# Patient Record
Sex: Male | Born: 1935 | Race: White | Hispanic: No | Marital: Married | Smoking: Former smoker
Health system: Southern US, Community
[De-identification: ages and names within clinical notes are randomized; demographics above are authoritative.]

## PROBLEM LIST (undated history)

## (undated) DIAGNOSIS — C8590 Non-Hodgkin lymphoma, unspecified, unspecified site: Secondary | ICD-10-CM

## (undated) DIAGNOSIS — M1990 Unspecified osteoarthritis, unspecified site: Secondary | ICD-10-CM

## (undated) DIAGNOSIS — I10 Essential (primary) hypertension: Secondary | ICD-10-CM

## (undated) DIAGNOSIS — Z85118 Personal history of other malignant neoplasm of bronchus and lung: Secondary | ICD-10-CM

## (undated) DIAGNOSIS — G459 Transient cerebral ischemic attack, unspecified: Secondary | ICD-10-CM

## (undated) DIAGNOSIS — I4891 Unspecified atrial fibrillation: Secondary | ICD-10-CM

## (undated) HISTORY — PX: HERNIA REPAIR: SHX51

## (undated) HISTORY — PX: BACK SURGERY: SHX140

## (undated) HISTORY — PX: REPLACEMENT TOTAL KNEE BILATERAL: SUR1225

## (undated) HISTORY — PX: CHOLECYSTECTOMY: SHX55

## (undated) HISTORY — PX: ROTATOR CUFF REPAIR: SHX139

## (undated) HISTORY — PX: PNEUMONECTOMY: SHX168

---

## 1998-12-01 ENCOUNTER — Other Ambulatory Visit: Admission: RE | Admit: 1998-12-01 | Discharge: 1998-12-01 | Payer: Self-pay | Admitting: Orthopedic Surgery

## 2001-09-05 ENCOUNTER — Encounter: Admission: RE | Admit: 2001-09-05 | Discharge: 2001-09-05 | Payer: Self-pay | Admitting: Gastroenterology

## 2001-09-05 ENCOUNTER — Encounter: Payer: Self-pay | Admitting: Gastroenterology

## 2003-07-08 ENCOUNTER — Encounter: Payer: Self-pay | Admitting: Gastroenterology

## 2003-07-08 ENCOUNTER — Inpatient Hospital Stay (HOSPITAL_COMMUNITY): Admission: AD | Admit: 2003-07-08 | Discharge: 2003-07-10 | Payer: Self-pay | Admitting: Gastroenterology

## 2004-06-25 ENCOUNTER — Encounter: Admission: RE | Admit: 2004-06-25 | Discharge: 2004-06-25 | Payer: Self-pay | Admitting: Gastroenterology

## 2005-03-08 ENCOUNTER — Encounter: Admission: RE | Admit: 2005-03-08 | Discharge: 2005-03-08 | Payer: Self-pay | Admitting: Internal Medicine

## 2005-04-26 ENCOUNTER — Encounter: Admission: RE | Admit: 2005-04-26 | Discharge: 2005-04-26 | Payer: Self-pay | Admitting: Internal Medicine

## 2005-05-05 ENCOUNTER — Ambulatory Visit (HOSPITAL_COMMUNITY): Admission: RE | Admit: 2005-05-05 | Discharge: 2005-05-05 | Payer: Self-pay | Admitting: General Surgery

## 2005-05-05 ENCOUNTER — Encounter (INDEPENDENT_AMBULATORY_CARE_PROVIDER_SITE_OTHER): Payer: Self-pay | Admitting: General Surgery

## 2005-05-05 ENCOUNTER — Encounter (INDEPENDENT_AMBULATORY_CARE_PROVIDER_SITE_OTHER): Payer: Self-pay | Admitting: *Deleted

## 2005-05-23 ENCOUNTER — Encounter (INDEPENDENT_AMBULATORY_CARE_PROVIDER_SITE_OTHER): Payer: Self-pay | Admitting: General Surgery

## 2005-05-24 ENCOUNTER — Inpatient Hospital Stay (HOSPITAL_COMMUNITY): Admission: RE | Admit: 2005-05-24 | Discharge: 2005-05-31 | Payer: Self-pay | Admitting: General Surgery

## 2005-05-24 ENCOUNTER — Encounter (INDEPENDENT_AMBULATORY_CARE_PROVIDER_SITE_OTHER): Payer: Self-pay | Admitting: *Deleted

## 2005-05-25 ENCOUNTER — Encounter (INDEPENDENT_AMBULATORY_CARE_PROVIDER_SITE_OTHER): Payer: Self-pay | Admitting: Cardiology

## 2005-05-29 ENCOUNTER — Ambulatory Visit: Payer: Self-pay | Admitting: Internal Medicine

## 2005-06-03 ENCOUNTER — Ambulatory Visit: Payer: Self-pay | Admitting: Hematology & Oncology

## 2005-07-26 ENCOUNTER — Ambulatory Visit: Payer: Self-pay | Admitting: Hematology & Oncology

## 2005-07-28 ENCOUNTER — Encounter: Admission: RE | Admit: 2005-07-28 | Discharge: 2005-07-28 | Payer: Self-pay | Admitting: Gastroenterology

## 2005-08-12 ENCOUNTER — Ambulatory Visit (HOSPITAL_COMMUNITY): Admission: RE | Admit: 2005-08-12 | Discharge: 2005-08-12 | Payer: Self-pay | Admitting: Hematology & Oncology

## 2005-08-18 ENCOUNTER — Other Ambulatory Visit: Admission: RE | Admit: 2005-08-18 | Discharge: 2005-08-18 | Payer: Self-pay | Admitting: Hematology & Oncology

## 2005-08-18 ENCOUNTER — Encounter: Payer: Self-pay | Admitting: Hematology & Oncology

## 2005-09-09 ENCOUNTER — Ambulatory Visit (HOSPITAL_COMMUNITY): Admission: RE | Admit: 2005-09-09 | Discharge: 2005-09-09 | Payer: Self-pay | Admitting: Hematology & Oncology

## 2005-09-10 ENCOUNTER — Ambulatory Visit: Payer: Self-pay | Admitting: Hematology & Oncology

## 2005-11-04 ENCOUNTER — Encounter (INDEPENDENT_AMBULATORY_CARE_PROVIDER_SITE_OTHER): Payer: Self-pay | Admitting: *Deleted

## 2005-11-04 ENCOUNTER — Ambulatory Visit (HOSPITAL_COMMUNITY): Admission: RE | Admit: 2005-11-04 | Discharge: 2005-11-04 | Payer: Self-pay | Admitting: Hematology & Oncology

## 2005-11-05 ENCOUNTER — Ambulatory Visit (HOSPITAL_COMMUNITY): Admission: RE | Admit: 2005-11-05 | Discharge: 2005-11-05 | Payer: Self-pay | Admitting: Hematology & Oncology

## 2005-11-11 ENCOUNTER — Ambulatory Visit: Payer: Self-pay | Admitting: Hematology & Oncology

## 2005-12-21 ENCOUNTER — Emergency Department (HOSPITAL_COMMUNITY): Admission: EM | Admit: 2005-12-21 | Discharge: 2005-12-22 | Payer: Self-pay | Admitting: Emergency Medicine

## 2005-12-30 ENCOUNTER — Ambulatory Visit: Payer: Self-pay | Admitting: Hematology & Oncology

## 2006-01-05 ENCOUNTER — Ambulatory Visit (HOSPITAL_COMMUNITY): Admission: RE | Admit: 2006-01-05 | Discharge: 2006-01-05 | Payer: Self-pay | Admitting: Hematology & Oncology

## 2006-01-11 ENCOUNTER — Encounter (INDEPENDENT_AMBULATORY_CARE_PROVIDER_SITE_OTHER): Payer: Self-pay | Admitting: *Deleted

## 2006-01-11 ENCOUNTER — Ambulatory Visit (HOSPITAL_COMMUNITY): Admission: RE | Admit: 2006-01-11 | Discharge: 2006-01-11 | Payer: Self-pay | Admitting: Hematology & Oncology

## 2006-01-11 ENCOUNTER — Ambulatory Visit: Payer: Self-pay | Admitting: Hematology & Oncology

## 2006-02-10 LAB — COMPREHENSIVE METABOLIC PANEL
ALT: 10 U/L (ref 0–40)
BUN: 8 mg/dL (ref 6–23)
CO2: 30 mEq/L (ref 19–32)
Calcium: 9.3 mg/dL (ref 8.4–10.5)
Chloride: 104 mEq/L (ref 96–112)
Creatinine, Ser: 0.6 mg/dL (ref 0.4–1.5)
Glucose, Bld: 99 mg/dL (ref 70–99)
Total Bilirubin: 0.7 mg/dL (ref 0.3–1.2)

## 2006-02-10 LAB — CBC WITH DIFFERENTIAL/PLATELET
Eosinophils Absolute: 0.2 10*3/uL (ref 0.0–0.5)
MONO#: 0.5 10*3/uL (ref 0.1–0.9)
MONO%: 12.3 % (ref 0.0–13.0)
NEUT#: 2.3 10*3/uL (ref 1.5–6.5)
RBC: 4.01 10*6/uL — ABNORMAL LOW (ref 4.20–5.71)
RDW: 17 % — ABNORMAL HIGH (ref 11.2–14.6)
WBC: 3.9 10*3/uL — ABNORMAL LOW (ref 4.0–10.0)
lymph#: 0.9 10*3/uL (ref 0.9–3.3)

## 2006-02-10 LAB — ERYTHROCYTE SEDIMENTATION RATE: Sed Rate: 36 mm/hr — ABNORMAL HIGH (ref 0–20)

## 2006-02-10 LAB — CHCC SMEAR

## 2006-02-10 LAB — LACTATE DEHYDROGENASE: LDH: 147 U/L (ref 94–250)

## 2006-03-08 ENCOUNTER — Ambulatory Visit: Payer: Self-pay | Admitting: Hematology & Oncology

## 2006-03-24 LAB — COMPREHENSIVE METABOLIC PANEL
Alkaline Phosphatase: 78 U/L (ref 39–117)
CO2: 29 mEq/L (ref 19–32)
Creatinine, Ser: 0.7 mg/dL (ref 0.4–1.5)
Glucose, Bld: 106 mg/dL — ABNORMAL HIGH (ref 70–99)
Total Bilirubin: 0.5 mg/dL (ref 0.3–1.2)

## 2006-03-24 LAB — CBC WITH DIFFERENTIAL/PLATELET
Basophils Absolute: 0 10*3/uL (ref 0.0–0.1)
EOS%: 4.6 % (ref 0.0–7.0)
HCT: 37.8 % — ABNORMAL LOW (ref 38.7–49.9)
HGB: 12.9 g/dL — ABNORMAL LOW (ref 13.0–17.1)
MCH: 31.3 pg (ref 28.0–33.4)
MONO#: 0.5 10*3/uL (ref 0.1–0.9)
NEUT#: 1.2 10*3/uL — ABNORMAL LOW (ref 1.5–6.5)
RDW: 16 % — ABNORMAL HIGH (ref 11.2–14.6)
WBC: 2.7 10*3/uL — ABNORMAL LOW (ref 4.0–10.0)
lymph#: 0.9 10*3/uL (ref 0.9–3.3)

## 2006-03-24 LAB — LACTATE DEHYDROGENASE: LDH: 153 U/L (ref 94–250)

## 2006-04-07 ENCOUNTER — Ambulatory Visit (HOSPITAL_COMMUNITY): Admission: RE | Admit: 2006-04-07 | Discharge: 2006-04-07 | Payer: Self-pay | Admitting: Hematology & Oncology

## 2006-04-22 LAB — CBC WITH DIFFERENTIAL/PLATELET
Basophils Absolute: 0 10*3/uL (ref 0.0–0.1)
Eosinophils Absolute: 0.2 10*3/uL (ref 0.0–0.5)
HCT: 42.2 % (ref 38.7–49.9)
LYMPH%: 18.1 % (ref 14.0–48.0)
MCV: 94.1 fL (ref 81.6–98.0)
MONO#: 0.5 10*3/uL (ref 0.1–0.9)
MONO%: 8.8 % (ref 0.0–13.0)
NEUT#: 3.7 10*3/uL (ref 1.5–6.5)
NEUT%: 69.1 % (ref 40.0–75.0)
Platelets: 166 10*3/uL (ref 145–400)
WBC: 5.4 10*3/uL (ref 4.0–10.0)

## 2006-04-22 LAB — COMPREHENSIVE METABOLIC PANEL
ALT: 8 U/L (ref 0–40)
BUN: 8 mg/dL (ref 6–23)
CO2: 29 mEq/L (ref 19–32)
Calcium: 8.8 mg/dL (ref 8.4–10.5)
Chloride: 104 mEq/L (ref 96–112)
Creatinine, Ser: 0.73 mg/dL (ref 0.40–1.50)
Glucose, Bld: 108 mg/dL — ABNORMAL HIGH (ref 70–99)
Total Bilirubin: 0.6 mg/dL (ref 0.3–1.2)

## 2006-04-22 LAB — LACTATE DEHYDROGENASE: LDH: 145 U/L (ref 94–250)

## 2006-05-02 ENCOUNTER — Ambulatory Visit: Payer: Self-pay | Admitting: Hematology & Oncology

## 2006-06-03 LAB — COMPREHENSIVE METABOLIC PANEL
ALT: 10 U/L (ref 0–40)
BUN: 10 mg/dL (ref 6–23)
CO2: 28 mEq/L (ref 19–32)
Calcium: 8.7 mg/dL (ref 8.4–10.5)
Chloride: 105 mEq/L (ref 96–112)
Creatinine, Ser: 0.79 mg/dL (ref 0.40–1.50)
Total Bilirubin: 0.6 mg/dL (ref 0.3–1.2)

## 2006-06-03 LAB — LACTATE DEHYDROGENASE: LDH: 218 U/L (ref 94–250)

## 2006-06-03 LAB — CBC WITH DIFFERENTIAL/PLATELET
BASO%: 0.4 % (ref 0.0–2.0)
Basophils Absolute: 0 10*3/uL (ref 0.0–0.1)
HCT: 40.4 % (ref 38.7–49.9)
HGB: 13.9 g/dL (ref 13.0–17.1)
LYMPH%: 41.2 % (ref 14.0–48.0)
MCH: 31.7 pg (ref 28.0–33.4)
MCHC: 34.3 g/dL (ref 32.0–35.9)
MONO#: 0.5 10*3/uL (ref 0.1–0.9)
NEUT%: 33.6 % — ABNORMAL LOW (ref 40.0–75.0)
Platelets: 156 10*3/uL (ref 145–400)
WBC: 2.6 10*3/uL — ABNORMAL LOW (ref 4.0–10.0)

## 2006-06-24 ENCOUNTER — Ambulatory Visit: Payer: Self-pay | Admitting: Hematology & Oncology

## 2006-07-15 ENCOUNTER — Ambulatory Visit (HOSPITAL_COMMUNITY): Admission: RE | Admit: 2006-07-15 | Discharge: 2006-07-15 | Payer: Self-pay | Admitting: Hematology & Oncology

## 2006-07-29 LAB — COMPREHENSIVE METABOLIC PANEL
ALT: 10 U/L (ref 0–40)
Albumin: 4.5 g/dL (ref 3.5–5.2)
CO2: 30 mEq/L (ref 19–32)
Calcium: 9.2 mg/dL (ref 8.4–10.5)
Chloride: 104 mEq/L (ref 96–112)
Glucose, Bld: 98 mg/dL (ref 70–99)
Potassium: 4.3 mEq/L (ref 3.5–5.3)
Sodium: 142 mEq/L (ref 135–145)
Total Bilirubin: 0.7 mg/dL (ref 0.3–1.2)
Total Protein: 7 g/dL (ref 6.0–8.3)

## 2006-07-29 LAB — CBC WITH DIFFERENTIAL/PLATELET
BASO%: 0.1 % (ref 0.0–2.0)
Eosinophils Absolute: 0.2 10*3/uL (ref 0.0–0.5)
LYMPH%: 23.1 % (ref 14.0–48.0)
MONO#: 0.6 10*3/uL (ref 0.1–0.9)
NEUT#: 3.4 10*3/uL (ref 1.5–6.5)
Platelets: 168 10*3/uL (ref 145–400)
RBC: 4.47 10*6/uL (ref 4.20–5.71)
WBC: 5.4 10*3/uL (ref 4.0–10.0)
lymph#: 1.2 10*3/uL (ref 0.9–3.3)

## 2006-08-09 ENCOUNTER — Ambulatory Visit: Payer: Self-pay | Admitting: Hematology & Oncology

## 2006-09-20 ENCOUNTER — Ambulatory Visit: Payer: Self-pay | Admitting: Hematology & Oncology

## 2006-10-27 ENCOUNTER — Ambulatory Visit (HOSPITAL_COMMUNITY): Admission: RE | Admit: 2006-10-27 | Discharge: 2006-10-27 | Payer: Self-pay | Admitting: Hematology & Oncology

## 2006-10-31 ENCOUNTER — Ambulatory Visit: Payer: Self-pay | Admitting: Hematology & Oncology

## 2006-11-03 LAB — CBC WITH DIFFERENTIAL/PLATELET
BASO%: 0.2 % (ref 0.0–2.0)
Basophils Absolute: 0 10*3/uL (ref 0.0–0.1)
HCT: 43.2 % (ref 38.7–49.9)
HGB: 14.8 g/dL (ref 13.0–17.1)
MCHC: 34.2 g/dL (ref 32.0–35.9)
MONO#: 0.5 10*3/uL (ref 0.1–0.9)
NEUT%: 62.3 % (ref 40.0–75.0)
RDW: 13.9 % (ref 11.2–14.6)
WBC: 5.4 10*3/uL (ref 4.0–10.0)
lymph#: 1.4 10*3/uL (ref 0.9–3.3)

## 2006-11-03 LAB — LACTATE DEHYDROGENASE: LDH: 173 U/L (ref 94–250)

## 2006-12-13 ENCOUNTER — Ambulatory Visit: Payer: Self-pay | Admitting: Hematology & Oncology

## 2007-01-31 ENCOUNTER — Ambulatory Visit: Payer: Self-pay | Admitting: Hematology & Oncology

## 2007-02-23 ENCOUNTER — Ambulatory Visit (HOSPITAL_COMMUNITY): Admission: RE | Admit: 2007-02-23 | Discharge: 2007-02-23 | Payer: Self-pay | Admitting: Hematology & Oncology

## 2007-03-09 LAB — CBC WITH DIFFERENTIAL/PLATELET
Basophils Absolute: 0 10*3/uL (ref 0.0–0.1)
EOS%: 2.7 % (ref 0.0–7.0)
Eosinophils Absolute: 0.2 10*3/uL (ref 0.0–0.5)
HCT: 39.7 % (ref 38.7–49.9)
HGB: 14.2 g/dL (ref 13.0–17.1)
MCH: 33.9 pg — ABNORMAL HIGH (ref 28.0–33.4)
MONO#: 0.5 10*3/uL (ref 0.1–0.9)
NEUT#: 3.6 10*3/uL (ref 1.5–6.5)
NEUT%: 63.3 % (ref 40.0–75.0)
RDW: 13 % (ref 11.2–14.6)
WBC: 5.6 10*3/uL (ref 4.0–10.0)
lymph#: 1.4 10*3/uL (ref 0.9–3.3)

## 2007-03-09 LAB — COMPREHENSIVE METABOLIC PANEL
AST: 28 U/L (ref 0–37)
Albumin: 4.4 g/dL (ref 3.5–5.2)
BUN: 10 mg/dL (ref 6–23)
CO2: 28 mEq/L (ref 19–32)
Calcium: 9.3 mg/dL (ref 8.4–10.5)
Chloride: 104 mEq/L (ref 96–112)
Creatinine, Ser: 0.76 mg/dL (ref 0.40–1.50)
Potassium: 4.2 mEq/L (ref 3.5–5.3)

## 2007-03-09 LAB — LACTATE DEHYDROGENASE: LDH: 162 U/L (ref 94–250)

## 2007-04-18 ENCOUNTER — Ambulatory Visit: Payer: Self-pay | Admitting: Hematology & Oncology

## 2007-08-01 ENCOUNTER — Ambulatory Visit: Payer: Self-pay | Admitting: Hematology & Oncology

## 2007-08-31 ENCOUNTER — Ambulatory Visit (HOSPITAL_COMMUNITY): Admission: RE | Admit: 2007-08-31 | Discharge: 2007-08-31 | Payer: Self-pay | Admitting: Hematology & Oncology

## 2007-09-07 LAB — LACTATE DEHYDROGENASE: LDH: 161 U/L (ref 94–250)

## 2007-09-07 LAB — COMPREHENSIVE METABOLIC PANEL
BUN: 16 mg/dL (ref 6–23)
CO2: 26 mEq/L (ref 19–32)
Calcium: 9.1 mg/dL (ref 8.4–10.5)
Chloride: 105 mEq/L (ref 96–112)
Creatinine, Ser: 0.79 mg/dL (ref 0.40–1.50)
Glucose, Bld: 95 mg/dL (ref 70–99)

## 2007-09-07 LAB — CBC WITH DIFFERENTIAL/PLATELET
Basophils Absolute: 0 10*3/uL (ref 0.0–0.1)
EOS%: 2 % (ref 0.0–7.0)
HCT: 39.7 % (ref 38.7–49.9)
HGB: 13.9 g/dL (ref 13.0–17.1)
MCH: 33.2 pg (ref 28.0–33.4)
MONO#: 0.4 10*3/uL (ref 0.1–0.9)
NEUT%: 55.7 % (ref 40.0–75.0)
lymph#: 1.8 10*3/uL (ref 0.9–3.3)

## 2007-09-07 LAB — TESTOSTERONE: Testosterone: 169.73 ng/dL — ABNORMAL LOW (ref 350–890)

## 2007-09-13 ENCOUNTER — Ambulatory Visit (HOSPITAL_COMMUNITY): Admission: RE | Admit: 2007-09-13 | Discharge: 2007-09-13 | Payer: Self-pay | Admitting: Hematology & Oncology

## 2007-09-25 ENCOUNTER — Encounter: Payer: Self-pay | Admitting: Urology

## 2007-09-25 ENCOUNTER — Inpatient Hospital Stay (HOSPITAL_COMMUNITY): Admission: RE | Admit: 2007-09-25 | Discharge: 2007-09-27 | Payer: Self-pay | Admitting: Urology

## 2008-03-13 ENCOUNTER — Ambulatory Visit (HOSPITAL_COMMUNITY): Admission: RE | Admit: 2008-03-13 | Discharge: 2008-03-13 | Payer: Self-pay | Admitting: Hematology & Oncology

## 2008-03-19 ENCOUNTER — Ambulatory Visit: Payer: Self-pay | Admitting: Hematology & Oncology

## 2008-04-08 ENCOUNTER — Encounter: Admission: RE | Admit: 2008-04-08 | Discharge: 2008-04-08 | Payer: Self-pay | Admitting: Gastroenterology

## 2008-09-16 ENCOUNTER — Ambulatory Visit (HOSPITAL_COMMUNITY): Admission: RE | Admit: 2008-09-16 | Discharge: 2008-09-16 | Payer: Self-pay | Admitting: Gastroenterology

## 2008-09-16 ENCOUNTER — Ambulatory Visit: Payer: Self-pay | Admitting: Vascular Surgery

## 2008-09-16 ENCOUNTER — Encounter (INDEPENDENT_AMBULATORY_CARE_PROVIDER_SITE_OTHER): Payer: Self-pay | Admitting: Gastroenterology

## 2008-09-16 ENCOUNTER — Ambulatory Visit (HOSPITAL_COMMUNITY): Admission: RE | Admit: 2008-09-16 | Discharge: 2008-09-16 | Payer: Self-pay | Admitting: Hematology & Oncology

## 2008-09-18 ENCOUNTER — Ambulatory Visit: Payer: Self-pay | Admitting: Hematology & Oncology

## 2008-09-19 LAB — COMPREHENSIVE METABOLIC PANEL
Alkaline Phosphatase: 43 U/L (ref 39–117)
BUN: 14 mg/dL (ref 6–23)
Glucose, Bld: 82 mg/dL (ref 70–99)
Total Bilirubin: 0.6 mg/dL (ref 0.3–1.2)

## 2008-09-19 LAB — CBC WITH DIFFERENTIAL (CANCER CENTER ONLY)
BASO#: 0 10*3/uL (ref 0.0–0.2)
BASO%: 0.5 % (ref 0.0–2.0)
HCT: 43.3 % (ref 38.7–49.9)
HGB: 14.8 g/dL (ref 13.0–17.1)
LYMPH#: 1.9 10*3/uL (ref 0.9–3.3)
MONO#: 0.5 10*3/uL (ref 0.1–0.9)
NEUT#: 3.5 10*3/uL (ref 1.5–6.5)
NEUT%: 57.8 % (ref 40.0–80.0)
RDW: 12.1 % (ref 10.5–14.6)
WBC: 6.1 10*3/uL (ref 4.0–10.0)

## 2008-09-19 LAB — TESTOSTERONE: Testosterone: 187.12 ng/dL — ABNORMAL LOW (ref 350–890)

## 2008-09-19 LAB — PSA: PSA: 0.5 ng/mL (ref 0.10–4.00)

## 2009-03-10 ENCOUNTER — Ambulatory Visit (HOSPITAL_COMMUNITY): Admission: RE | Admit: 2009-03-10 | Discharge: 2009-03-10 | Payer: Self-pay | Admitting: Hematology & Oncology

## 2009-03-19 ENCOUNTER — Ambulatory Visit: Payer: Self-pay | Admitting: Hematology & Oncology

## 2009-03-20 LAB — COMPREHENSIVE METABOLIC PANEL
ALT: 36 U/L (ref 0–53)
Albumin: 4.2 g/dL (ref 3.5–5.2)
CO2: 28 mEq/L (ref 19–32)
Calcium: 9.1 mg/dL (ref 8.4–10.5)
Chloride: 104 mEq/L (ref 96–112)
Creatinine, Ser: 0.87 mg/dL (ref 0.40–1.50)
Sodium: 140 mEq/L (ref 135–145)
Total Protein: 7 g/dL (ref 6.0–8.3)

## 2009-03-20 LAB — LACTATE DEHYDROGENASE: LDH: 193 U/L (ref 94–250)

## 2009-03-20 LAB — CBC WITH DIFFERENTIAL (CANCER CENTER ONLY)
BASO%: 0.4 % (ref 0.0–2.0)
EOS%: 2.2 % (ref 0.0–7.0)
LYMPH#: 1.9 10*3/uL (ref 0.9–3.3)
MONO#: 0.4 10*3/uL (ref 0.1–0.9)
Platelets: 152 10*3/uL (ref 145–400)
RDW: 11.3 % (ref 10.5–14.6)
WBC: 5.2 10*3/uL (ref 4.0–10.0)

## 2009-06-06 ENCOUNTER — Encounter: Admission: RE | Admit: 2009-06-06 | Discharge: 2009-06-06 | Payer: Self-pay | Admitting: Gastroenterology

## 2009-08-27 ENCOUNTER — Inpatient Hospital Stay (HOSPITAL_COMMUNITY): Admission: EM | Admit: 2009-08-27 | Discharge: 2009-08-28 | Payer: Self-pay | Admitting: Emergency Medicine

## 2009-08-28 ENCOUNTER — Encounter (INDEPENDENT_AMBULATORY_CARE_PROVIDER_SITE_OTHER): Payer: Self-pay | Admitting: Gastroenterology

## 2009-09-15 ENCOUNTER — Ambulatory Visit (HOSPITAL_COMMUNITY): Admission: RE | Admit: 2009-09-15 | Discharge: 2009-09-15 | Payer: Self-pay | Admitting: Hematology & Oncology

## 2009-09-19 ENCOUNTER — Ambulatory Visit: Payer: Self-pay | Admitting: Hematology & Oncology

## 2009-09-22 LAB — CBC WITH DIFFERENTIAL (CANCER CENTER ONLY)
BASO#: 0 10*3/uL (ref 0.0–0.2)
BASO%: 0.4 % (ref 0.0–2.0)
EOS%: 2.2 % (ref 0.0–7.0)
Eosinophils Absolute: 0.1 10*3/uL (ref 0.0–0.5)
HCT: 39.5 % (ref 38.7–49.9)
HGB: 13.8 g/dL (ref 13.0–17.1)
LYMPH#: 1.7 10*3/uL (ref 0.9–3.3)
LYMPH%: 32.5 % (ref 14.0–48.0)
MCH: 32.8 pg (ref 28.0–33.4)
MCHC: 35 g/dL (ref 32.0–35.9)
MCV: 94 fL (ref 82–98)
MONO#: 0.3 10*3/uL (ref 0.1–0.9)
MONO%: 6.4 % (ref 0.0–13.0)
NEUT#: 3 10*3/uL (ref 1.5–6.5)
NEUT%: 58.5 % (ref 40.0–80.0)
Platelets: 155 10*3/uL (ref 145–400)
RBC: 4.21 10*6/uL (ref 4.20–5.70)
RDW: 11.7 % (ref 10.5–14.6)
WBC: 5.1 10*3/uL (ref 4.0–10.0)

## 2009-09-23 LAB — COMPREHENSIVE METABOLIC PANEL
Alkaline Phosphatase: 42 U/L (ref 39–117)
Glucose, Bld: 130 mg/dL — ABNORMAL HIGH (ref 70–99)
Sodium: 141 mEq/L (ref 135–145)
Total Bilirubin: 0.6 mg/dL (ref 0.3–1.2)
Total Protein: 7.1 g/dL (ref 6.0–8.3)

## 2009-09-23 LAB — VITAMIN D 25 HYDROXY (VIT D DEFICIENCY, FRACTURES): Vit D, 25-Hydroxy: 24 ng/mL — ABNORMAL LOW (ref 30–89)

## 2010-04-14 ENCOUNTER — Encounter: Admission: RE | Admit: 2010-04-14 | Discharge: 2010-04-14 | Payer: Self-pay | Admitting: Gastroenterology

## 2010-06-24 ENCOUNTER — Ambulatory Visit: Payer: Self-pay | Admitting: Hematology & Oncology

## 2010-06-25 LAB — CBC WITH DIFFERENTIAL (CANCER CENTER ONLY)
Eosinophils Absolute: 0.1 10*3/uL (ref 0.0–0.5)
HCT: 43.3 % (ref 38.7–49.9)
HGB: 14.6 g/dL (ref 13.0–17.1)
LYMPH#: 2 10*3/uL (ref 0.9–3.3)
LYMPH%: 33.4 % (ref 14.0–48.0)
MCHC: 33.8 g/dL (ref 32.0–35.9)
MCV: 98 fL (ref 82–98)
MONO%: 6.2 % (ref 0.0–13.0)
Platelets: 158 10*3/uL (ref 145–400)
RDW: 11.1 % (ref 10.5–14.6)
WBC: 5.9 10*3/uL (ref 4.0–10.0)

## 2010-06-26 LAB — COMPREHENSIVE METABOLIC PANEL
ALT: 25 U/L (ref 0–53)
AST: 39 U/L — ABNORMAL HIGH (ref 0–37)
Alkaline Phosphatase: 40 U/L (ref 39–117)
Calcium: 9.5 mg/dL (ref 8.4–10.5)
Chloride: 105 mEq/L (ref 96–112)
Creatinine, Ser: 1.06 mg/dL (ref 0.40–1.50)
Glucose, Bld: 124 mg/dL — ABNORMAL HIGH (ref 70–99)
Potassium: 4.6 mEq/L (ref 3.5–5.3)
Total Protein: 7.2 g/dL (ref 6.0–8.3)

## 2010-07-02 ENCOUNTER — Ambulatory Visit (HOSPITAL_COMMUNITY): Admission: RE | Admit: 2010-07-02 | Discharge: 2010-07-02 | Payer: Self-pay | Admitting: Hematology & Oncology

## 2010-11-29 ENCOUNTER — Encounter: Payer: Self-pay | Admitting: Gastroenterology

## 2011-02-10 LAB — GLUCOSE, CAPILLARY: Glucose-Capillary: 105 mg/dL — ABNORMAL HIGH (ref 70–99)

## 2011-02-11 LAB — COMPREHENSIVE METABOLIC PANEL
ALT: 39 U/L (ref 0–53)
AST: 49 U/L — ABNORMAL HIGH (ref 0–37)
Alkaline Phosphatase: 43 U/L (ref 39–117)
CO2: 29 mEq/L (ref 19–32)
Calcium: 8.8 mg/dL (ref 8.4–10.5)
Chloride: 104 mEq/L (ref 96–112)
GFR calc Af Amer: >60 mL/min (ref >60)
GFR calc non Af Amer: >60 mL/min (ref >60)
Potassium: 4 mEq/L (ref 3.5–5.1)
Sodium: 138 mEq/L (ref 135–145)
Total Bilirubin: 1 mg/dL (ref 0.3–1.2)

## 2011-02-11 LAB — BASIC METABOLIC PANEL
Calcium: 9 mg/dL (ref 8.4–10.5)
Creatinine, Ser: 0.82 mg/dL (ref 0.4–1.5)
GFR calc Af Amer: >60 mL/min (ref >60)
GFR calc non Af Amer: >60 mL/min (ref >60)
Glucose, Bld: 103 mg/dL — ABNORMAL HIGH (ref 70–99)
Potassium: 4.3 mEq/L (ref 3.5–5.1)
Sodium: 138 mEq/L (ref 135–145)

## 2011-02-11 LAB — CBC
HCT: 38 % — ABNORMAL LOW (ref 39.0–52.0)
HCT: 38.9 % — ABNORMAL LOW (ref 39.0–52.0)
Hemoglobin: 13.3 g/dL (ref 13.0–17.0)
Hemoglobin: 13.4 g/dL (ref 13.0–17.0)
MCHC: 35.1 g/dL (ref 30.0–36.0)
MCV: 97.1 fL (ref 78.0–100.0)
Platelets: 147 10*3/uL — ABNORMAL LOW (ref 150–400)
RBC: 4.02 MIL/uL — ABNORMAL LOW (ref 4.22–5.81)
WBC: 5.3 10*3/uL (ref 4.0–10.5)
WBC: 5.5 10*3/uL (ref 4.0–10.5)

## 2011-02-11 LAB — LIPID PANEL
Cholesterol: 206 mg/dL — ABNORMAL HIGH (ref 0–200)
HDL: 37 mg/dL — ABNORMAL LOW (ref >39)
Total CHOL/HDL Ratio: 5.6 RATIO

## 2011-02-11 LAB — DIFFERENTIAL
Basophils Relative: 0 % (ref 0–1)
Eosinophils Relative: 2 % (ref 0–5)

## 2011-03-09 ENCOUNTER — Other Ambulatory Visit: Payer: Self-pay | Admitting: Gastroenterology

## 2011-03-09 DIAGNOSIS — G44009 Cluster headache syndrome, unspecified, not intractable: Secondary | ICD-10-CM

## 2011-03-09 DIAGNOSIS — C8590 Non-Hodgkin lymphoma, unspecified, unspecified site: Secondary | ICD-10-CM

## 2011-03-15 ENCOUNTER — Ambulatory Visit
Admission: RE | Admit: 2011-03-15 | Discharge: 2011-03-15 | Disposition: A | Discharge status: Home / Self Care | Payer: Medicare Other | Source: Ambulatory Visit | Attending: Gastroenterology | Admitting: Gastroenterology

## 2011-03-15 DIAGNOSIS — C8590 Non-Hodgkin lymphoma, unspecified, unspecified site: Secondary | ICD-10-CM

## 2011-03-15 DIAGNOSIS — G44009 Cluster headache syndrome, unspecified, not intractable: Secondary | ICD-10-CM

## 2011-03-15 MED ORDER — GADOBENATE DIMEGLUMINE 529 MG/ML IV SOLN
20.0000 mL | Freq: Once | INTRAVENOUS | Status: AC | PRN
  Administered 2011-03-15: 20 mL via INTRAVENOUS

## 2011-03-23 NOTE — Op Note (Signed)
NAMEDANDRE, SISLER NO.:  [id number]   MEDICAL RECORD NO.:  [id number]          PATIENT TYPE:  OIB   LOCATION:  0098                         FACILITY:  [facility]   PHYSICIAN:  Bertram Millard. Dahlstedt, M.D.DATE OF BIRTH:  [birth year]   DATE OF PROCEDURE:  09/25/2007  DATE OF DISCHARGE:                               OPERATIVE REPORT   PREOPERATIVE DIAGNOSIS:  Benign prostatic hypertrophy.   POSTOPERATIVE DIAGNOSIS:  Benign prostatic hypertrophy.   PROCEDURE:  Transurethral prostatic resection.   SURGEON:  Bertram Millard. Dahlstedt, M.D.   ANESTHESIA:  Subarachnoid block.   COMPLICATIONS:  None.   SPECIMENS:  Prostate sent to pathology.   BRIEF HISTORY:  A 77 year old male who presents for TURP for significant  BPH symptoms.  He does not like medications, and symptoms have become so  bad that he desires surgical management.  He has chosen a TURP at my  suggestion over office-based microwave.  He is aware of risks and  complications of the procedure.  He has decided to proceed.   DESCRIPTION OF PROCEDURE:  The patient was identified in the holding  area and preoperative IV antibiotics were administered.  He was given a  spinal block and then placed in the dorsal lithotomy position on the  table.  Genitalia and perineum were prepped and draped.  Urethra was  calibrated to 32 [place] with [employer] sounds.  The resectoscope was  then placed.  The ureteral orifices were identified.  His bladder was  inspected and was normal except for significant trabeculations.  The  median lobe was then taken down first, from the bladder neck to the  verumontanum, leaving the veru as a landmark.  Anterior tissue was then  resected from the 11 o'clock to the 1 o'clock position along the length  of the prostate.  The right and left lateral lobes were then resected  down to the surgical capsule.  A wide open prostatic fossa was then  established.  Small bleeders were electrocoagulated.   Apical tissue was  then trimmed posteriorly.  Again, hemostasis was achieved.  The bladder  was then irrigated and all chips removed.  They were sent for permanent  specimen.  The prostatic fossa was then again inspected.  There was  excellent hemostasis and at this point a 22 [place] three-way Foley  catheter was then placed on traction.  The patient tolerated the  procedure well.  He was then taken to the PACU in stable condition.      Bertram Millard. Dahlstedt, M.D.  Electronically Signed    SMD/MEDQ  D:  09/25/2007  T:  09/25/2007  Job:  [id number]

## 2011-03-23 NOTE — H&P (Signed)
NAMEROI, JAFARI NO.:  [id number]   MEDICAL RECORD NO.:  [id number]          PATIENT TYPE:  OIB   LOCATION:  0098                         FACILITY:  [facility]   PHYSICIAN:  Bertram Millard. Dahlstedt, M.D.DATE OF BIRTH:  [birth year]   DATE OF ADMISSION:  09/25/2007  DATE OF DISCHARGE:                              HISTORY & PHYSICAL   REASON FOR ADMISSION:  Enlarged prostate.   BRIEF HISTORY:  A 77 year old male with significant symptoms of BPH.  His BPH symptom score is well over 20.  He does not want to go with  medical therapy and recently asked my recommendations on procedures for  BPH.  As he does have significant symptomatology, I have recommended  TURP over the less invasive, office based procedures of TUNA and  microwave.  Risks and complications of the TURP have been discussed with  the patient at length.  He desires to proceed.   MEDICATIONS:  He is not currently on any medications.   ALLERGIES:  He is allergic to SULFA.   PAST MEDICAL HISTORY:  1. Significant for atrial fibrillation, not current.  2. History of TIA.  3. Arthritis.  4. Lymphoma.  5. Heart murmur.   PAST SURGICAL HISTORY:  1. Cholecystectomy.  2. Lumbar disc surgery.  3. Bilateral knee arthroplasties.  4. Rotator cuff surgery.  5. Hernia repair.  6. Left pneumonectomy.   SOCIAL HISTORY:  The patient is married.  He has three grown children.  He has a remote history of cigarette use.   REVIEW OF SYSTEMS:  He denies any recent chest pain.  He had mild  depression.  He does have erectile dysfunction.  He has the previously  mentioned urinary symptoms.   FAMILY HISTORY:  Significant for cancer in siblings and hypertension,  otherwise unremarkable.   PHYSICAL EXAMINATION:  GENERAL APPEARANCE:  A pleasant elderly male.  HEENT:  Normal.  NECK:  Supple.  CHEST:  Clear with decreased breath sounds on the left side.  CARDIOVASCULAR:  Regular rate and rhythm.  ABDOMEN:  Flat, soft,  nondistended, nontender.  No mass, no megaly.  Bladder not palpable.  No CVA tenderness, no flank mass.  GU:  He had an uncircumcised phallus without lesions.  Glands and meatus  normal.  Scrotal exam normal.  No inguinal hernias.  RECTAL:  Normal anal sphincter tone.  Glands 2 to 3+ symmetrical, no  nodules, nontender, no rectal mass. Seminal vesicles nonpalpable.  EXTREMITIES:  No peripheral edema.  DP and PT pulses normal both feet.   ADMISSION LABORATORY DATA:  H&H and BMET were normal.  Urinalysis clear.   Chest x-ray:  Changes regarding left pneumonectomy, no active disease.   EKG:  Normal sinus rhythm.   IMPRESSION:  Benign prostatic hypertrophy, significant symptomatology.   PLAN:  Admit for TURP.      Bertram Millard. Dahlstedt, M.D.  Electronically Signed     SMD/MEDQ  D:  09/25/2007  T:  09/25/2007  Job:  [id number]   cc:   Danise Edge, M.D.  Fax: [phone]

## 2011-03-26 NOTE — Discharge Summary (Signed)
NAME:  ZYAD, BOOMER NO.:  [id number]   MEDICAL RECORD NO.:  [id number]          PATIENT TYPE:  INP   LOCATION:  1420                         FACILITY:  [facility]   PHYSICIAN:  Bertram Millard. Dahlstedt, M.D.DATE OF BIRTH:  22-Dec-1927   DATE OF ADMISSION:  09/25/2007  DATE OF DISCHARGE:  09/27/2007                               DISCHARGE SUMMARY   PRINCIPAL PROCEDURE:  On the day of admission, TURP.   PRIMARY DIAGNOSES:  1. Benign prostatic hypertrophy, with obstruction.  2. History of lymphoma.  3. History of hypertension.  4. History of erectile dysfunction.  5. History of congestive heart failure.  6. History of transient ischemic attack.  7. History of tobacco use.   BRIEF HISTORY:  Mr. Winther is a 77 year old male admitted for a TURP.  He has a significant lower urinary tract symptoms score, and does not  want medical therapy.  We have talked several times in the office  regarding intervention, including minimally invasive thermotherapy in  the office, as well as TURP.  I recommended TURP is the best long-term  solution to his lower urinary tract symptoms.  We have discussed risks  and complications of this as well.  He desires to proceed.   PAST MEDICAL HISTORY:  1. Atrial fibrillation.  2. History of TIA.  3. Arthritis.  4. Lymphoma.  5. History of a heart murmur.  6. He has had a cholecystectomy.  7. Lumbar disc surgery.  8. Bilateral knee arthroplasties pain.  9. Rotator cuff surgery.  10.Hernia repair.  11.Left pneumonectomy.   For the remainder of the H&P, please see the dictated version in the  chart.   HOSPITAL COURSE:  Mr. Manas was admitted directly to the operating  room.  He underwent a TURP.  He actually tolerated this procedure well.  13 g of tissue was removed.  He was hooked up to CBI.  His catheter was  removed on September 26, 2007 in the morning.  He was unable to void  after this, and the catheter was replaced later that day.   After his  catheter was replaced, it was slightly bloody, and it was irrigated.  It  was left to drainage overnight.  He voided the next day after the  catheter was removed, and he was discharged at that time.  He was  discharged in improved condition.  He will be sent home on stool  softener as well as Cipro twice daily for 5 days.  Those are the only  medications he will be on.  He will follow up in approximately 2-3 weeks  with me.  He was discharged on a regular diet.      Bertram Millard. Dahlstedt, M.D.  Electronically Signed     SMD/MEDQ  D:  11/13/2007  T:  11/13/2007  Job:  [id number]   cc:   Danise Edge, M.D.  Fax: [phone]

## 2011-03-26 NOTE — Consult Note (Signed)
NAME:  Craig Chapman, Craig Chapman NO.:  [id number]   MEDICAL RECORD NO.:  [id number]          PATIENT TYPE:  INP   LOCATION:  0156                         FACILITY:  [facility]   PHYSICIAN:  Craig Chapman, M.D. DATE OF BIRTH:  1927-11-16   DATE OF CONSULTATION:  05/26/2005  DATE OF DISCHARGE:                                   CONSULTATION   REFERRING PHYSICIAN:  Sigmund I. Patsi Chapman, M.D.   DIAGNOSIS:  Follicular low-grade non-Hodgkin's lymphoma.   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  He has multiple  medical problems.  He had a left thalamic stroke back in August, 2004.  He  seemed to get better from this.  He has a history of hypertension, systolic  heart murmur, paroxysmal atrial fibrillation, chronic low back problems.  Of  note, he states a problem with bowel incontinence for the past year.  He was  having some problems with some upper abdominal/lower chest pain.  He had a  CAT scan done back in Mar 08, 2005 of the chest.  This was negative.  Of  note, he had what appeared to be a small cell lung cancer at the [facility] back  in 1980.  He underwent a left pneumectomy.  He subsequently has been  clear.  The CT scan did not show any evidence of problems.  He continued  to have problems.  He is having pain.   He underwent a CT scan of the abdomen and pelvis.  This was done on April 26, 2005.  This showed bilateral pelvic adenopathy.  He had retrocurl and  retroperitoneal adenopathy.  The largest lymph node measured 3.6 x 2.6 cm.  He had a pelvic mass that appeared to be displaced in the bladder, measuring  4 x 6.5 cm.   He needed to undergo an open laparotomy for diagnosis.  This is done by Dr.  Derrell Chapman on May 24, 2005.  Dr. Retta Chapman assisted.  The pathology report ([id number]) revealed a follicular low-grade non-Hodgkin's lymphoma.  It was CD20  positive.  For this reason, I  was asked to see him.   Unfortunately, he is having respiratory distress.  He did have a CAT scan  done today, which did not show any evidence of a pulmonary embolism.  There  is likely pneumonia in his right lung.   He has had drenching night sweats for the past couple of months.  He has  felt fatigued.  He has had no weight loss.  He has had no obvious bleeding.  He has not noted any obvious swollen lymph nodes.   PAST MEDICAL HISTORY:  1.  Left thalamic infarct.  2.  Hypertension.  3.  Hypercholesterolemia.  4.  Paroxysmal atrial fibrillation.  5.  Low back surgery.   ALLERGIES:  None.   ADMISSION MEDICATIONS:  Aspirin 81 mg p.o. daily.   SOCIAL HISTORY:  He had no history of tobacco or alcohol use.  He is  currently retired.   FAMILY HISTORY:  Remarkable  for a brother who died of bone cancer.  A  sister died of brain sister.  Another sister has breast cancer.   REVIEW OF SYSTEMS:  As stated in the history of present illness.  A 12-  system review was undertaken.  All pertinent positives and negatives are  noted previously.   PHYSICAL EXAMINATION:  VITAL SIGNS:  Temperature 97.1, pulse 108,  respiratory rate 24, blood pressure 158/72.  GENERAL:  This is a well-developed and well-nourished white gentleman in  some respiratory distress.  HEENT:  Head exam shows no ocular or oral lesions.  NECK:  He has no palpable cervical or supraclavicular lymph nodes.  LUNGS:  Decreased breath sounds in the left lung field.  The right lung  field shows some crackles.  HEART:  Tachycardic but regular.  No murmurs, rubs, or bruits.  ABDOMEN:  Soft with good bowel sounds.  There is no palpable abdominal mass.  There is no palpable hepatosplenomegaly.  He has an abdominal binder.  His  laparotomy wound is healing.  EXTREMITIES:  No clubbing, cyanosis or edema.  He has surgical scars on his  left thigh.   His laboratory studies show a sodium of 133, potassium 4, chloride 96,  bicarb 29,  BUN 7, creatinine 0.7.  CBC:  White cell count 7.6, hemoglobin  10, hematocrit 29.4, platelet count 229.  His pro time was 15.4 seconds.  His PTT is 34 seconds.  Of note, his preop hemoglobin was 13, hematocrit 38.   IMPRESSION:  Craig Chapman is a 77 year old gentleman with what appears to be  a follicular low-grade non-Hodgkin's lymphoma.  I suspect he has had this  for quite a while.  It certainly could have been the cause of his bowel  incontinence if he had lymph nodes present on the sacral plexus.  Unfortunately, right now, he is too ill from pulmonary distress to be  considered for treatment.  I think that once the time comes, we can sort of  treat him with  Rituxan with low-dose chemotherapy, maybe with fludarabine or Cytoxan and  certainly would be appropriate for him.  I spoke with he and his family.  They are all very nice.  They all have a very strong faith.  It was  certainly good to have fellowship with them.  I told Craig Chapman that I  would follow along and then jump in at the right time.       PRE/MEDQ  D:  05/26/2005  T:  05/26/2005  Job:  [id number]   cc:   Craig Chapman, M.D.  [address]  [place]  [place] [address]   Craig Chapman, M.D. [facility]   Craig Chapman, M.D.  [address]  [place]  [place] [address]  Fax: [phone]   Craig Chapman, M.D.  [address]  [place]  [place] [address]  Fax: [phone]   Craig Chapman, M.D.  [address]  [place]  [place] [address]  Fax: [phone]

## 2011-08-17 LAB — BASIC METABOLIC PANEL
CO2: 28
Chloride: 102
GFR calc Af Amer: >60
Glucose, Bld: 135 — ABNORMAL HIGH
Potassium: 4.5
Sodium: 139

## 2011-08-17 LAB — URINALYSIS, ROUTINE W REFLEX MICROSCOPIC
Bilirubin Urine: NEGATIVE
Glucose, UA: NEGATIVE
Hgb urine dipstick: NEGATIVE
Specific Gravity, Urine: 1.015
Urobilinogen, UA: 0.2
pH: 6.5

## 2011-08-17 LAB — CBC
HCT: 39.8
Hemoglobin: 14
RBC: 4.23
RDW: 13.3

## 2011-08-17 LAB — HEMOGLOBIN AND HEMATOCRIT, BLOOD: HCT: 41.9

## 2011-11-10 ENCOUNTER — Other Ambulatory Visit (HOSPITAL_BASED_OUTPATIENT_CLINIC_OR_DEPARTMENT_OTHER): Payer: Medicare Other | Admitting: Lab

## 2011-11-10 ENCOUNTER — Telehealth: Payer: Self-pay | Admitting: *Deleted

## 2011-11-10 ENCOUNTER — Other Ambulatory Visit: Payer: Self-pay | Admitting: Hematology & Oncology

## 2011-11-10 DIAGNOSIS — C8598 Non-Hodgkin lymphoma, unspecified, lymph nodes of multiple sites: Secondary | ICD-10-CM

## 2011-11-10 DIAGNOSIS — C8589 Other specified types of non-Hodgkin lymphoma, extranodal and solid organ sites: Secondary | ICD-10-CM

## 2011-11-10 LAB — CBC WITH DIFFERENTIAL (CANCER CENTER ONLY)
BASO#: 0 10*3/uL (ref 0.0–0.2)
EOS%: 1.7 % (ref 0.0–7.0)
Eosinophils Absolute: 0.1 10*3/uL (ref 0.0–0.5)
HGB: 13.3 g/dL (ref 13.0–17.1)
LYMPH#: 1.5 10*3/uL (ref 0.9–3.3)
MCHC: 33.3 g/dL (ref 32.0–35.9)
NEUT#: 3.8 10*3/uL (ref 1.5–6.5)
Platelets: 159 10*3/uL (ref 145–400)
RBC: 4.13 10*6/uL — ABNORMAL LOW (ref 4.20–5.70)

## 2011-11-10 NOTE — Telephone Encounter (Signed)
Pt called said he was having night sweats sent to RN for triage

## 2011-11-11 LAB — COMPREHENSIVE METABOLIC PANEL
AST: 22 U/L (ref 0–37)
Albumin: 4.1 g/dL (ref 3.5–5.2)
BUN: 15 mg/dL (ref 6–23)
Calcium: 9 mg/dL (ref 8.4–10.5)
Chloride: 101 mEq/L (ref 96–112)
Potassium: 4.2 mEq/L (ref 3.5–5.3)
Sodium: 138 mEq/L (ref 135–145)
Total Protein: 7.8 g/dL (ref 6.0–8.3)

## 2011-11-11 LAB — VITAMIN D 25 HYDROXY (VIT D DEFICIENCY, FRACTURES): Vit D, 25-Hydroxy: 21 ng/mL — ABNORMAL LOW (ref 30–89)

## 2011-11-13 ENCOUNTER — Other Ambulatory Visit: Payer: Self-pay | Admitting: Hematology & Oncology

## 2011-11-13 DIAGNOSIS — C8290 Follicular lymphoma, unspecified, unspecified site: Secondary | ICD-10-CM

## 2011-11-15 ENCOUNTER — Telehealth: Payer: Self-pay | Admitting: *Deleted

## 2011-11-15 NOTE — Telephone Encounter (Signed)
Pt aware of 11-24-11 PET and to be NPO 6 hours prior. Linda aware to pre cert

## 2011-11-24 ENCOUNTER — Ambulatory Visit (HOSPITAL_BASED_OUTPATIENT_CLINIC_OR_DEPARTMENT_OTHER)
Admission: RE | Admit: 2011-11-24 | Discharge: 2011-11-24 | Disposition: A | Discharge status: Home / Self Care | Payer: Medicare Other | Source: Ambulatory Visit | Attending: Hematology & Oncology | Admitting: Hematology & Oncology

## 2011-11-24 DIAGNOSIS — C8299 Follicular lymphoma, unspecified, extranodal and solid organ sites: Secondary | ICD-10-CM

## 2011-11-24 DIAGNOSIS — C8290 Follicular lymphoma, unspecified, unspecified site: Secondary | ICD-10-CM

## 2011-11-24 DIAGNOSIS — C8589 Other specified types of non-Hodgkin lymphoma, extranodal and solid organ sites: Secondary | ICD-10-CM | POA: Insufficient documentation

## 2011-11-24 MED ORDER — FLUDEOXYGLUCOSE F - 18 (FDG) INJECTION
13.7600 | Freq: Once | INTRAVENOUS | Status: AC | PRN
  Administered 2011-11-24: 13.76 via INTRAVENOUS

## 2012-10-17 ENCOUNTER — Other Ambulatory Visit: Payer: Self-pay | Admitting: Dermatology

## 2013-10-16 ENCOUNTER — Other Ambulatory Visit: Payer: Self-pay | Admitting: Dermatology

## 2014-09-23 ENCOUNTER — Other Ambulatory Visit: Payer: Self-pay | Admitting: Dermatology

## 2015-02-15 ENCOUNTER — Emergency Department (HOSPITAL_COMMUNITY): Payer: Medicare Other

## 2015-02-15 ENCOUNTER — Emergency Department (HOSPITAL_COMMUNITY)
Admission: EM | Admit: 2015-02-15 | Discharge: 2015-02-15 | Disposition: A | Discharge status: Home / Self Care | Payer: Medicare Other | Attending: Emergency Medicine | Admitting: Emergency Medicine

## 2015-02-15 ENCOUNTER — Encounter (HOSPITAL_COMMUNITY): Payer: Self-pay | Admitting: *Deleted

## 2015-02-15 DIAGNOSIS — R2689 Other abnormalities of gait and mobility: Secondary | ICD-10-CM | POA: Insufficient documentation

## 2015-02-15 DIAGNOSIS — I10 Essential (primary) hypertension: Secondary | ICD-10-CM | POA: Diagnosis not present

## 2015-02-15 DIAGNOSIS — Z87891 Personal history of nicotine dependence: Secondary | ICD-10-CM | POA: Insufficient documentation

## 2015-02-15 DIAGNOSIS — K118 Other diseases of salivary glands: Secondary | ICD-10-CM

## 2015-02-15 DIAGNOSIS — M1990 Unspecified osteoarthritis, unspecified site: Secondary | ICD-10-CM | POA: Insufficient documentation

## 2015-02-15 DIAGNOSIS — K119 Disease of salivary gland, unspecified: Secondary | ICD-10-CM

## 2015-02-15 DIAGNOSIS — Z8572 Personal history of non-Hodgkin lymphomas: Secondary | ICD-10-CM | POA: Diagnosis not present

## 2015-02-15 DIAGNOSIS — Z7982 Long term (current) use of aspirin: Secondary | ICD-10-CM | POA: Diagnosis not present

## 2015-02-15 DIAGNOSIS — Z79899 Other long term (current) drug therapy: Secondary | ICD-10-CM | POA: Insufficient documentation

## 2015-02-15 DIAGNOSIS — Z85118 Personal history of other malignant neoplasm of bronchus and lung: Secondary | ICD-10-CM | POA: Insufficient documentation

## 2015-02-15 DIAGNOSIS — Z8673 Personal history of transient ischemic attack (TIA), and cerebral infarction without residual deficits: Secondary | ICD-10-CM | POA: Diagnosis not present

## 2015-02-15 DIAGNOSIS — R269 Unspecified abnormalities of gait and mobility: Secondary | ICD-10-CM | POA: Diagnosis present

## 2015-02-15 HISTORY — DX: Unspecified osteoarthritis, unspecified site: M19.90

## 2015-02-15 HISTORY — DX: Essential (primary) hypertension: I10

## 2015-02-15 HISTORY — DX: Personal history of other malignant neoplasm of bronchus and lung: Z85.118

## 2015-02-15 HISTORY — DX: Transient cerebral ischemic attack, unspecified: G45.9

## 2015-02-15 HISTORY — DX: Non-Hodgkin lymphoma, unspecified, unspecified site: C85.90

## 2015-02-15 HISTORY — DX: Unspecified atrial fibrillation: I48.91

## 2015-02-15 LAB — RAPID URINE DRUG SCREEN, HOSP PERFORMED
AMPHETAMINES: NOT DETECTED
Barbiturates: NOT DETECTED
Benzodiazepines: NOT DETECTED
COCAINE: NOT DETECTED
OPIATES: NOT DETECTED
TETRAHYDROCANNABINOL: NOT DETECTED

## 2015-02-15 LAB — CBC
HCT: 42.1 % (ref 39.0–52.0)
Hemoglobin: 13.7 g/dL (ref 13.0–17.0)
MCH: 31.8 pg (ref 26.0–34.0)
MCHC: 32.5 g/dL (ref 30.0–36.0)
MCV: 97.7 fL (ref 78.0–100.0)
Platelets: 178 10*3/uL (ref 150–400)
RBC: 4.31 MIL/uL (ref 4.22–5.81)
RDW: 13.7 % (ref 11.5–15.5)
WBC: 5.6 10*3/uL (ref 4.0–10.5)

## 2015-02-15 LAB — URINALYSIS, ROUTINE W REFLEX MICROSCOPIC
Bilirubin Urine: NEGATIVE
Glucose, UA: NEGATIVE mg/dL
Hgb urine dipstick: NEGATIVE
Ketones, ur: NEGATIVE mg/dL
LEUKOCYTES UA: NEGATIVE
NITRITE: NEGATIVE
PH: 6.5 (ref 5.0–8.0)
Protein, ur: NEGATIVE mg/dL
SPECIFIC GRAVITY, URINE: 1.009 (ref 1.005–1.030)
UROBILINOGEN UA: 0.2 mg/dL (ref 0.0–1.0)

## 2015-02-15 LAB — I-STAT CHEM 8, ED
BUN: 14 mg/dL (ref 6–23)
Calcium, Ion: 1.19 mmol/L (ref 1.13–1.30)
Chloride: 102 mmol/L (ref 96–112)
Creatinine, Ser: 0.8 mg/dL (ref 0.50–1.35)
Glucose, Bld: 106 mg/dL — ABNORMAL HIGH (ref 70–99)
HCT: 46 % (ref 39.0–52.0)
Hemoglobin: 15.6 g/dL (ref 13.0–17.0)
POTASSIUM: 4 mmol/L (ref 3.5–5.1)
SODIUM: 141 mmol/L (ref 135–145)
TCO2: 23 mmol/L (ref 0–100)

## 2015-02-15 LAB — DIFFERENTIAL
BASOS ABS: 0 10*3/uL (ref 0.0–0.1)
Basophils Relative: 1 % (ref 0–1)
EOS ABS: 0.2 10*3/uL (ref 0.0–0.7)
EOS PCT: 3 % (ref 0–5)
Lymphocytes Relative: 37 % (ref 12–46)
Lymphs Abs: 2 10*3/uL (ref 0.7–4.0)
Monocytes Absolute: 0.5 10*3/uL (ref 0.1–1.0)
Monocytes Relative: 10 % (ref 3–12)
NEUTROS ABS: 2.8 10*3/uL (ref 1.7–7.7)
NEUTROS PCT: 51 % (ref 43–77)

## 2015-02-15 LAB — COMPREHENSIVE METABOLIC PANEL
ALBUMIN: 4 g/dL (ref 3.5–5.2)
ALK PHOS: 46 U/L (ref 39–117)
ALT: 23 U/L (ref 0–53)
ANION GAP: 9 (ref 5–15)
AST: 32 U/L (ref 0–37)
BILIRUBIN TOTAL: 0.6 mg/dL (ref 0.3–1.2)
BUN: 14 mg/dL (ref 6–23)
CO2: 26 mmol/L (ref 19–32)
Calcium: 9.1 mg/dL (ref 8.4–10.5)
Chloride: 101 mmol/L (ref 96–112)
Creatinine, Ser: 0.87 mg/dL (ref 0.50–1.35)
GFR calc Af Amer: 89 mL/min — ABNORMAL LOW (ref >90)
GFR calc non Af Amer: 77 mL/min — ABNORMAL LOW (ref >90)
Glucose, Bld: 106 mg/dL — ABNORMAL HIGH (ref 70–99)
POTASSIUM: 3.9 mmol/L (ref 3.5–5.1)
SODIUM: 136 mmol/L (ref 135–145)
Total Protein: 7.8 g/dL (ref 6.0–8.3)

## 2015-02-15 LAB — PROTIME-INR
INR: 1.18 (ref 0.00–1.49)
PROTHROMBIN TIME: 15.1 s (ref 11.6–15.2)

## 2015-02-15 LAB — I-STAT TROPONIN, ED: Troponin i, poc: 0 ng/mL (ref 0.00–0.08)

## 2015-02-15 LAB — ETHANOL: Alcohol, Ethyl (B): <5 mg/dL (ref 0–9)

## 2015-02-15 LAB — APTT: aPTT: 32 seconds (ref 24–37)

## 2015-02-15 NOTE — ED Notes (Signed)
Patient transported to MRI 

## 2015-02-15 NOTE — ED Notes (Signed)
Pt and pt's son reports pt began having difficulty with balance and walking. Did not lose strength in any extremities. Family did not note any other symptoms. Pt also had mild occipital pain last night just after episode which resolved. Episode started at 3am after waking to go to bathroom. Went back to bed after, woke up normal this am around 73am. Similar episode same symptoms 10 years ago, was Dx as TIA, called PCP this am and was told to come to ED for eval.

## 2015-02-15 NOTE — Discharge Instructions (Signed)
Your emergency department workup is negative for stroke. You need to follow-up with your primary care provider. You need to start taking aspirin 325 daily as directed. Additionally, there was an incidental finding on your MRI consistent with a parotid gland lesion. You'll need to discuss this with your primary care provider.  Transient Ischemic Attack A transient ischemic attack (TIA) is a "warning stroke" that causes stroke-like symptoms. Unlike a stroke, a TIA does not cause permanent damage to the brain. The symptoms of a TIA can happen very fast and do not last long. It is important to know the symptoms of a TIA and what to do. This can help prevent a major stroke or death. CAUSES   A TIA is caused by a temporary blockage in an artery in the brain or neck (carotid artery). The blockage does not allow the brain to get the blood supply it needs and can cause different symptoms. The blockage can be caused by either:  A blood clot.  Fatty buildup (plaque) in a neck or brain artery. RISK FACTORS  High blood pressure (hypertension).  High cholesterol.  Diabetes mellitus.  Heart disease.  The build up of plaque in the blood vessels (peripheral artery disease or atherosclerosis).  The build up of plaque in the blood vessels providing blood and oxygen to the brain (carotid artery stenosis).  An abnormal heart rhythm (atrial fibrillation).  Obesity.  Smoking.  Taking oral contraceptives (especially in combination with smoking).  Physical inactivity.  A diet high in fats, salt (sodium), and calories.  Alcohol use.  Use of illegal drugs (especially cocaine and methamphetamine).  Being male.  Being African American.  Being over the age of 31.  Family history of stroke.  Previous history of blood clots, stroke, TIA, or heart attack.  Sickle cell disease. SYMPTOMS  TIA symptoms are the same as a stroke but are temporary. These symptoms usually develop suddenly, or may be  newly present upon awakening from sleep:  Sudden weakness or numbness of the face, arm, or leg, especially on one side of the body.  Sudden trouble walking or difficulty moving arms or legs.  Sudden confusion.  Sudden personality changes.  Trouble speaking (aphasia) or understanding.  Difficulty swallowing.  Sudden trouble seeing in one or both eyes.  Double vision.  Dizziness.  Loss of balance or coordination.  Sudden severe headache with no known cause.  Trouble reading or writing.  Loss of bowel or bladder control.  Loss of consciousness. DIAGNOSIS  Your caregiver may be able to determine the presence or absence of a TIA based on your symptoms, history, and physical exam. Computed tomography (CT scan) of the brain is usually performed to help identify a TIA. Other tests may be done to diagnose a TIA. These tests may include:  Electrocardiography.  Continuous heart monitoring.  Echocardiography.  Carotid ultrasonography.  Magnetic resonance imaging (MRI).  A scan of the brain circulation.  Blood tests. PREVENTION  The risk of a TIA can be decreased by appropriately treating high blood pressure, high cholesterol, diabetes, heart disease, and obesity and by quitting smoking, limiting alcohol, and staying physically active. TREATMENT  Time is of the essence. Since the symptoms of TIA are the same as a stroke, it is important to seek treatment as soon as possible because you may need a medicine to dissolve the clot (thrombolytic) that cannot be given if too much time has passed. Treatment options vary. Treatment options may include rest, oxygen, intravenous (IV) fluids, and medicines  to thin the blood (anticoagulants). Medicines and diet may be used to address diabetes, high blood pressure, and other risk factors. Measures will be taken to prevent short-term and long-term complications, including infection from breathing foreign material into the lungs (aspiration  pneumonia), blood clots in the legs, and falls. Treatment options include procedures to either remove plaque in the carotid arteries or dilate carotid arteries that have narrowed due to plaque. Those procedures are:  Carotid endarterectomy.  Carotid angioplasty and stenting. HOME CARE INSTRUCTIONS   Take all medicines prescribed by your caregiver. Follow the directions carefully. Medicines may be used to control risk factors for a stroke. Be sure you understand all your medicine instructions.  You may be told to take aspirin or the anticoagulant warfarin. Warfarin needs to be taken exactly as instructed.  Taking too much or too little warfarin is dangerous. Too much warfarin increases the risk of bleeding. Too little warfarin continues to allow the risk for blood clots. While taking warfarin, you will need to have regular blood tests to measure your blood clotting time. A PT blood test measures how long it takes for blood to clot. Your PT is used to calculate another value called an INR. Your PT and INR help your caregiver to adjust your dose of warfarin. The dose can change for many reasons. It is critically important that you take warfarin exactly as prescribed.  Many foods, especially foods high in vitamin K can interfere with warfarin and affect the PT and INR. Foods high in vitamin K include spinach, kale, broccoli, cabbage, collard and turnip greens, brussels sprouts, peas, cauliflower, seaweed, and parsley as well as beef and pork liver, green tea, and soybean oil. You should eat a consistent amount of foods high in vitamin K. Avoid major changes in your diet, or notify your caregiver before changing your diet. Arrange a visit with a dietitian to answer your questions.  Many medicines can interfere with warfarin and affect the PT and INR. You must tell your caregiver about any and all medicines you take, this includes all vitamins and supplements. Be especially cautious with aspirin and  anti-inflammatory medicines. Do not take or discontinue any prescribed or over-the-counter medicine except on the advice of your caregiver or pharmacist.  Warfarin can have side effects, such as excessive bruising or bleeding. You will need to hold pressure over cuts for longer than usual. Your caregiver or pharmacist will discuss other potential side effects.  Avoid sports or activities that may cause injury or bleeding.  Be mindful when shaving, flossing your teeth, or handling sharp objects.  Alcohol can change the body's ability to handle warfarin. It is best to avoid alcoholic drinks or consume only very small amounts while taking warfarin. Notify your caregiver if you change your alcohol intake.  Notify your dentist or other caregivers before procedures.  Eat a diet that includes 5 or more servings of fruits and vegetables each day. This may reduce the risk of stroke. Certain diets may be prescribed to address high blood pressure, high cholesterol, diabetes, or obesity.  A low-sodium, low-saturated fat, low-trans fat, low-cholesterol diet is recommended to manage high blood pressure.  A low-saturated fat, low-trans fat, low-cholesterol, and high-fiber diet may control cholesterol levels.  A controlled-carbohydrate, controlled-sugar diet is recommended to manage diabetes.  A reduced-calorie, low-sodium, low-saturated fat, low-trans fat, low-cholesterol diet is recommended to manage obesity.  Maintain a healthy weight.  Stay physically active. It is recommended that you get at least 30 minutes of  activity on most or all days.  Do not smoke.  Limit alcohol use even if you are not taking warfarin. Moderate alcohol use is considered to be:  No more than 2 drinks each day for men.  No more than 1 drink each day for nonpregnant women.  Stop drug abuse.  Home safety. A safe home environment is important to reduce the risk of falls. Your caregiver may arrange for specialists to  evaluate your home. Having grab bars in the bedroom and bathroom is often important. Your caregiver may arrange for equipment to be used at home, such as raised toilets and a seat for the shower.  Follow all instructions for follow-up with your caregiver. This is very important. This includes any referrals and lab tests. Proper follow up can prevent a stroke or another TIA from occurring. SEEK MEDICAL CARE IF:  You have personality changes.  You have difficulty swallowing.  You are seeing double.  You have dizziness.  You have a fever.  You have skin breakdown. SEEK IMMEDIATE MEDICAL CARE IF:  Any of these symptoms may represent a serious problem that is an emergency. Do not wait to see if the symptoms will go away. Get medical help right away. Call your local emergency services (911 in [place] Do not drive yourself to the hospital.  You have sudden weakness or numbness of the face, arm, or leg, especially on one side of the body.  You have sudden trouble walking or difficulty moving arms or legs.  You have sudden confusion.  You have trouble speaking (aphasia) or understanding.  You have sudden trouble seeing in one or both eyes.  You have a loss of balance or coordination.  You have a sudden, severe headache with no known cause.  You have new chest pain or an irregular heartbeat.  You have a partial or total loss of consciousness. MAKE SURE YOU:   Understand these instructions.  Will watch your condition.  Will get help right away if you are not doing well or get worse. Document Released: 08/04/2005 Document Revised: 10/30/2013 Document Reviewed: 01/30/2014 [facility] Patient Information 2015 [place], [place]. This information is not intended to replace advice given to you by your health care provider. Make sure you discuss any questions you have with your health care provider.

## 2015-02-15 NOTE — ED Provider Notes (Signed)
CSN: [id number]     Arrival date & time 02/15/15  1351 History   First MD Initiated Contact with Patient 02/15/15 1500     Chief Complaint  Patient presents with  . Possible TIA last night      (Consider location/radiation/quality/duration/timing/severity/associated sxs/prior Treatment) HPI Comments: Patient presents to the ED with a chief complaint of difficulty balancing.  Patient states that last night he found that he was unable to walk/balance.  Patient states that he was veering off course while trying to walk.  He states that he has had symptoms similar to this in the past and was diagnosed with TIA.  He has a hx of afib and is supposed to take 325 mg of apirin, but states that he has self tapered down to 81 mg every couple of days.  He states that when he awoke this morning he was symptom free.  He denies any slurred speech, facial droop, weakness or numbness of the extremities, visual field deficits.  States that last night he had a posterior headache when the symptoms occurred.  He is currently pain and symptom free.  The history is provided by the patient. No language interpreter was used.    Past Medical History  Diagnosis Date  . TIA (transient ischemic attack)   . Hypertension   . History of lung or bronchial cancer   . Lymphoma   . Arthritis   . Atrial fibrillation    Past Surgical History  Procedure Laterality Date  . Pneumonectomy    . Cholecystectomy    . Replacement total knee bilateral    . Hernia repair    . Rotator cuff repair    . Back surgery     No family history on file. History  Substance Use Topics  . Smoking status: Former Research scientist (life sciences)  . Smokeless tobacco: Not on file  . Alcohol Use: No    Review of Systems  Constitutional: Negative for fever and chills.  Respiratory: Negative for shortness of breath.   Cardiovascular: Negative for chest pain.  Gastrointestinal: Negative for nausea, vomiting, diarrhea and constipation.  Genitourinary: Negative for  dysuria.  Neurological:       Difficulty balancing  All other systems reviewed and are negative.     Allergies  Sulfa antibiotics  Home Medications   Prior to Admission medications   Medication Sig Start Date End Date Taking? Authorizing Provider  chlorthalidone (HYGROTON) 25 MG tablet Take 25 mg by mouth daily.  02/06/15  Yes Historical Provider, MD  aspirin EC 81 MG tablet Take 162 mg by mouth daily.    Historical Provider, MD   BP 151/75 mmHg  Pulse 68  Temp(Src) 98.5 F (36.9 C)  Resp 22  SpO2 98% Physical Exam  Constitutional: He is oriented to person, place, and time. He appears well-developed and well-nourished.  HENT:  Head: Normocephalic and atraumatic.  Eyes: Conjunctivae and EOM are normal. Pupils are equal, round, and reactive to light. Right eye exhibits no discharge. Left eye exhibits no discharge. No scleral icterus.  Neck: Normal range of motion. Neck supple. No JVD present.  Cardiovascular: Normal rate, regular rhythm and normal heart sounds.  Exam reveals no gallop and no friction rub.   No murmur heard. Pulmonary/Chest: Effort normal and breath sounds normal. No respiratory distress. He has no wheezes. He has no rales. He exhibits no tenderness.  Abdominal: Soft. He exhibits no distension and no mass. There is no tenderness. There is no rebound and no guarding.  Musculoskeletal:  Normal range of motion. He exhibits no edema or tenderness.  5/5 ROM and strength of extremities  Neurological: He is alert and oriented to person, place, and time.  CN 3-12 intact, normal peripheral vision, normal finger to nose, normal heel to shin, no pronator drift, able to ambulate without difficulty, no strength or sensation deficits, speech is clear  Skin: Skin is warm and dry.  Psychiatric: He has a normal mood and affect. His behavior is normal. Judgment and thought content normal.  Nursing note and vitals reviewed.   ED Course  Procedures (including critical care  time) Results for orders placed or performed during the hospital encounter of 02/15/15  Ethanol  Result Value Ref Range   Alcohol, Ethyl (B) <5 0 - 9 mg/dL  Protime-INR  Result Value Ref Range   Prothrombin Time 15.1 11.6 - 15.2 seconds   INR 1.18 0.00 - 1.49  APTT  Result Value Ref Range   aPTT 32 24 - 37 seconds  CBC  Result Value Ref Range   WBC 5.6 4.0 - 10.5 K/uL   RBC 4.31 4.22 - 5.81 MIL/uL   Hemoglobin 13.7 13.0 - 17.0 g/dL   HCT 42.1 39.0 - 52.0 %   MCV 97.7 78.0 - 100.0 fL   MCH 31.8 26.0 - 34.0 pg   MCHC 32.5 30.0 - 36.0 g/dL   RDW 13.7 11.5 - 15.5 %   Platelets 178 150 - 400 K/uL  Differential  Result Value Ref Range   Neutrophils Relative % 51 43 - 77 %   Neutro Abs 2.8 1.7 - 7.7 K/uL   Lymphocytes Relative 37 12 - 46 %   Lymphs Abs 2.0 0.7 - 4.0 K/uL   Monocytes Relative 10 3 - 12 %   Monocytes Absolute 0.5 0.1 - 1.0 K/uL   Eosinophils Relative 3 0 - 5 %   Eosinophils Absolute 0.2 0.0 - 0.7 K/uL   Basophils Relative 1 0 - 1 %   Basophils Absolute 0.0 0.0 - 0.1 K/uL  Comprehensive metabolic panel  Result Value Ref Range   Sodium 136 135 - 145 mmol/L   Potassium 3.9 3.5 - 5.1 mmol/L   Chloride 101 96 - 112 mmol/L   CO2 26 19 - 32 mmol/L   Glucose, Bld 106 (H) 70 - 99 mg/dL   BUN 14 6 - 23 mg/dL   Creatinine, Ser 0.87 0.50 - 1.35 mg/dL   Calcium 9.1 8.4 - 10.5 mg/dL   Total Protein 7.8 6.0 - 8.3 g/dL   Albumin 4.0 3.5 - 5.2 g/dL   AST 32 0 - 37 U/L   ALT 23 0 - 53 U/L   Alkaline Phosphatase 46 39 - 117 U/L   Total Bilirubin 0.6 0.3 - 1.2 mg/dL   GFR calc non Af Amer 77 (L) >90 mL/min   GFR calc Af Amer 89 (L) >90 mL/min   Anion gap 9 5 - 15  Urine Drug Screen  Result Value Ref Range   Opiates NONE DETECTED NONE DETECTED   Cocaine NONE DETECTED NONE DETECTED   Benzodiazepines NONE DETECTED NONE DETECTED   Amphetamines NONE DETECTED NONE DETECTED   Tetrahydrocannabinol NONE DETECTED NONE DETECTED   Barbiturates NONE DETECTED NONE DETECTED   Urinalysis, Routine w reflex microscopic  Result Value Ref Range   Color, Urine YELLOW YELLOW   APPearance CLEAR CLEAR   Specific Gravity, Urine 1.009 1.005 - 1.030   pH 6.5 5.0 - 8.0   Glucose, UA NEGATIVE NEGATIVE mg/dL  Hgb urine dipstick NEGATIVE NEGATIVE   Bilirubin Urine NEGATIVE NEGATIVE   Ketones, ur NEGATIVE NEGATIVE mg/dL   Protein, ur NEGATIVE NEGATIVE mg/dL   Urobilinogen, UA 0.2 0.0 - 1.0 mg/dL   Nitrite NEGATIVE NEGATIVE   Leukocytes, UA NEGATIVE NEGATIVE  I-Stat Chem 8, ED  Result Value Ref Range   Sodium 141 135 - 145 mmol/L   Potassium 4.0 3.5 - 5.1 mmol/L   Chloride 102 96 - 112 mmol/L   BUN 14 6 - 23 mg/dL   Creatinine, Ser 0.80 0.50 - 1.35 mg/dL   Glucose, Bld 106 (H) 70 - 99 mg/dL   Calcium, Ion 1.19 1.13 - 1.30 mmol/L   TCO2 23 0 - 100 mmol/L   Hemoglobin 15.6 13.0 - 17.0 g/dL   HCT 46.0 39.0 - 52.0 %  I-Stat Troponin, ED (not at [facility])  Result Value Ref Range   Troponin i, poc 0.00 0.00 - 0.08 ng/mL   Comment 3           Mr Brain Wo Contrast  02/15/2015   CLINICAL DATA:  81 year old hypertensive male with unsteady gait, dizziness and occipital pain. History of non-Hodgkin's lymphoma diagnosed 2006, lung cancer and atrial fibrillation. Initial encounter.  EXAM: MRI HEAD WITHOUT CONTRAST  TECHNIQUE: Multiplanar, multiecho pulse sequences of the brain and surrounding structures were obtained without intravenous contrast.  COMPARISON:  03/15/2011.  FINDINGS: No acute infarct.  No intracranial hemorrhage.  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 Moderate small vessel disease type changes.  Global atrophy without hydrocephalus.  No intracranial mass lesion noted on this unenhanced exam.  Major intracranial vascular structures are patent.  Post lens replacement otherwise orbital structures unremarkable.  Cervical medullary junction, pituitary region and pineal region unremarkable.  Left parotid 9 mm nonspecific lesion. Although this may  represent a small lymph node, primary parotid lesion cannot be excluded.  Minimal paranasal sinus mucosal thickening.  IMPRESSION: No acute infarct.  No intracranial hemorrhage.  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 Moderate small vessel disease type changes.  Global atrophy without hydrocephalus.  No intracranial mass lesion noted on this unenhanced exam.  Left parotid 9 mm nonspecific lesion. Although this may represent a small lymph node, primary parotid lesion cannot be excluded.   Electronically Signed   By: Genia Del M.D.   On: 02/15/2015 17:31      EKG Interpretation None      MDM   Final diagnoses:  Balance problem  Lesion of parotid gland    Patient with hx of TIAs and afib, non-compliant with aspirin presents with episode of ataxia last last night.  Symptoms were fleeting.  Asymptomatic now.  Will check labs, EKG, and anticipate MRI.  Patient seen by and discussed with Dr. Ashok Cordia, who agrees with plan to check MRI.  Dispo per MRI.  6:18 PM Patient remains asymptomatic. Vital signs are stable. MRI is negative for acute process. Incidental finding of parotid gland lesion discussed with patient. He will follow-up with primary care provider. Patient stable and ready for discharge.  Dr. Ashok Cordia agrees with plan.    Montine Circle, PA-C 02/15/15 1819  Lajean Saver, MD 02/15/15 2056

## 2017-05-14 ENCOUNTER — Inpatient Hospital Stay (HOSPITAL_COMMUNITY): Payer: Medicare Other

## 2017-05-14 ENCOUNTER — Encounter (HOSPITAL_COMMUNITY): Payer: Self-pay | Admitting: *Deleted

## 2017-05-14 ENCOUNTER — Emergency Department (HOSPITAL_COMMUNITY): Payer: Medicare Other

## 2017-05-14 ENCOUNTER — Inpatient Hospital Stay (HOSPITAL_COMMUNITY)
Admission: EM | Admit: 2017-05-14 | Discharge: 2017-05-17 | DRG: 482 | Disposition: A | Discharge status: Skilled Nursing Facility | Payer: Medicare Other | Attending: Internal Medicine | Admitting: Internal Medicine

## 2017-05-14 DIAGNOSIS — Z882 Allergy status to sulfonamides status: Secondary | ICD-10-CM | POA: Diagnosis not present

## 2017-05-14 DIAGNOSIS — I480 Paroxysmal atrial fibrillation: Secondary | ICD-10-CM | POA: Diagnosis present

## 2017-05-14 DIAGNOSIS — S7291XA Unspecified fracture of right femur, initial encounter for closed fracture: Secondary | ICD-10-CM

## 2017-05-14 DIAGNOSIS — Z8673 Personal history of transient ischemic attack (TIA), and cerebral infarction without residual deficits: Secondary | ICD-10-CM | POA: Diagnosis not present

## 2017-05-14 DIAGNOSIS — M810 Age-related osteoporosis without current pathological fracture: Secondary | ICD-10-CM | POA: Diagnosis present

## 2017-05-14 DIAGNOSIS — S72451A Displaced supracondylar fracture without intracondylar extension of lower end of right femur, initial encounter for closed fracture: Principal | ICD-10-CM

## 2017-05-14 DIAGNOSIS — S72491A Other fracture of lower end of right femur, initial encounter for closed fracture: Secondary | ICD-10-CM

## 2017-05-14 DIAGNOSIS — W19XXXA Unspecified fall, initial encounter: Secondary | ICD-10-CM

## 2017-05-14 DIAGNOSIS — Z85118 Personal history of other malignant neoplasm of bronchus and lung: Secondary | ICD-10-CM

## 2017-05-14 DIAGNOSIS — I10 Essential (primary) hypertension: Secondary | ICD-10-CM

## 2017-05-14 DIAGNOSIS — Z96653 Presence of artificial knee joint, bilateral: Secondary | ICD-10-CM | POA: Diagnosis present

## 2017-05-14 DIAGNOSIS — W19XXXD Unspecified fall, subsequent encounter: Secondary | ICD-10-CM | POA: Diagnosis not present

## 2017-05-14 DIAGNOSIS — Y92008 Other place in unspecified non-institutional (private) residence as the place of occurrence of the external cause: Secondary | ICD-10-CM | POA: Diagnosis not present

## 2017-05-14 DIAGNOSIS — W108XXA Fall (on) (from) other stairs and steps, initial encounter: Secondary | ICD-10-CM | POA: Diagnosis present

## 2017-05-14 DIAGNOSIS — M25561 Pain in right knee: Secondary | ICD-10-CM | POA: Diagnosis present

## 2017-05-14 LAB — BASIC METABOLIC PANEL
ANION GAP: 9 (ref 5–15)
BUN: 11 mg/dL (ref 6–20)
CO2: 26 mmol/L (ref 22–32)
Calcium: 8.7 mg/dL — ABNORMAL LOW (ref 8.9–10.3)
Chloride: 103 mmol/L (ref 101–111)
Creatinine, Ser: 0.84 mg/dL (ref 0.61–1.24)
GFR calc non Af Amer: >60 mL/min (ref >60)
GLUCOSE: 139 mg/dL — AB (ref 65–99)
POTASSIUM: 3.5 mmol/L (ref 3.5–5.1)
Sodium: 138 mmol/L (ref 135–145)

## 2017-05-14 LAB — CBC WITH DIFFERENTIAL/PLATELET
BASOS ABS: 0 10*3/uL (ref 0.0–0.1)
Basophils Relative: 0 %
Eosinophils Absolute: 0 10*3/uL (ref 0.0–0.7)
Eosinophils Relative: 0 %
HEMATOCRIT: 36.8 % — AB (ref 39.0–52.0)
Hemoglobin: 12.3 g/dL — ABNORMAL LOW (ref 13.0–17.0)
Lymphocytes Relative: 13 %
Lymphs Abs: 1.2 10*3/uL (ref 0.7–4.0)
MCH: 31.8 pg (ref 26.0–34.0)
MCHC: 33.4 g/dL (ref 30.0–36.0)
MCV: 95.1 fL (ref 78.0–100.0)
MONO ABS: 0.6 10*3/uL (ref 0.1–1.0)
Monocytes Relative: 6 %
NEUTROS PCT: 81 %
Neutro Abs: 7.5 10*3/uL (ref 1.7–7.7)
Platelets: 153 10*3/uL (ref 150–400)
RBC: 3.87 MIL/uL — ABNORMAL LOW (ref 4.22–5.81)
RDW: 13.5 % (ref 11.5–15.5)
WBC: 9.3 10*3/uL (ref 4.0–10.5)

## 2017-05-14 LAB — SURGICAL PCR SCREEN
MRSA, PCR: NEGATIVE
STAPHYLOCOCCUS AUREUS: POSITIVE — AB

## 2017-05-14 LAB — PROTIME-INR
INR: 1.21
Prothrombin Time: 15.3 seconds — ABNORMAL HIGH (ref 11.4–15.2)

## 2017-05-14 MED ORDER — CHLORHEXIDINE GLUCONATE 4 % EX LIQD
60.0000 mL | Freq: Once | CUTANEOUS | Status: AC
  Administered 2017-05-15: 4 via TOPICAL
  Filled 2017-05-14: qty 60

## 2017-05-14 MED ORDER — ENOXAPARIN SODIUM 40 MG/0.4ML ~~LOC~~ SOLN: 40.0000 mg | Freq: Once | SUBCUTANEOUS | Status: DC

## 2017-05-14 MED ORDER — POVIDONE-IODINE 10 % EX SWAB: 2.0000 "application " | Freq: Once | CUTANEOUS | Status: DC

## 2017-05-14 MED ORDER — HYDROCODONE-ACETAMINOPHEN 5-325 MG PO TABS
1.0000 | ORAL_TABLET | Freq: Four times a day (QID) | ORAL | Status: DC | PRN
  Administered 2017-05-14 – 2017-05-15 (×2): 2 via ORAL
  Filled 2017-05-14 (×2): qty 2

## 2017-05-14 MED ORDER — ENOXAPARIN SODIUM 40 MG/0.4ML ~~LOC~~ SOLN
40.0000 mg | Freq: Once | SUBCUTANEOUS | Status: AC
  Administered 2017-05-14: 40 mg via SUBCUTANEOUS
  Filled 2017-05-14: qty 0.4

## 2017-05-14 MED ORDER — LIDOCAINE HCL (PF) 1 % IJ SOLN
20.0000 mL | Freq: Once | INTRAMUSCULAR | Status: AC
  Administered 2017-05-14: 20 mL via INTRADERMAL
  Filled 2017-05-14: qty 20

## 2017-05-14 MED ORDER — CEFAZOLIN SODIUM-DEXTROSE 2-4 GM/100ML-% IV SOLN
2.0000 g | INTRAVENOUS | Status: AC
  Administered 2017-05-15: 2 g via INTRAVENOUS

## 2017-05-14 MED ORDER — HYDROMORPHONE HCL 1 MG/ML IJ SOLN
1.0000 mg | Freq: Once | INTRAMUSCULAR | Status: AC
  Administered 2017-05-14: 1 mg via INTRAVENOUS
  Filled 2017-05-14: qty 1

## 2017-05-14 MED ORDER — MORPHINE SULFATE (PF) 4 MG/ML IV SOLN: 1.0000 mg | INTRAVENOUS | Status: DC | PRN

## 2017-05-14 MED ORDER — MORPHINE SULFATE (PF) 4 MG/ML IV SOLN
4.0000 mg | Freq: Once | INTRAVENOUS | Status: AC
  Administered 2017-05-14: 4 mg via INTRAVENOUS
  Filled 2017-05-14: qty 1

## 2017-05-14 NOTE — ED Triage Notes (Signed)
Pt requested to get pain meds injected into knee. Pt  Informed  That joint injection is not done and Pain meds will be placed in IV access.

## 2017-05-14 NOTE — Consult Note (Signed)
ORTHOPAEDIC CONSULTATION  REQUESTING PHYSICIAN: Cardama, Craig Chapman, *  Chief Complaint: Right knee pain  HPI: Craig Chapman is a 83 y.o. male who presents with a supracondylar right femur fracture. Patient states that he was walking down the stairs missed the last step and fell sustaining the supracondylar femur fracture. He denies any loss of consciousness. Patient reports a past medical history significant for pneumonectomy on the left with approximately 98% pulmonary function and status post internal fixation for lumbar spine fracture. Patient is status post bilateral total knee arthroplasties performed in [place].  Past Medical History:  Diagnosis Date  . Arthritis   . Atrial fibrillation ([place])   . History of lung or bronchial cancer   . Hypertension   . Lymphoma ([place])   . TIA (transient ischemic attack)    Past Surgical History:  Procedure Laterality Date  . BACK SURGERY    . CHOLECYSTECTOMY    . HERNIA REPAIR    . PNEUMONECTOMY    . REPLACEMENT TOTAL KNEE BILATERAL    . ROTATOR CUFF REPAIR     Social History   Social History  . Marital status: Married    Spouse name: N/A  . Number of children: N/A  . Years of education: N/A   Social History Main Topics  . Smoking status: Former Research scientist (life sciences)  . Smokeless tobacco: Never Used  . Alcohol use No  . Drug use: No  . Sexual activity: Not Asked   Other Topics Concern  . None   Social History Narrative  . None   History reviewed. No pertinent family history. - negative except otherwise stated in the family history section Allergies  Allergen Reactions  . Sulfa Antibiotics Hives   Prior to Admission medications   Medication Sig Start Date End Date Taking? Authorizing Provider  ibuprofen (ADVIL,MOTRIN) 200 MG tablet Take 200 mg by mouth every 6 (six) hours as needed for headache or moderate pain.   Yes [provider]   Dg Chest 1 View  Result Date: 05/14/2017 CLINICAL DATA:  Leg pain after  fall. EXAM: CHEST 1 VIEW COMPARISON:  June 06, 2009 FINDINGS: The patient is status post left pneumonectomy with complete opacification of the left hemithorax. No focal infiltrate identified. No acute interval changes. IMPRESSION: No acute abnormality. Electronically Signed   By: Dorise Bullion III M.D   On: 05/14/2017 12:34   Dg Knee Complete 4 Views Right  Result Date: 05/14/2017 CLINICAL DATA:  Pain after fall. EXAM: RIGHT KNEE - COMPLETE 4+ VIEW COMPARISON:  None. FINDINGS: The patient is status post knee replacement. The proximal fibula and tibia are intact. There is a comminuted displaced fracture of the distal femur. The fracture line extends nearly to the level of the knee replacement hardware in the distal femur. 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 IMPRESSION: Comminuted displaced fracture of the distal femur extending nearly to the level of the knee replacement hardware. Lipoma hemarthrosis. Electronically Signed   By: Dorise Bullion III M.D   On: 05/14/2017 12:31   Dg Femur, Min 2 Views Right  Result Date: 05/14/2017 CLINICAL DATA:  Pain after fall EXAM: RIGHT FEMUR 2 VIEWS COMPARISON:  None. FINDINGS: Comminuted displaced fracture of the distal femur as described on the knee x-ray. IMPRESSION: Comminuted displaced fracture of the distal femur as described on the knee x-ray. The proximal femur is intact. Electronically Signed   By: Dorise Bullion III M.D   On: 05/14/2017 12:32   -  pertinent xrays, CT, MRI studies were reviewed and independently interpreted  Positive ROS: All other systems have been reviewed and were otherwise negative with the exception of those mentioned in the HPI and as above.  Physical Exam: General: Alert, no acute distress Psychiatric: Patient is competent for consent with normal mood and affect Lymphatic: No axillary or cervical lymphadenopathy Cardiovascular: No pedal edema Respiratory: No cyanosis, no use of accessory  musculature GI: No organomegaly, abdomen is soft and non-tender  Skin: Patient's skin is intact no lacerations no bruising no abrasions. There is no ecchymosis.   Neurologic: Patient does have protective sensation bilateral lower extremities.   MUSCULOSKELETAL:  Examination patient has a good dorsalis pedis and posterior tibial pulse. Radiographs shows a displaced flexed supracondylar femur fracture on the right. The total knee arthroplasty is intact.  Assessment: Assessment: Closed right supracondylar femur fracture with total knee arthroplasty.  Plan: Plan: We'll plan for open reduction internal fixation of the right supracondylar fracture with a lateral plate. Patient has a very large femoral  and I doubt that intramedullary nailing that would fit through the femoral component would be large enough to provide stable fixation. Plan on a lateral incision with a supracondylar plate. Risks and benefits were discussed including infection neurovascular injury failure internal fixation. Patient states he understands wishes to proceed at this time.  Thank you for the consult and the opportunity to see Craig Chapman, [place] [phone] 2:33 PM

## 2017-05-14 NOTE — ED Triage Notes (Signed)
PT refuses knee immobilizer because of knee pain.

## 2017-05-14 NOTE — ED Triage Notes (Signed)
EDP in Pt room to apply  Knee immobilizer.

## 2017-05-14 NOTE — ED Provider Notes (Signed)
[facility] DEPT Provider Note   CSN: [id number] Arrival date & time: 05/14/17  1056     History   Chief Complaint Chief Complaint  Patient presents with  . Knee Injury    HPI Craig Chapman is a 83 y.o. male.  HPI  83 year old male with a history of hypertension, A. fib, arthritis status post bilateral knee replacement who presents to the ED with right knee pain following mechanical fall just prior to arrival. Patient reports that he was walking down steps in his garage and missed a step causing him to fall with his right leg underneath him. Pain is exacerbated with palpation of the right knee and movements. States that he is unable to straighten his leg. No other alleviating or aggravating factors. Patient denied any head trauma, loss of consciousness, amnesia to the event. He is denying any headache, neck pain, back pain, chest pain, abdominal pain, hip pain. Denies any other physical complaints.  Patient denies being on any anticoagulation.  Past Medical History:  Diagnosis Date  . Arthritis   . Atrial fibrillation ([place])   . History of lung or bronchial cancer   . Hypertension   . Lymphoma ([place])   . TIA (transient ischemic attack)     Patient Active Problem List   Diagnosis Date Noted  . Closed comminuted supracondylar fracture of femur, right, initial encounter ([place])   . Fall     Past Surgical History:  Procedure Laterality Date  . BACK SURGERY    . CHOLECYSTECTOMY    . HERNIA REPAIR    . PNEUMONECTOMY    . REPLACEMENT TOTAL KNEE BILATERAL    . ROTATOR CUFF REPAIR         Home Medications    Prior to Admission medications   Medication Sig Start Date End Date Taking? Authorizing Provider  ibuprofen (ADVIL,MOTRIN) 200 MG tablet Take 200 mg by mouth every 6 (six) hours as needed for headache or moderate pain.   Yes [provider]    Family History History reviewed. No pertinent family history.  Social History Social History  Substance  Use Topics  . Smoking status: Former Research scientist (life sciences)  . Smokeless tobacco: Never Used  . Alcohol use No     Allergies   Sulfa antibiotics   Review of Systems Review of Systems All other systems are reviewed and are negative for acute change except as noted in the HPI   Physical Exam Updated Vital Signs BP (!) 197/94 (BP Location: Right Arm)   Pulse 83   Temp 97.8 F (36.6 C) (Oral)   Resp 18   SpO2 100%   Physical Exam  Constitutional: He is oriented to person, place, and time. He appears well-developed and well-nourished. No distress.  HENT:  Head: Normocephalic.  Right Ear: External ear normal.  Left Ear: External ear normal.  Mouth/Throat: Oropharynx is clear and moist.  Eyes: Conjunctivae and EOM are normal. Pupils are equal, round, and reactive to light. Right eye exhibits no discharge. Left eye exhibits no discharge. No scleral icterus.  Neck: Normal range of motion. Neck supple.  Cardiovascular: Regular rhythm and normal heart sounds.  Exam reveals no gallop and no friction rub.   No murmur heard. Pulses:      Radial pulses are 2+ on the right side, and 2+ on the left side.       Dorsalis pedis pulses are 2+ on the right side, and 2+ on the left side.  Pulmonary/Chest: Effort normal and breath sounds normal.  No stridor. No respiratory distress.  Abdominal: Soft. He exhibits no distension. There is no tenderness.  Musculoskeletal:       Right knee: He exhibits decreased range of motion, swelling, effusion and bony tenderness. Tenderness found. Medial joint line and lateral joint line tenderness noted.       Right ankle: No tenderness.       Cervical back: He exhibits no bony tenderness.       Thoracic back: He exhibits no bony tenderness.       Lumbar back: He exhibits no bony tenderness.       Right upper leg: He exhibits no bony tenderness.       Right lower leg: He exhibits no bony tenderness.       Right foot: There is no bony tenderness.  Clavicle stable. Chest  stable to AP/Lat compression. Pelvis stable to Lat compression. No obvious extremity deformity. No chest or abdominal wall contusion.  Neurological: He is alert and oriented to person, place, and time. GCS eye subscore is 4. GCS verbal subscore is 5. GCS motor subscore is 6.  Moving all extremities   Skin: Skin is warm. He is not diaphoretic.     ED Treatments / Results  Labs (all labs ordered are listed, but only abnormal results are displayed) Labs Reviewed  CBC WITH DIFFERENTIAL/PLATELET - Abnormal; Notable for the following:       Result Value   RBC 3.87 (*)    Hemoglobin 12.3 (*)    HCT 36.8 (*)    All other components within normal limits  BASIC METABOLIC PANEL - Abnormal; Notable for the following:    Glucose, Bld 139 (*)    Calcium 8.7 (*)    All other components within normal limits  PROTIME-INR - Abnormal; Notable for the following:    Prothrombin Time 15.3 (*)    All other components within normal limits    EKG  EKG Interpretation None       Radiology Dg Chest 1 View  Result Date: 05/14/2017 CLINICAL DATA:  Leg pain after fall. EXAM: CHEST 1 VIEW COMPARISON:  June 06, 2009 FINDINGS: The patient is status post left pneumonectomy with complete opacification of the left hemithorax. No focal infiltrate identified. No acute interval changes. IMPRESSION: No acute abnormality. Electronically Signed   By: Dorise Bullion III M.D   On: 05/14/2017 12:34   Dg Knee Complete 4 Views Right  Result Date: 05/14/2017 CLINICAL DATA:  Pain after fall. EXAM: RIGHT KNEE - COMPLETE 4+ VIEW COMPARISON:  None. FINDINGS: The patient is status post knee replacement. The proximal fibula and tibia are intact. There is a comminuted displaced fracture of the distal femur. The fracture line extends nearly to the level of the knee replacement hardware in the distal femur. 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 IMPRESSION: Comminuted displaced fracture of the distal  femur extending nearly to the level of the knee replacement hardware. Lipoma hemarthrosis. Electronically Signed   By: Dorise Bullion III M.D   On: 05/14/2017 12:31   Dg Femur, Min 2 Views Right  Result Date: 05/14/2017 CLINICAL DATA:  Pain after fall EXAM: RIGHT FEMUR 2 VIEWS COMPARISON:  None. FINDINGS: Comminuted displaced fracture of the distal femur as described on the knee x-ray. IMPRESSION: Comminuted displaced fracture of the distal femur as described on the knee x-ray. The proximal femur is intact. Electronically Signed   By: Dorise Bullion III M.D   On: 05/14/2017 12:32  Procedures ORTHOPEDIC INJURY TREATMENT Date/Time: 05/14/2017 3:31 PM Performed by: Fatima Blank Authorized by: Fatima Blank  Consent: Verbal consent obtained. Consent given by: patient Patient understanding: patient states understanding of the procedure being performed Patient identity confirmed: verbally with patient Time out: Immediately prior to procedure a "time out" was called to verify the correct patient, procedure, equipment, support staff and site/side marked as required. Injury location: lower leg Location details: right lower leg Injury type: fracture-dislocation Pre-procedure neurovascular assessment: neurovascularly intact Anesthesia: hematoma block  Anesthesia: Local anesthesia used: yes Anesthetic total: 15 mL  Sedation: Patient sedated: no Manipulation performed: yes Skeletal traction used: yes Reduction successful: no (but improved alignment) X-ray confirmed reduction: yes Immobilization: knee immobilizer. Post-procedure neurovascular assessment: post-procedure neurovascularly intact Patient tolerance: Patient tolerated the procedure well with no immediate complications    (including critical care time)  Medications Ordered in ED Medications  morphine 4 MG/ML injection 4 mg (4 mg Intravenous Given 05/14/17 1130)  HYDROmorphone (DILAUDID) injection 1 mg (1 mg  Intravenous Given 05/14/17 1241)  lidocaine (PF) (XYLOCAINE) 1 % injection 20 mL (20 mLs Intradermal Given 05/14/17 1311)  HYDROmorphone (DILAUDID) injection 1 mg (1 mg Intravenous Given 05/14/17 1426)     Initial Impression / Assessment and Plan / ED Course  I have reviewed the triage vital signs and the nursing notes.  Pertinent labs & imaging results that were available during my care of the patient were reviewed by me and considered in my medical decision making (see chart for details).     Workup consistent with displaced distal femur fracture. Patient is neurovascularly intact distally. No other injuries noted on exam or on imaging. Patient denied any head trauma or loss of consciousness. He is not currently anticoagulated. Not complaining of any headache. Do not feel a CT head is warranted at this time.  Screening labs obtained and grossly reassuring.  Patient provided with several doses of IV pain medicine as well as a hematoma block. Fracture reduction attempted. Placed in a knee immobilizer. Pain improved after knee immobilizer application. Case discussed with orthopedic surgery, Dr. Sharol Given, who will plan for surgery in the morning. Given patient's extensive medical history, will admit the patient to medicine for management of his automatic medical needs.  Final Clinical Impressions(s) / ED Diagnoses   Final diagnoses:  Fall  Other closed fracture of distal end of right femur, initial encounter ([place])      Prateek Knipple, Grayce Sessions, MD 05/14/17 1731

## 2017-05-14 NOTE — H&P (Signed)
History and Physical    Craig Chapman:[id number] DOB: [birth year] DOA: 05/14/2017   PCP: Patient, No Pcp Per   Patient coming from/Resides with: Private residence/wife  Chief Complaint: Right leg pain after mechanical fall  HPI: Craig Chapman is a 83 y.o. male with medical history significant for paroxysmal atrial fibrillation on no medications or anticoagulation, hypertension on no medications, history of TIA, history of arthritis and prior knee surgeries. Patient was walking down the stairs at his home today when he missed the last step and fell onto his right knee. Presented to the ER with significant pain. X-ray revealed a comminuted supracondylar femur fracture on the right. Patient has been evaluated by orthopedic surgery and plans are to pursue surgical intervention on 7/8.  ED Course:  Vital Signs: BP (!) 141/79 (BP Location: Right Arm)   Pulse 79   Temp 97.8 F (36.6 C) (Oral)   Resp (!) 21   SpO2 97%  DG Knee: Comminuted displaced fracture distal femur extending nearly to the level of the knee replacement hardware DG Femur: Comminuted displaced fracture of the distal femur as described in the knee x-ray, proximal femur intact PCXR: Neg Lab data: Sodium 138, potassium 3.5, chloride 103, CO2 26, glucose 139, BUN 11, creatinine 0.4, calcium 8.7, white count 9300 with neutrophils 81%, neutrophils 7.5%, hemoglobin 12.3, platelets 153,000; PT 15.3, INR 1.2 Medications and treatments: Morphine 4 mg IV 1, Dilaudid 1 mg IV 2  Review of Systems:  In addition to the HPI above,  No Fever-chills, myalgias or other constitutional symptoms No Headache, changes with Vision or hearing, new weakness, tingling, numbness in any extremity, dizziness, dysarthria or word finding difficulty, gait disturbance or imbalance, tremors or seizure activity No problems swallowing food or Liquids, indigestion/reflux, choking or coughing while eating, abdominal pain with or after eating No Chest  pain, Cough or Shortness of Breath, palpitations, orthopnea or DOE No Abdominal pain, N/V, melena,hematochezia, dark tarry stools, constipation No dysuria, malodorous urine, hematuria or flank pain No new skin rashes, lesions, masses or bruises, No new joint pains, aches, swelling or redness No recent unintentional weight gain or loss No polyuria, polydypsia or polyphagia   Past Medical History:  Diagnosis Date  . Arthritis   . Atrial fibrillation ([place])   . History of lung or bronchial cancer   . Hypertension   . Lymphoma ([place])   . TIA (transient ischemic attack)     Past Surgical History:  Procedure Laterality Date  . BACK SURGERY    . CHOLECYSTECTOMY    . HERNIA REPAIR    . PNEUMONECTOMY    . REPLACEMENT TOTAL KNEE BILATERAL    . ROTATOR CUFF REPAIR      Social History   Social History  . Marital status: Married    Spouse name: N/A  . Number of children: N/A  . Years of education: N/A   Occupational History  . Not on file.   Social History Main Topics  . Smoking status: Former Research scientist (life sciences)  . Smokeless tobacco: Never Used  . Alcohol use No  . Drug use: No  . Sexual activity: Not on file   Other Topics Concern  . Not on file   Social History Narrative  . No narrative on file    Mobility: Utilizes a rolling walker Work history: Not obtained   Allergies  Allergen Reactions  . Sulfa Antibiotics Hives    Family history reviewed and not pertinent Current admission findings, diagnosis and planning care  Prior  to Admission medications   Medication Sig Start Date End Date Taking? Authorizing Provider  ibuprofen (ADVIL,MOTRIN) 200 MG tablet Take 200 mg by mouth every 6 (six) hours as needed for headache or moderate pain.   Yes [provider]    Physical Exam: Vitals:   05/14/17 1515 05/14/17 1530 05/14/17 1530 05/14/17 1548  BP: 130/71 125/75  (!) 141/79  Pulse: 71 73  79  Resp: 16 12  (!) 21  Temp:      TempSrc:      SpO2: 90% 90% 91% 97%       Constitutional: NAD, calm, comfortable Eyes: PERRL, lids and conjunctivae normal ENMT: Mucous membranes are moist. Posterior pharynx clear of any exudate or lesions.Normal dentition.  Neck: normal, supple, no masses, no thyromegaly Respiratory: clear to auscultation bilaterally, no wheezing, no crackles. Normal respiratory effort. No accessory muscle use.  Cardiovascular: Regular rate and rhythm, no murmurs / rubs / gallops. No extremity edema. 2+ pedal pulses. No carotid bruits.  Abdomen: no tenderness, no masses palpated. No hepatosplenomegaly. Bowel sounds positive.  Musculoskeletal: no clubbing / cyanosis. No joint deformity upper and lower extremities. Good ROM (right lower extremity not tested secondary to acute fracture), no contractures. Normal muscle tone. Knee immobilizer in place right leg Skin: no rashes, lesions, ulcers. No induration Neurologic: CN 2-12 grossly intact. Sensation intact, DTR normal. Strength 5/5 x all extremities-RLE not tested secondary to acute fracture.  Psychiatric: Normal judgment and insight. Alert and oriented x 3. Normal mood.    Labs on Admission: I have personally reviewed following labs and imaging studies  CBC:  Recent Labs Lab 05/14/17 1146  WBC 9.3  NEUTROABS 7.5  HGB 12.3*  HCT 36.8*  MCV 95.1  PLT 449   Basic Metabolic Panel:  Recent Labs Lab 05/14/17 1146  NA 138  K 3.5  CL 103  CO2 26  GLUCOSE 139*  BUN 11  CREATININE 0.84  CALCIUM 8.7*   GFR: CrCl cannot be calculated (Unknown ideal weight.). Liver Function Tests: No results for input(s): AST, ALT, ALKPHOS, BILITOT, PROT, ALBUMIN in the last 168 hours. No results for input(s): LIPASE, AMYLASE in the last 168 hours. No results for input(s): AMMONIA in the last 168 hours. Coagulation Profile:  Recent Labs Lab 05/14/17 1146  INR 1.21   Cardiac Enzymes: No results for input(s): CKTOTAL, CKMB, CKMBINDEX, TROPONINI in the last 168 hours. BNP (last 3  results) No results for input(s): PROBNP in the last 8760 hours. HbA1C: No results for input(s): HGBA1C in the last 72 hours. CBG: No results for input(s): GLUCAP in the last 168 hours. Lipid Profile: No results for input(s): CHOL, HDL, LDLCALC, TRIG, CHOLHDL, LDLDIRECT in the last 72 hours. Thyroid Function Tests: No results for input(s): TSH, T4TOTAL, FREET4, T3FREE, THYROIDAB in the last 72 hours. Anemia Panel: No results for input(s): VITAMINB12, FOLATE, FERRITIN, TIBC, IRON, RETICCTPCT in the last 72 hours. Urine analysis:    Component Value Date/Time   COLORURINE YELLOW 02/15/2015 1513   APPEARANCEUR CLEAR 02/15/2015 1513   LABSPEC 1.009 02/15/2015 1513   PHURINE 6.5 02/15/2015 1513   GLUCOSEU NEGATIVE 02/15/2015 1513   HGBUR NEGATIVE 02/15/2015 1513   BILIRUBINUR NEGATIVE 02/15/2015 1513   KETONESUR NEGATIVE 02/15/2015 1513   PROTEINUR NEGATIVE 02/15/2015 1513   UROBILINOGEN 0.2 02/15/2015 1513   NITRITE NEGATIVE 02/15/2015 1513   LEUKOCYTESUR NEGATIVE 02/15/2015 1513   Sepsis Labs: @LABRCNTIP (procalcitonin:4,lacticidven:4) )No results found for this or any previous visit (from the past 240 hour(s)).  Radiological Exams on Admission: Dg Chest 1 View  Result Date: 05/14/2017 CLINICAL DATA:  Leg pain after fall. EXAM: CHEST 1 VIEW COMPARISON:  June 06, 2009 FINDINGS: The patient is status post left pneumonectomy with complete opacification of the left hemithorax. No focal infiltrate identified. No acute interval changes. IMPRESSION: No acute abnormality. Electronically Signed   By: Dorise Bullion III M.D   On: 05/14/2017 12:34   Dg Knee Complete 4 Views Right  Result Date: 05/14/2017 CLINICAL DATA:  Pain after fall. EXAM: RIGHT KNEE - COMPLETE 4+ VIEW COMPARISON:  None. FINDINGS: The patient is status post knee replacement. The proximal fibula and tibia are intact. There is a comminuted displaced fracture of the distal femur. The fracture line extends nearly to the level  of the knee replacement hardware in the distal femur. 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 IMPRESSION: Comminuted displaced fracture of the distal femur extending nearly to the level of the knee replacement hardware. Lipoma hemarthrosis. Electronically Signed   By: Dorise Bullion III M.D   On: 05/14/2017 12:31   Dg Femur, Min 2 Views Right  Result Date: 05/14/2017 CLINICAL DATA:  Pain after fall EXAM: RIGHT FEMUR 2 VIEWS COMPARISON:  None. FINDINGS: Comminuted displaced fracture of the distal femur as described on the knee x-ray. IMPRESSION: Comminuted displaced fracture of the distal femur as described on the knee x-ray. The proximal femur is intact. Electronically Signed   By: Dorise Bullion III M.D   On: 05/14/2017 12:32    EKG: (Independently reviewed) sinus rhythm with ventricular rate 70 bpm, QTC 490 ms, short PR interval with normal R-wave regurgitation and no acute ischemic changes  Assessment/Plan Principal Problem:   Closed comminuted supracondylar fracture of right femur  -Patient presents with acute femur fracture after mechanical fall requiring surgical repair -Dr. Sharol Given plans operative intervention on 7/8 [facility] Peri Operative cardiac risk: He has a 0.46% estimated risk probability for perioperative myocardial infarction or cardiac arrest -Hip fracture orders that initiated -1 time dose Lovenox 40 mg subcutaneous tonight then further DVT prophylaxis and the discretion of orthopedic surgeon -Oral and/or IV narcotics for pain management -NPO after midnight -Postoperative PT/OT evaluation to determine discharge disposition  Active Problems:   PAF (paroxysmal atrial fibrillation)  -Currently maintaining sinus rhythm -Does not take rate control or antiarrhythmic medications -Has not ever required anticoagulation    Hypertension -Controlled off medication    History of lung or bronchial cancer -History of small cell lung cancer -Currently stable  and only requiring oxygen in setting of hypoventilation after narcotic pain medications      DVT prophylaxis: Lovenox 1 dose then at discretion of orthopedic surgeon Code Status: Full  Family Communication: Wife/family Disposition Plan: TBD after postoperative PT/OT evaluation Consults called: Orthopedic surgeon/[place]    Samella Parr ANP-BC [facility] Pager [phone]   If 7PM-7AM, please contact night-coverage [email or web address] Password [facility]  05/14/2017, 3:56 PM

## 2017-05-14 NOTE — ED Notes (Signed)
Admitting MD at bedside.

## 2017-05-14 NOTE — ED Triage Notes (Signed)
PT Reported he missed a step when walking down the steps in the garage and fell on RT knee. Pt has had a total knee replacement to same knee. Pt unable to straighten RFT knee out. Pt arrived with knee propped up on 3 pillows. Per EMS Pt received  A total of 100 mc of fentanyl fr pain  . Vitals per EMS BP-200/106 , HR- 72, 02% 97 on RA.

## 2017-05-14 NOTE — ED Triage Notes (Signed)
PT reports the knee continues  To hurt to much to place knee immobilizer .

## 2017-05-14 NOTE — ED Notes (Signed)
Patient placed on 2L O2 [place] d/t SpO2 dropping to high 80s when sleeping and following pain med administration. SpO2 increased to 97%.

## 2017-05-15 ENCOUNTER — Inpatient Hospital Stay (HOSPITAL_COMMUNITY): Payer: Medicare Other

## 2017-05-15 ENCOUNTER — Encounter (HOSPITAL_COMMUNITY)
Admission: EM | Disposition: A | Discharge status: Skilled Nursing Facility | Payer: Self-pay | Source: Home / Self Care | Attending: Internal Medicine

## 2017-05-15 ENCOUNTER — Inpatient Hospital Stay (HOSPITAL_COMMUNITY): Payer: Medicare Other | Admitting: Anesthesiology

## 2017-05-15 ENCOUNTER — Encounter (HOSPITAL_COMMUNITY): Payer: Self-pay | Admitting: Anesthesiology

## 2017-05-15 DIAGNOSIS — S72491A Other fracture of lower end of right femur, initial encounter for closed fracture: Secondary | ICD-10-CM

## 2017-05-15 DIAGNOSIS — W19XXXD Unspecified fall, subsequent encounter: Secondary | ICD-10-CM

## 2017-05-15 DIAGNOSIS — Z85118 Personal history of other malignant neoplasm of bronchus and lung: Secondary | ICD-10-CM

## 2017-05-15 HISTORY — PX: ORIF FEMUR FRACTURE: SHX2119

## 2017-05-15 LAB — BASIC METABOLIC PANEL
ANION GAP: 8 (ref 5–15)
BUN: 14 mg/dL (ref 6–20)
CALCIUM: 8.5 mg/dL — AB (ref 8.9–10.3)
CO2: 26 mmol/L (ref 22–32)
Chloride: 98 mmol/L — ABNORMAL LOW (ref 101–111)
Creatinine, Ser: 0.92 mg/dL (ref 0.61–1.24)
GFR calc Af Amer: >60 mL/min (ref >60)
GLUCOSE: 118 mg/dL — AB (ref 65–99)
Potassium: 3.5 mmol/L (ref 3.5–5.1)
Sodium: 132 mmol/L — ABNORMAL LOW (ref 135–145)

## 2017-05-15 LAB — CBC
HEMATOCRIT: 32.5 % — AB (ref 39.0–52.0)
Hemoglobin: 10.5 g/dL — ABNORMAL LOW (ref 13.0–17.0)
MCH: 31.1 pg (ref 26.0–34.0)
MCHC: 32.3 g/dL (ref 30.0–36.0)
MCV: 96.2 fL (ref 78.0–100.0)
PLATELETS: 157 10*3/uL (ref 150–400)
RBC: 3.38 MIL/uL — ABNORMAL LOW (ref 4.22–5.81)
RDW: 13.9 % (ref 11.5–15.5)
WBC: 8 10*3/uL (ref 4.0–10.5)

## 2017-05-15 SURGERY — OPEN REDUCTION INTERNAL FIXATION (ORIF) DISTAL FEMUR FRACTURE
Anesthesia: General | Site: Leg Upper | Laterality: Right

## 2017-05-15 MED ORDER — PHENYLEPHRINE HCL 10 MG/ML IJ SOLN
INTRAMUSCULAR | Status: DC | PRN
  Administered 2017-05-15: 50 ug/min via INTRAVENOUS

## 2017-05-15 MED ORDER — ASPIRIN EC 325 MG PO TBEC
325.0000 mg | DELAYED_RELEASE_TABLET | Freq: Every day | ORAL | Status: DC
  Administered 2017-05-16 – 2017-05-17 (×2): 325 mg via ORAL
  Filled 2017-05-15 (×2): qty 1

## 2017-05-15 MED ORDER — ONDANSETRON HCL 4 MG PO TABS: 4.0000 mg | ORAL_TABLET | Freq: Four times a day (QID) | ORAL | Status: DC | PRN

## 2017-05-15 MED ORDER — ENOXAPARIN SODIUM 40 MG/0.4ML ~~LOC~~ SOLN: 40.0000 mg | SUBCUTANEOUS | Status: DC

## 2017-05-15 MED ORDER — SUGAMMADEX SODIUM 200 MG/2ML IV SOLN
INTRAVENOUS | Status: DC | PRN
  Administered 2017-05-15: 195 mg via INTRAVENOUS

## 2017-05-15 MED ORDER — FENTANYL CITRATE (PF) 100 MCG/2ML IJ SOLN
INTRAMUSCULAR | Status: AC
  Filled 2017-05-15: qty 2

## 2017-05-15 MED ORDER — LACTATED RINGERS IV SOLN
INTRAVENOUS | Status: DC | PRN
  Administered 2017-05-15 (×2): via INTRAVENOUS

## 2017-05-15 MED ORDER — CHLORHEXIDINE GLUCONATE CLOTH 2 % EX PADS
6.0000 | MEDICATED_PAD | Freq: Every day | CUTANEOUS | Status: DC
  Administered 2017-05-16 – 2017-05-17 (×2): 6 via TOPICAL

## 2017-05-15 MED ORDER — CEFAZOLIN SODIUM-DEXTROSE 2-4 GM/100ML-% IV SOLN
2.0000 g | Freq: Four times a day (QID) | INTRAVENOUS | Status: AC
  Administered 2017-05-15 (×2): 2 g via INTRAVENOUS
  Filled 2017-05-15 (×2): qty 100

## 2017-05-15 MED ORDER — HYDROCODONE-ACETAMINOPHEN 5-325 MG PO TABS
1.0000 | ORAL_TABLET | Freq: Four times a day (QID) | ORAL | Status: DC | PRN
  Administered 2017-05-15: 2 via ORAL
  Administered 2017-05-15 – 2017-05-16 (×2): 1 via ORAL
  Administered 2017-05-16: 2 via ORAL
  Filled 2017-05-15 (×2): qty 2
  Filled 2017-05-15: qty 1

## 2017-05-15 MED ORDER — ONDANSETRON HCL 4 MG/2ML IJ SOLN: 4.0000 mg | Freq: Four times a day (QID) | INTRAMUSCULAR | Status: DC | PRN

## 2017-05-15 MED ORDER — EPHEDRINE SULFATE 50 MG/ML IJ SOLN
INTRAMUSCULAR | Status: DC | PRN
  Administered 2017-05-15: 10 mg via INTRAVENOUS

## 2017-05-15 MED ORDER — ROCURONIUM BROMIDE 100 MG/10ML IV SOLN
INTRAVENOUS | Status: DC | PRN
  Administered 2017-05-15: 40 mg via INTRAVENOUS
  Administered 2017-05-15: 10 mg via INTRAVENOUS

## 2017-05-15 MED ORDER — LIDOCAINE HCL (CARDIAC) 20 MG/ML IV SOLN
INTRAVENOUS | Status: DC | PRN
  Administered 2017-05-15: 30 mg via INTRAVENOUS

## 2017-05-15 MED ORDER — FENTANYL CITRATE (PF) 250 MCG/5ML IJ SOLN
INTRAMUSCULAR | Status: AC
  Filled 2017-05-15: qty 5

## 2017-05-15 MED ORDER — HYDROCODONE-ACETAMINOPHEN 5-325 MG PO TABS
ORAL_TABLET | ORAL | Status: AC
  Filled 2017-05-15: qty 1

## 2017-05-15 MED ORDER — ACETAMINOPHEN 500 MG PO TABS: 500.0000 mg | ORAL_TABLET | Freq: Four times a day (QID) | ORAL | 0 refills | Status: DC | PRN

## 2017-05-15 MED ORDER — ONDANSETRON HCL 4 MG/2ML IJ SOLN
INTRAMUSCULAR | Status: DC | PRN
  Administered 2017-05-15: 4 mg via INTRAVENOUS

## 2017-05-15 MED ORDER — PROPOFOL 10 MG/ML IV BOLUS
INTRAVENOUS | Status: AC
  Filled 2017-05-15: qty 40

## 2017-05-15 MED ORDER — METOCLOPRAMIDE HCL 5 MG/ML IJ SOLN: 5.0000 mg | Freq: Three times a day (TID) | INTRAMUSCULAR | Status: DC | PRN

## 2017-05-15 MED ORDER — 0.9 % SODIUM CHLORIDE (POUR BTL) OPTIME
TOPICAL | Status: DC | PRN
  Administered 2017-05-15: 1000 mL

## 2017-05-15 MED ORDER — FENTANYL CITRATE (PF) 100 MCG/2ML IJ SOLN
INTRAMUSCULAR | Status: DC | PRN
  Administered 2017-05-15: 50 ug via INTRAVENOUS
  Administered 2017-05-15 (×2): 100 ug via INTRAVENOUS

## 2017-05-15 MED ORDER — MENTHOL 3 MG MT LOZG: 1.0000 | LOZENGE | OROMUCOSAL | Status: DC | PRN

## 2017-05-15 MED ORDER — ACETAMINOPHEN 325 MG PO TABS
650.0000 mg | ORAL_TABLET | Freq: Four times a day (QID) | ORAL | Status: DC | PRN
  Administered 2017-05-17: 650 mg via ORAL
  Filled 2017-05-15: qty 2

## 2017-05-15 MED ORDER — FENTANYL CITRATE (PF) 100 MCG/2ML IJ SOLN
25.0000 ug | INTRAMUSCULAR | Status: DC | PRN
  Administered 2017-05-15: 25 ug via INTRAVENOUS

## 2017-05-15 MED ORDER — ASPIRIN EC 81 MG PO TBEC: 81.0000 mg | DELAYED_RELEASE_TABLET | Freq: Every day | ORAL | 1 refills | Status: DC

## 2017-05-15 MED ORDER — ACETAMINOPHEN 650 MG RE SUPP: 650.0000 mg | Freq: Four times a day (QID) | RECTAL | Status: DC | PRN

## 2017-05-15 MED ORDER — PHENYLEPHRINE HCL 10 MG/ML IJ SOLN
INTRAMUSCULAR | Status: DC | PRN
  Administered 2017-05-15: 80 ug via INTRAVENOUS

## 2017-05-15 MED ORDER — PHENOL 1.4 % MT LIQD: 1.0000 | OROMUCOSAL | Status: DC | PRN

## 2017-05-15 MED ORDER — SODIUM CHLORIDE 0.9 % IV SOLN
INTRAVENOUS | Status: DC
  Administered 2017-05-15: 11:00:00 via INTRAVENOUS

## 2017-05-15 MED ORDER — METOCLOPRAMIDE HCL 5 MG PO TABS: 5.0000 mg | ORAL_TABLET | Freq: Three times a day (TID) | ORAL | Status: DC | PRN

## 2017-05-15 MED ORDER — PROPOFOL 10 MG/ML IV BOLUS
INTRAVENOUS | Status: DC | PRN
  Administered 2017-05-15: 120 mg via INTRAVENOUS

## 2017-05-15 MED ORDER — MUPIROCIN 2 % EX OINT
1.0000 "application " | TOPICAL_OINTMENT | Freq: Two times a day (BID) | CUTANEOUS | Status: DC
  Administered 2017-05-15 – 2017-05-17 (×5): 1 via NASAL
  Filled 2017-05-15: qty 22

## 2017-05-15 MED ORDER — MORPHINE SULFATE (PF) 4 MG/ML IV SOLN
0.5000 mg | INTRAVENOUS | Status: DC | PRN
Start: 2017-05-15 — End: 2017-05-17

## 2017-05-15 SURGICAL SUPPLY — 74 items
BIT DRILL CALIBRATED 3.2MM (DRILL) IMPLANT
BIT DRILL PERCUTANEOUS 4.3 (BIT) ×1
BIT DRILL PERCUTANEOUS 4.3MM (BIT) IMPLANT
BIT DRILL Q/COUPLING 1 (BIT) ×2 IMPLANT
BLADE CLIPPER SURG (BLADE) IMPLANT
BLADE SURG 10 STRL SS (BLADE) ×3 IMPLANT
BNDG COHESIVE 6X5 TAN STRL LF (GAUZE/BANDAGES/DRESSINGS) ×4 IMPLANT
BNDG GAUZE ELAST 4 BULKY (GAUZE/BANDAGES/DRESSINGS) ×1 IMPLANT
CANISTER WOUND CARE 500ML ATS (WOUND CARE) ×2 IMPLANT
CLEANER TIP ELECTROSURG 2X2 (MISCELLANEOUS) ×1 IMPLANT
COVER MAYO STAND STRL (DRAPES) ×3 IMPLANT
COVER SURGICAL LIGHT HANDLE (MISCELLANEOUS) ×6 IMPLANT
CUFF TOURNIQUET SINGLE 34IN LL (TOURNIQUET CUFF) IMPLANT
CUFF TOURNIQUET SINGLE 44IN (TOURNIQUET CUFF) IMPLANT
DRAPE C-ARM 42X72 X-RAY (DRAPES) ×2 IMPLANT
DRAPE IMP U-DRAPE 54X76 (DRAPES) ×3 IMPLANT
DRAPE INCISE IOBAN 66X45 STRL (DRAPES) ×3 IMPLANT
DRAPE ORTHO SPLIT 77X108 STRL (DRAPES) ×3
DRAPE STERI IOBAN 125X83 (DRAPES) ×2 IMPLANT
DRAPE SURG ORHT 6 SPLT 77X108 (DRAPES) ×1 IMPLANT
DRAPE U-SHAPE 47X51 STRL (DRAPES) ×1 IMPLANT
DRESSING VERAFLO CLEANSE CC (GAUZE/BANDAGES/DRESSINGS) IMPLANT
DRILL BIT PERCUTANEOUS 4.3MM (BIT) ×3
DRILL CALIBRATED 3.2MM (DRILL) ×3
DRSG ADAPTIC 3X8 NADH LF (GAUZE/BANDAGES/DRESSINGS) ×3 IMPLANT
DRSG PAD ABDOMINAL 8X10 ST (GAUZE/BANDAGES/DRESSINGS) ×4 IMPLANT
DRSG VERAFLO CLEANSE CC (GAUZE/BANDAGES/DRESSINGS) ×3
DURAPREP 26ML APPLICATOR (WOUND CARE) ×3 IMPLANT
ELECT REM PT RETURN 9FT ADLT (ELECTROSURGICAL) ×3
ELECTRODE REM PT RTRN 9FT ADLT (ELECTROSURGICAL) ×1 IMPLANT
EVACUATOR 1/8 PVC DRAIN (DRAIN) IMPLANT
GAUZE SPONGE 4X4 12PLY STRL (GAUZE/BANDAGES/DRESSINGS) ×4 IMPLANT
GAUZE SPONGE 4X4 12PLY STRL LF (GAUZE/BANDAGES/DRESSINGS) ×2 IMPLANT
GLOVE BIOGEL PI IND STRL 7.0 (GLOVE) IMPLANT
GLOVE BIOGEL PI IND STRL 9 (GLOVE) ×1 IMPLANT
GLOVE BIOGEL PI INDICATOR 7.0 (GLOVE) ×4
GLOVE BIOGEL PI INDICATOR 9 (GLOVE) ×2
GLOVE ECLIPSE 7.0 STRL STRAW (GLOVE) ×2 IMPLANT
GLOVE SURG ORTHO 9.0 STRL STRW (GLOVE) ×3 IMPLANT
GOWN STRL REUS W/ TWL XL LVL3 (GOWN DISPOSABLE) ×3 IMPLANT
GOWN STRL REUS W/TWL XL LVL3 (GOWN DISPOSABLE) ×3
KIT BASIN OR (CUSTOM PROCEDURE TRAY) ×3 IMPLANT
KIT ROOM TURNOVER OR (KITS) ×3 IMPLANT
MANIFOLD NEPTUNE II (INSTRUMENTS) ×1 IMPLANT
NEEDLE 22X1 1/2 (OR ONLY) (NEEDLE) ×1 IMPLANT
NS IRRIG 1000ML POUR BTL (IV SOLUTION) ×3 IMPLANT
PACK ORTHO EXTREMITY (CUSTOM PROCEDURE TRAY) ×3 IMPLANT
PACK UNIVERSAL I (CUSTOM PROCEDURE TRAY) ×1 IMPLANT
PAD ARMBOARD 7.5X6 YLW CONV (MISCELLANEOUS) ×6 IMPLANT
PLATE CONDYLAR CVD 8H RT (Plate) ×2 IMPLANT
SCREW CORTEX ST 4.5X36 (Screw) ×6 IMPLANT
SCREW CORTEX ST 4.5X38 (Screw) ×2 IMPLANT
SCREW CORTEX ST 4.5X60 (Screw) ×2 IMPLANT
SCREW LOCK 5.0X80 (Screw) ×2 IMPLANT
SCREW LOCKING VA 5.0X60MM (Screw) ×2 IMPLANT
SCREW LOCKING VA 5.0X75MM (Screw) ×2 IMPLANT
SPONGE LAP 18X18 X RAY DECT (DISPOSABLE) ×3 IMPLANT
STAPLER VISISTAT 35W (STAPLE) ×2 IMPLANT
STOCKINETTE IMPERVIOUS LG (DRAPES) ×3 IMPLANT
SUCTION FRAZIER HANDLE 10FR (MISCELLANEOUS) ×2
SUCTION TUBE FRAZIER 10FR DISP (MISCELLANEOUS) ×1 IMPLANT
SUT ETHILON 2 0 PSLX (SUTURE) IMPLANT
SUT ETHILON 4 0 PS 2 18 (SUTURE) IMPLANT
SUT VIC AB 0 CTB1 27 (SUTURE) ×1 IMPLANT
SUT VIC AB 1 CTB1 27 (SUTURE) ×5 IMPLANT
SUT VIC AB 2-0 CTB1 (SUTURE) ×3 IMPLANT
SYR 20ML ECCENTRIC (SYRINGE) ×1 IMPLANT
TAPE CLOTH SURG 6X10 WHT LF (GAUZE/BANDAGES/DRESSINGS) ×2 IMPLANT
TOWEL OR 17X24 6PK STRL BLUE (TOWEL DISPOSABLE) ×1 IMPLANT
TOWEL OR 17X26 10 PK STRL BLUE (TOWEL DISPOSABLE) ×3 IMPLANT
TUBE CONNECTING 12'X1/4 (SUCTIONS) ×1
TUBE CONNECTING 12X1/4 (SUCTIONS) ×2 IMPLANT
WATER STERILE IRR 1000ML POUR (IV SOLUTION) ×6 IMPLANT
YANKAUER SUCT BULB TIP NO VENT (SUCTIONS) ×3 IMPLANT

## 2017-05-15 NOTE — Op Note (Signed)
05/14/2017 - 05/15/2017  9:27 AM  PATIENT:  Craig Chapman    PRE-OPERATIVE DIAGNOSIS:  Right distal femur fracture  POST-OPERATIVE DIAGNOSIS:  Same  PROCEDURE:  OPEN REDUCTION INTERNAL FIXATION (ORIF) DISTAL FEMUR FRACTURE C-arm fluoroscopy. Prevena wound VAC.   SURGEON:  Newt Minion, MD  PHYSICIAN ASSISTANT:None ANESTHESIA:   General  PREOPERATIVE INDICATIONS:  Craig Chapman is a  83 y.o. male with a diagnosis of Right distal femur fracture who failed conservative measures and elected for surgical management.    The risks benefits and alternatives were discussed with the patient preoperatively including but not limited to the risks of infection, bleeding, nerve injury, cardiopulmonary complications, the need for revision surgery, among others, and the patient was willing to proceed.  OPERATIVE IMPLANTS: 8 hole supracondylar femur plate synthes  OPERATIVE FINDINGS: Thin osteoporotic bone and thin tensor fascia lata  OPERATIVE PROCEDURE: Patient was brought to the operating room and underwent a general anesthetic. After adequate levels anesthesia obtained patient's placed on the [facility] fracture table the right lower extremity was placed in boot traction the fracture was reduced. The right lower extremity was then prepped using DuraPrep draped into a sterile field with the shower curtain a timeout was called. A lateral incision was made over the femur this was carried down the tensor fascia lata was incised longitudinally the vastus lateralis was elevated electrocautery was used for hemostasis for the perforating vessels. Bennett retractors were placed the fracture was reduced the 8 hole Synthes plate was aligned laterally and secured distally with a compression screw. C-arm fluoroscopy verified alignment the plate was then used for reduction of the fracture a proximal screw was placed in compression the fracture was further reduced and then a second compression screw was placed  proximally. C-arm fluoroscopy verified alignment both AP and lateral planes. 4 compression screws were placed proximally for 8 holes of compression and 4 screws placed distally with locking screws 3. C-arm floss be verified alignment. The wound was irrigated with normal saline. The tensor fascia lata was closed using #1 Vicryl. Subcutaneous is closed using 2-0 Vicryl. The skin was closed using staples. A Prevena wound VAC was applied this had a good suction fit patient was extubated taken to the PACU in stable condition.

## 2017-05-15 NOTE — Anesthesia Postprocedure Evaluation (Signed)
Anesthesia Post Note  Patient: Craig Chapman  Procedure(s) Performed: Procedure(s) (LRB): OPEN REDUCTION INTERNAL FIXATION (ORIF) DISTAL FEMUR FRACTURE (Right)     Patient location during evaluation: PACU Anesthesia Type: General Level of consciousness: awake and alert Pain management: pain level controlled Vital Signs Assessment: post-procedure vital signs reviewed and stable Respiratory status: spontaneous breathing, nonlabored ventilation, respiratory function stable and patient connected to nasal cannula oxygen Cardiovascular status: blood pressure returned to baseline and stable Postop Assessment: no signs of nausea or vomiting Anesthetic complications: no    Last Vitals:  Vitals:   05/15/17 1010 05/15/17 1025  BP: 126/77 122/67  Pulse: 74 72  Resp: (!) 22 14  Temp:  (!) 36.4 C    Last Pain:  Vitals:   05/15/17 1100  TempSrc:   PainSc: Asleep                 Effie Berkshire

## 2017-05-15 NOTE — Anesthesia Preprocedure Evaluation (Signed)
Anesthesia Evaluation    Airway Mallampati: III  TM Distance: >3 FB Neck ROM: Full    Dental  (+) Teeth Intact, Dental Advisory Given   Pulmonary former smoker,    breath sounds clear to auscultation       Cardiovascular hypertension, + dysrhythmias Atrial Fibrillation  Rhythm:Regular Rate:Normal     Neuro/Psych TIAnegative psych ROS   GI/Hepatic negative GI ROS, Neg liver ROS,   Endo/Other  negative endocrine ROS  Renal/GU negative Renal ROS  negative genitourinary   Musculoskeletal  (+) Arthritis , Osteoarthritis,    Abdominal   Peds  Hematology   Anesthesia Other Findings - h/o pneumonectomy (does not require O2 supplementation  Reproductive/Obstetrics                             Lab Results  Component Value Date   WBC 8.0 05/15/2017   HGB 10.5 (L) 05/15/2017   HCT 32.5 (L) 05/15/2017   MCV 96.2 05/15/2017   PLT 157 05/15/2017   Lab Results  Component Value Date   CREATININE 0.92 05/15/2017   BUN 14 05/15/2017   NA 132 (L) 05/15/2017   K 3.5 05/15/2017   CL 98 (L) 05/15/2017   CO2 26 05/15/2017   Lab Results  Component Value Date   INR 1.21 05/14/2017   INR 1.18 02/15/2015   INR 1.17 08/27/2009   EKG: normal sinus rhythm.  Anesthesia Physical Anesthesia Plan  ASA: III  Anesthesia Plan: General   Post-op Pain Management:    Induction: Intravenous  PONV Risk Score and Plan: 3 and Ondansetron, Dexamethasone, Propofol and Midazolam  Airway Management Planned: Oral ETT  Additional Equipment:   Intra-op Plan:   Post-operative Plan: Extubation in OR  Informed Consent: I have reviewed the patients History and Physical, chart, labs and discussed the procedure including the risks, benefits and alternatives for the proposed anesthesia with the patient or authorized representative who has indicated his/her understanding and acceptance.   Dental advisory given  Plan  Discussed with: CRNA  Anesthesia Plan Comments:         Anesthesia Quick Evaluation

## 2017-05-15 NOTE — Transfer of Care (Signed)
Immediate Anesthesia Transfer of Care Note  Patient: Craig Chapman  Procedure(s) Performed: Procedure(s): OPEN REDUCTION INTERNAL FIXATION (ORIF) DISTAL FEMUR FRACTURE (Right)  Patient Location: PACU  Anesthesia Type:General  Level of Consciousness: awake and alert   Airway & Oxygen Therapy: Patient Spontanous Breathing and Patient connected to nasal cannula oxygen  Post-op Assessment: Report given to RN and Post -op Vital signs reviewed and stable  Post vital signs: Reviewed and stable  Last Vitals:  Vitals:   05/14/17 1944 05/15/17 0300  BP: 130/63 129/68  Pulse: 66 68  Resp: 18 19  Temp: 36.4 C 37.1 C    Last Pain:  Vitals:   05/15/17 0300  TempSrc: Oral  PainSc:          Complications: No apparent anesthesia complications

## 2017-05-15 NOTE — Progress Notes (Signed)
[facility]                                                                              Patient Demographics  Craig Chapman, is a 83 y.o. male, DOB - Feb 13, 1928, UMP:[id number]  Admit date - 05/14/2017   Admitting Physician Elwin Mocha, MD  Outpatient Primary MD for the patient is Patient, No Pcp Per  Outpatient specialists:   LOS - 1  days   Medical records reviewed and are as summarized below:    Chief Complaint  Patient presents with  . Knee Injury       Brief summary   Patient is a 83 year old male with paroxysmal atrial fibrillation on no anticoagulation, hypertension, history of TIA, presented with mechanical fall. Patient was walking down the stairs when he missed the last step and fell onto his right knee. X-ray revealed a comminuted supracondylar femur fracture on the right.   Assessment & Plan    Principal Problem:   Closed comminuted supracondylar fracture of right femur ([place]) - Presented after a mechanical fall and missing a step on the stairs. X-rays revealed a comminuted supracondylar femur fracture on the right - Patient underwent open reduction and internal fixation of distal femur fracture today - Seen after surgery, doing well .- Pain control and DVT prophylaxis per surgery - Continue gentle hydration and advance diet as tolerated - PTOT evaluation from tomorrow, likely will need skilled nursing study for rehabilitation, discussed with family  Active Problems:   PAF (paroxysmal atrial fibrillation) (HCC) - Currently in sinus rhythm, not on any antiarrhythmic medications - Not on anticoagulation    Hypertension - Stable    History of lung or bronchial cancer - History of small cell lung CA, currently stable   Code Status: Full CODE STATUS DVT Prophylaxis:  Will await orthopedic recommendations regarding DVT prophylaxis Family Communication: Discussed in detail with the patient, all imaging results, lab results explained to  the patient , wife and other family members in the room  Disposition Plan: Will need skilled nursing facility  Time Spent in minutes  25 minutes  Procedures:  ORIF  Consultants:   Orthopedics  Antimicrobials:      Medications  Scheduled Meds: . [START ON 05/16/2017] aspirin EC  325 mg Oral Q breakfast  . Chlorhexidine Gluconate Cloth  6 each Topical Daily  . fentaNYL      . HYDROcodone-acetaminophen      . mupirocin ointment  1 application Nasal BID   Continuous Infusions: . sodium chloride 10 mL/hr at 05/15/17 1045  .  ceFAZolin (ANCEF) IV     PRN Meds:.acetaminophen **OR** acetaminophen, HYDROcodone-acetaminophen, menthol-cetylpyridinium **OR** phenol, metoCLOPramide **OR** metoCLOPramide (REGLAN) injection, morphine injection, ondansetron **OR** ondansetron (ZOFRAN) IV   Antibiotics   Anti-infectives    Start     Dose/Rate Route Frequency Ordered Stop   05/15/17 1400  ceFAZolin (ANCEF) IVPB 2g/100 mL premix     2 g 200 mL/hr over 30 Minutes Intravenous Every 6 hours 05/15/17 1037 05/16/17 0159   05/15/17 0600  ceFAZolin (ANCEF) IVPB 2g/100 mL premix     2 g 200 mL/hr over 30 Minutes Intravenous  On call to O.R. 05/14/17 1715 05/15/17 0800        Subjective:   Craig Chapman was seen and examined today. Patient denies dizziness, chest pain, shortness of breath, abdominal pain, N/V/D/C, new weakness, numbess, tingling. No acute events overnight.    Objective:   Vitals:   05/15/17 0955 05/15/17 1000 05/15/17 1010 05/15/17 1025  BP: (!) 146/77 (!) 146/77 126/77 122/67  Pulse: 79  74 72  Resp: (!) 21  (!) 22 14  Temp:    (!) 97.5 F (36.4 C)  TempSrc:      SpO2: 97%  94% 91%  Weight:      Height:        Intake/Output Summary (Last 24 hours) at 05/15/17 1301 Last data filed at 05/15/17 1100  Gross per 24 hour  Intake             1710 ml  Output              250 ml  Net             1460 ml     Wt Readings from Last 3 Encounters:  05/14/17 97.5 kg  (215 lb)     Exam  General: Alert and oriented x 3, NAD  Eyes: PERRLA, EOMI, Anicteric Sclera,  HEENT:  Atraumatic, normocephalic, normal oropharynx  Cardiovascular: S1 S2 auscultated, no rubs, murmurs or gallops. Regular rate and rhythm.  Respiratory: Clear to auscultation bilaterally, no wheezing, rales or rhonchi  Gastrointestinal: Soft, nontender, nondistended, + bowel sounds  Ext: no pedal edema bilaterally  Neuro: No new deficit  Musculoskeletal: No digital cyanosis, clubbing  Skin: No rashes  Psych: Normal affect and demeanor, alert and oriented x3    Data Reviewed:  I have personally reviewed following labs and imaging studies  Micro Results Recent Results (from the past 240 hour(s))  Surgical PCR screen     Status: Abnormal   Collection Time: 05/14/17  5:53 PM  Result Value Ref Range Status   MRSA, PCR NEGATIVE NEGATIVE Final   Staphylococcus aureus POSITIVE (A) NEGATIVE Final    Comment:        The Xpert SA Assay (FDA approved for NASAL specimens in patients over 64 years of age), is one component of a comprehensive surveillance program.  Test performance has been validated by [facility] for patients greater than or equal to 46 year old. It is not intended to diagnose infection nor to guide or monitor treatment.     Radiology Reports Dg Chest 1 View  Result Date: 05/14/2017 CLINICAL DATA:  Leg pain after fall. EXAM: CHEST 1 VIEW COMPARISON:  June 06, 2009 FINDINGS: The patient is status post left pneumonectomy with complete opacification of the left hemithorax. No focal infiltrate identified. No acute interval changes. IMPRESSION: No acute abnormality. Electronically Signed   By: Dorise Bullion III M.D   On: 05/14/2017 12:34   Dg Knee Complete 4 Views Right  Result Date: 05/14/2017 CLINICAL DATA:  Pain after fall. EXAM: RIGHT KNEE - COMPLETE 4+ VIEW COMPARISON:  None. FINDINGS: The patient is status post knee replacement. The proximal fibula and  tibia are intact. There is a comminuted displaced fracture of the distal femur. The fracture line extends nearly to the level of the knee replacement hardware in the distal femur. 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 IMPRESSION: Comminuted displaced fracture of the distal femur extending nearly to the level of the knee replacement hardware. Lipoma hemarthrosis. Electronically  Signed   By: Dorise Bullion III M.D   On: 05/14/2017 12:31   Dg Knee Right Port  Result Date: 05/14/2017 CLINICAL DATA:  Distal femur fracture.  Patient in leg brace. EXAM: PORTABLE RIGHT KNEE - 1-2 VIEW COMPARISON:  Knee radiograph 05/14/2017 FINDINGS: There is a comminuted displaced distal femur fracture. There is posterior displacement of the distal fracture fragments. Overlying soft tissue swelling. Patient status post knee arthroplasty. Note is made of a lipohemarthrosis. IMPRESSION: Comminuted displaced distal femur fracture. Electronically Signed   By: Lovey Newcomer M.D.   On: 05/14/2017 16:28   Dg C-arm 61-120 Min  Result Date: 05/15/2017 CLINICAL DATA:  Distal femur fracture.  ORIF. EXAM: RIGHT FEMUR 2 VIEWS; DG C-ARM 61-120 MIN COMPARISON:  05/14/2017. FINDINGS: Four intraoperative spot fluoro film show placement of a lateral cortical plate with 4 proximal interlocking screws. Distal interlocking screws not well visualized due to the existing knee replacement. IMPRESSION: Status post ORIF for comminuted distal femur fracture. No evidence for immediate hardware complications. Electronically Signed   By: Misty Stanley M.D.   On: 05/15/2017 09:32   Dg Femur, Min 2 Views Right  Result Date: 05/15/2017 CLINICAL DATA:  Distal femur fracture.  ORIF. EXAM: RIGHT FEMUR 2 VIEWS; DG C-ARM 61-120 MIN COMPARISON:  05/14/2017. FINDINGS: Four intraoperative spot fluoro film show placement of a lateral cortical plate with 4 proximal interlocking screws. Distal interlocking screws not well visualized due to the  existing knee replacement. IMPRESSION: Status post ORIF for comminuted distal femur fracture. No evidence for immediate hardware complications. Electronically Signed   By: Misty Stanley M.D.   On: 05/15/2017 09:32   Dg Femur, Min 2 Views Right  Result Date: 05/14/2017 CLINICAL DATA:  Pain after fall EXAM: RIGHT FEMUR 2 VIEWS COMPARISON:  None. FINDINGS: Comminuted displaced fracture of the distal femur as described on the knee x-ray. IMPRESSION: Comminuted displaced fracture of the distal femur as described on the knee x-ray. The proximal femur is intact. Electronically Signed   By: Dorise Bullion III M.D   On: 05/14/2017 12:32    Lab Data:  CBC:  Recent Labs Lab 05/14/17 1146 05/15/17 0406  WBC 9.3 8.0  NEUTROABS 7.5  --   HGB 12.3* 10.5*  HCT 36.8* 32.5*  MCV 95.1 96.2  PLT 153 299   Basic Metabolic Panel:  Recent Labs Lab 05/14/17 1146 05/15/17 0406  NA 138 132*  K 3.5 3.5  CL 103 98*  CO2 26 26  GLUCOSE 139* 118*  BUN 11 14  CREATININE 0.84 0.92  CALCIUM 8.7* 8.5*   GFR: Estimated Creatinine Clearance: 65.8 mL/min (by C-G formula based on SCr of 0.92 mg/dL). Liver Function Tests: No results for input(s): AST, ALT, ALKPHOS, BILITOT, PROT, ALBUMIN in the last 168 hours. No results for input(s): LIPASE, AMYLASE in the last 168 hours. No results for input(s): AMMONIA in the last 168 hours. Coagulation Profile:  Recent Labs Lab 05/14/17 1146  INR 1.21   Cardiac Enzymes: No results for input(s): CKTOTAL, CKMB, CKMBINDEX, TROPONINI in the last 168 hours. BNP (last 3 results) No results for input(s): PROBNP in the last 8760 hours. HbA1C: No results for input(s): HGBA1C in the last 72 hours. CBG: No results for input(s): GLUCAP in the last 168 hours. Lipid Profile: No results for input(s): CHOL, HDL, LDLCALC, TRIG, CHOLHDL, LDLDIRECT in the last 72 hours. Thyroid Function Tests: No results for input(s): TSH, T4TOTAL, FREET4, T3FREE, THYROIDAB in the last 72  hours. Anemia Panel: No results  for input(s): VITAMINB12, FOLATE, FERRITIN, TIBC, IRON, RETICCTPCT in the last 72 hours. Urine analysis:    Component Value Date/Time   COLORURINE YELLOW 02/15/2015 1513   APPEARANCEUR CLEAR 02/15/2015 1513   LABSPEC 1.009 02/15/2015 1513   PHURINE 6.5 02/15/2015 1513   GLUCOSEU NEGATIVE 02/15/2015 1513   HGBUR NEGATIVE 02/15/2015 1513   BILIRUBINUR NEGATIVE 02/15/2015 1513   KETONESUR NEGATIVE 02/15/2015 1513   PROTEINUR NEGATIVE 02/15/2015 1513   UROBILINOGEN 0.2 02/15/2015 1513   NITRITE NEGATIVE 02/15/2015 1513   LEUKOCYTESUR NEGATIVE 02/15/2015 1513     Ripudeep Rai M.D. [facility] 05/15/2017, 1:01 PM  Pager: [phone] Between 7am to 7pm - call Pager - [phone]  After 7pm go to [email or web address] - password [email or web address]  Call night coverage person covering after 7pm

## 2017-05-15 NOTE — Anesthesia Procedure Notes (Signed)
Procedure Name: Intubation Date/Time: 05/15/2017 7:57 AM Performed by: Eligha Bridegroom Pre-anesthesia Checklist: Patient identified, Emergency Drugs available, Suction available, Patient being monitored and Timeout performed Patient Re-evaluated:Patient Re-evaluated prior to inductionOxygen Delivery Method: Circle system utilized Preoxygenation: Pre-oxygenation with 100% oxygen Intubation Type: IV induction Ventilation: Mask ventilation without difficulty and Oral airway inserted - appropriate to patient size Grade View: Grade III Tube type: Oral Number of attempts: 2 Airway Equipment and Method: Stylet Placement Confirmation: ETT inserted through vocal cords under direct vision and breath sounds checked- equal and bilateral Secured at: 22 cm Tube secured with: Tape Dental Injury: Teeth and Oropharynx as per pre-operative assessment

## 2017-05-16 ENCOUNTER — Encounter (HOSPITAL_COMMUNITY): Payer: Self-pay | Admitting: Orthopedic Surgery

## 2017-05-16 LAB — BASIC METABOLIC PANEL
Anion gap: 5 (ref 5–15)
BUN: 11 mg/dL (ref 6–20)
CHLORIDE: 101 mmol/L (ref 101–111)
CO2: 28 mmol/L (ref 22–32)
CREATININE: 1.06 mg/dL (ref 0.61–1.24)
Calcium: 8.1 mg/dL — ABNORMAL LOW (ref 8.9–10.3)
Glucose, Bld: 136 mg/dL — ABNORMAL HIGH (ref 65–99)
Potassium: 3.7 mmol/L (ref 3.5–5.1)
SODIUM: 134 mmol/L — AB (ref 135–145)

## 2017-05-16 LAB — CBC
HCT: 28.9 % — ABNORMAL LOW (ref 39.0–52.0)
HEMOGLOBIN: 9.4 g/dL — AB (ref 13.0–17.0)
MCH: 31.8 pg (ref 26.0–34.0)
MCHC: 32.5 g/dL (ref 30.0–36.0)
MCV: 97.6 fL (ref 78.0–100.0)
PLATELETS: 138 10*3/uL — AB (ref 150–400)
RBC: 2.96 MIL/uL — AB (ref 4.22–5.81)
RDW: 14.2 % (ref 11.5–15.5)
WBC: 8.7 10*3/uL (ref 4.0–10.5)

## 2017-05-16 NOTE — Progress Notes (Signed)
Patient ID: Craig Chapman, male   DOB: 1928-03-17, 83 y.o.   MRN: [id number] Postoperative day 1 status post open reduction internal fixation right supracondylar femur fracture. The wound VAC is clean and dry patient is sitting up in a chair without complaints. Plan for nonweightbearing on the right lower extremity for 4 weeks plan for discharge to skilled nursing.

## 2017-05-16 NOTE — Progress Notes (Signed)
[facility]                                                                              Patient Demographics  Phillipe Clemon, is a 83 y.o. male, DOB - 1928-04-27, XTK:[id number]  Admit date - 05/14/2017   Admitting Physician Elwin Mocha, MD  Outpatient Primary MD for the patient is Patient, No Pcp Per  Outpatient specialists:   LOS - 2  days   Medical records reviewed and are as summarized below:    Chief Complaint  Patient presents with  . Knee Injury       Brief summary   Patient is a 83 year old male with paroxysmal atrial fibrillation on no anticoagulation, hypertension, history of TIA, presented with mechanical fall. Patient was walking down the stairs when he missed the last step and fell onto his right knee. X-ray revealed a comminuted supracondylar femur fracture on the right.   Assessment & Plan    Principal Problem:   Closed comminuted supracondylar fracture of right femur ([place]) - Presented after a mechanical fall and missing a step on the stairs. X-rays revealed a comminuted supracondylar femur fracture on the right - Patient underwent open reduction and internal fixation of distal femur fracture on 7/8, postop day #1  - Per orthopedics, nonweightbearing on the right lower extremity for 4 weeks, will need skilled nursing facility -Pain control, DVT prophylaxis per orthopedics  - H&H is stable, monitor closely  Active Problems:   PAF (paroxysmal atrial fibrillation) (HCC) - Currently in sinus rhythm, not on any antiarrhythmic medications - Not on anticoagulation    Hypertension - Stable    History of lung or bronchial cancer - History of small cell lung CA, currently stable   Code Status: Full CODE STATUS DVT Prophylaxis:  Will await orthopedic recommendations regarding DVT prophylaxis Family Communication: Discussed in detail with the patient, all imaging results, lab results explained to the patient  and wife  Disposition Plan:  Will need skilled nursing facility  Time Spent in minutes  25 minutes  Procedures:  ORIF  Consultants:   Orthopedics  Antimicrobials:      Medications  Scheduled Meds: . aspirin EC  325 mg Oral Q breakfast  . Chlorhexidine Gluconate Cloth  6 each Topical Daily  . mupirocin ointment  1 application Nasal BID   Continuous Infusions: . sodium chloride 10 mL/hr at 05/15/17 1045   PRN Meds:.acetaminophen **OR** acetaminophen, HYDROcodone-acetaminophen, menthol-cetylpyridinium **OR** phenol, metoCLOPramide **OR** metoCLOPramide (REGLAN) injection, morphine injection, ondansetron **OR** ondansetron (ZOFRAN) IV   Antibiotics   Anti-infectives    Start     Dose/Rate Route Frequency Ordered Stop   05/15/17 1400  ceFAZolin (ANCEF) IVPB 2g/100 mL premix     2 g 200 mL/hr over 30 Minutes Intravenous Every 6 hours 05/15/17 1037 05/15/17 2130   05/15/17 0600  ceFAZolin (ANCEF) IVPB 2g/100 mL premix     2 g 200 mL/hr over 30 Minutes Intravenous On call to O.R. 05/14/17 1715 05/15/17 0800        Subjective:   Tillman Sers was seen and examined today. Currently sitting up in the chair, no complaints.  Patient  denies dizziness, chest pain, shortness of breath, abdominal pain, N/V/D/C, new weakness, numbess, tingling. No acute events overnight.    Objective:   Vitals:   05/15/17 1300 05/15/17 2029 05/16/17 0033 05/16/17 0612  BP: 135/68 132/66 (!) 122/54 (!) 104/40  Pulse: 67 86 81 73  Resp: 15     Temp: 97.6 F (36.4 C) (!) 100.8 F (38.2 C) 99.7 F (37.6 C) 98.4 F (36.9 C)  TempSrc: Oral Oral Oral Oral  SpO2: 91% 99% 100% 100%  Weight:      Height:        Intake/Output Summary (Last 24 hours) at 05/16/17 1257 Last data filed at 05/16/17 1000  Gross per 24 hour  Intake          1270.67 ml  Output             1100 ml  Net           170.67 ml     Wt Readings from Last 3 Encounters:  05/14/17 97.5 kg (215 lb)     Exam Physical Exam General: Alert and  oriented x 3, NAD Eyes:  HEENT:   Cardiovascular: S1 S2 auscultated, no rubs, murmurs or gallops. Regular rate and rhythm. No pedal edema b/l Respiratory: Clear to auscultation bilaterally, no wheezing, rales or rhonchi Gastrointestinal: Soft, nontender, nondistended, + bowel sounds Ext: no pedal edema bilaterally Neuro: no new deficits Musculoskeletal: No digital cyanosis, clubbing Skin: No rashes Psych: Normal affect and demeanor, alert and oriented x3      Data Reviewed:  I have personally reviewed following labs and imaging studies  Micro Results Recent Results (from the past 240 hour(s))  Surgical PCR screen     Status: Abnormal   Collection Time: 05/14/17  5:53 PM  Result Value Ref Range Status   MRSA, PCR NEGATIVE NEGATIVE Final   Staphylococcus aureus POSITIVE (A) NEGATIVE Final    Comment:        The Xpert SA Assay (FDA approved for NASAL specimens in patients over 27 years of age), is one component of a comprehensive surveillance program.  Test performance has been validated by [facility] for patients greater than or equal to 71 year old. It is not intended to diagnose infection nor to guide or monitor treatment.     Radiology Reports Dg Chest 1 View  Result Date: 05/14/2017 CLINICAL DATA:  Leg pain after fall. EXAM: CHEST 1 VIEW COMPARISON:  June 06, 2009 FINDINGS: The patient is status post left pneumonectomy with complete opacification of the left hemithorax. No focal infiltrate identified. No acute interval changes. IMPRESSION: No acute abnormality. Electronically Signed   By: Dorise Bullion III M.D   On: 05/14/2017 12:34   Dg Knee Complete 4 Views Right  Result Date: 05/14/2017 CLINICAL DATA:  Pain after fall. EXAM: RIGHT KNEE - COMPLETE 4+ VIEW COMPARISON:  None. FINDINGS: The patient is status post knee replacement. The proximal fibula and tibia are intact. There is a comminuted displaced fracture of the distal femur. The fracture line extends nearly to  the level of the knee replacement hardware in the distal femur. 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 IMPRESSION: Comminuted displaced fracture of the distal femur extending nearly to the level of the knee replacement hardware. Lipoma hemarthrosis. Electronically Signed   By: Dorise Bullion III M.D   On: 05/14/2017 12:31   Dg Knee Right Port  Result Date: 05/14/2017 CLINICAL DATA:  Distal femur fracture.  Patient in leg  brace. EXAM: PORTABLE RIGHT KNEE - 1-2 VIEW COMPARISON:  Knee radiograph 05/14/2017 FINDINGS: There is a comminuted displaced distal femur fracture. There is posterior displacement of the distal fracture fragments. Overlying soft tissue swelling. Patient status post knee arthroplasty. Note is made of a lipohemarthrosis. IMPRESSION: Comminuted displaced distal femur fracture. Electronically Signed   By: Lovey Newcomer M.D.   On: 05/14/2017 16:28   Dg C-arm 61-120 Min  Result Date: 05/15/2017 CLINICAL DATA:  Distal femur fracture.  ORIF. EXAM: RIGHT FEMUR 2 VIEWS; DG C-ARM 61-120 MIN COMPARISON:  05/14/2017. FINDINGS: Four intraoperative spot fluoro film show placement of a lateral cortical plate with 4 proximal interlocking screws. Distal interlocking screws not well visualized due to the existing knee replacement. IMPRESSION: Status post ORIF for comminuted distal femur fracture. No evidence for immediate hardware complications. Electronically Signed   By: Misty Stanley M.D.   On: 05/15/2017 09:32   Dg Femur, Min 2 Views Right  Result Date: 05/15/2017 CLINICAL DATA:  Distal femur fracture.  ORIF. EXAM: RIGHT FEMUR 2 VIEWS; DG C-ARM 61-120 MIN COMPARISON:  05/14/2017. FINDINGS: Four intraoperative spot fluoro film show placement of a lateral cortical plate with 4 proximal interlocking screws. Distal interlocking screws not well visualized due to the existing knee replacement. IMPRESSION: Status post ORIF for comminuted distal femur fracture. No evidence for  immediate hardware complications. Electronically Signed   By: Misty Stanley M.D.   On: 05/15/2017 09:32   Dg Femur, Min 2 Views Right  Result Date: 05/14/2017 CLINICAL DATA:  Pain after fall EXAM: RIGHT FEMUR 2 VIEWS COMPARISON:  None. FINDINGS: Comminuted displaced fracture of the distal femur as described on the knee x-ray. IMPRESSION: Comminuted displaced fracture of the distal femur as described on the knee x-ray. The proximal femur is intact. Electronically Signed   By: Dorise Bullion III M.D   On: 05/14/2017 12:32    Lab Data:  CBC:  Recent Labs Lab 05/14/17 1146 05/15/17 0406 05/16/17 0635  WBC 9.3 8.0 8.7  NEUTROABS 7.5  --   --   HGB 12.3* 10.5* 9.4*  HCT 36.8* 32.5* 28.9*  MCV 95.1 96.2 97.6  PLT 153 157 812*   Basic Metabolic Panel:  Recent Labs Lab 05/14/17 1146 05/15/17 0406 05/16/17 0635  NA 138 132* 134*  K 3.5 3.5 3.7  CL 103 98* 101  CO2 26 26 28   GLUCOSE 139* 118* 136*  BUN 11 14 11   CREATININE 0.84 0.92 1.06  CALCIUM 8.7* 8.5* 8.1*   GFR: Estimated Creatinine Clearance: 57.1 mL/min (by C-G formula based on SCr of 1.06 mg/dL). Liver Function Tests: No results for input(s): AST, ALT, ALKPHOS, BILITOT, PROT, ALBUMIN in the last 168 hours. No results for input(s): LIPASE, AMYLASE in the last 168 hours. No results for input(s): AMMONIA in the last 168 hours. Coagulation Profile:  Recent Labs Lab 05/14/17 1146  INR 1.21   Cardiac Enzymes: No results for input(s): CKTOTAL, CKMB, CKMBINDEX, TROPONINI in the last 168 hours. BNP (last 3 results) No results for input(s): PROBNP in the last 8760 hours. HbA1C: No results for input(s): HGBA1C in the last 72 hours. CBG: No results for input(s): GLUCAP in the last 168 hours. Lipid Profile: No results for input(s): CHOL, HDL, LDLCALC, TRIG, CHOLHDL, LDLDIRECT in the last 72 hours. Thyroid Function Tests: No results for input(s): TSH, T4TOTAL, FREET4, T3FREE, THYROIDAB in the last 72 hours. Anemia  Panel: No results for input(s): VITAMINB12, FOLATE, FERRITIN, TIBC, IRON, RETICCTPCT in the last 72 hours. Urine  analysis:    Component Value Date/Time   COLORURINE YELLOW 02/15/2015 1513   APPEARANCEUR CLEAR 02/15/2015 1513   LABSPEC 1.009 02/15/2015 1513   PHURINE 6.5 02/15/2015 1513   GLUCOSEU NEGATIVE 02/15/2015 1513   HGBUR NEGATIVE 02/15/2015 1513   BILIRUBINUR NEGATIVE 02/15/2015 1513   KETONESUR NEGATIVE 02/15/2015 1513   PROTEINUR NEGATIVE 02/15/2015 1513   UROBILINOGEN 0.2 02/15/2015 1513   NITRITE NEGATIVE 02/15/2015 1513   LEUKOCYTESUR NEGATIVE 02/15/2015 1513     Arnetia Bronk M.D. [facility] 05/16/2017, 12:57 PM  Pager: [phone] Between 7am to 7pm - call Pager - [phone]  After 7pm go to [email or web address] - password [email or web address]  Call night coverage person covering after 7pm

## 2017-05-16 NOTE — NC FL2 (Signed)
[place] LEVEL OF CARE SCREENING TOOL     IDENTIFICATION  Patient Name: Craig Chapman Birthdate: 1928/06/20 Sex: male Admission Date (Current Location): 05/14/2017  [facility] and [place] Number:  Herbalist and Address:  [facility] [place], [place] [address] [place], [place] [address]      Provider Number: [id number]  Attending Physician Name and Address:  Mendel Corning, MD  Relative Name and Phone Number:       Current Level of Care: Hospital Recommended Level of Care: [place] Prior Approval Number:    Date Approved/Denied: 05/16/17 PASRR Number: [id number]  Discharge Plan: Home    Current Diagnoses: Patient Active Problem List   Diagnosis Date Noted  . Closed comminuted supracondylar fracture of right femur ([place]) 05/14/2017  . PAF (paroxysmal atrial fibrillation) ([place]) 05/14/2017  . Hypertension 05/14/2017  . History of lung or bronchial cancer 05/14/2017  . Closed comminuted supracondylar fracture of femur, right, initial encounter ([place])   . Fall     Orientation RESPIRATION BLADDER Height & Weight     Time, Situation, Place, Self  Normal Continent Weight: 215 lb (97.5 kg) Height:  6\' 2"  (188 cm)  BEHAVIORAL SYMPTOMS/MOOD NEUROLOGICAL BOWEL NUTRITION STATUS      Continent Diet (See DC Summary)  AMBULATORY STATUS COMMUNICATION OF NEEDS Skin   Extensive Assist Verbally Surgical wounds, Wound Vac (Right Leg Closed Incision)                       Personal Care Assistance Level of Assistance  Bathing, Feeding, Dressing Bathing Assistance: Maximum assistance Feeding assistance: Independent Dressing Assistance: Maximum assistance     Functional Limitations Info             SPECIAL CARE FACTORS FREQUENCY  PT (By licensed PT), OT (By licensed OT)     PT Frequency: 3xweek OT Frequency: 2xweek            Contractures      Additional Factors Info  Code Status, Allergies Code Status  Info: Full Code Allergies Info: SULFA ANTIBIOTICS      Isolation Precautions Info: N/A     Current Medications (05/16/2017):  This is the current hospital active medication list Current Facility-Administered Medications  Medication Dose Route Frequency Provider Last Rate Last Dose  . 0.9 %  sodium chloride infusion   Intravenous Continuous Newt Minion, MD 10 mL/hr at 05/15/17 1045    . acetaminophen (TYLENOL) tablet 650 mg  650 mg Oral Q6H PRN Newt Minion, MD       Or  . acetaminophen (TYLENOL) suppository 650 mg  650 mg Rectal Q6H PRN Newt Minion, MD      . aspirin EC tablet 325 mg  325 mg Oral Q breakfast Newt Minion, MD   325 mg at 05/16/17 0800  . Chlorhexidine Gluconate Cloth 2 % PADS 6 each  6 each Topical Daily Elwin Mocha, MD   6 each at 05/16/17 1000  . HYDROcodone-acetaminophen (NORCO/VICODIN) 5-325 MG per tablet 1-2 tablet  1-2 tablet Oral Q6H PRN Newt Minion, MD   2 tablet at 05/16/17 0514  . menthol-cetylpyridinium (CEPACOL) lozenge 3 mg  1 lozenge Oral PRN Newt Minion, MD       Or  . phenol (CHLORASEPTIC) mouth spray 1 spray  1 spray Mouth/Throat PRN Newt Minion, MD      . metoCLOPramide (REGLAN) tablet 5-10 mg  5-10  mg Oral Q8H PRN Newt Minion, MD       Or  . metoCLOPramide (REGLAN) injection 5-10 mg  5-10 mg Intravenous Q8H PRN Newt Minion, MD      . morphine 4 MG/ML injection 0.52 mg  0.52 mg Intravenous Q2H PRN Newt Minion, MD      . mupirocin ointment (BACTROBAN) 2 % 1 application  1 application Nasal BID Elwin Mocha, MD   1 application at 15/86/82 1000  . ondansetron (ZOFRAN) tablet 4 mg  4 mg Oral Q6H PRN Newt Minion, MD       Or  . ondansetron [facility]) injection 4 mg  4 mg Intravenous Q6H PRN Newt Minion, MD         Discharge Medications: Please see discharge summary for a list of discharge medications.  Relevant Imaging Results:  Relevant Lab Results:   Additional Information SS: [place], LCSW

## 2017-05-16 NOTE — Evaluation (Signed)
Occupational Therapy Evaluation Patient Details Name: Craig Chapman MRN: [id number] DOB: [birth year] Today's Date: 05/16/2017    History of Present Illness 83 y.o. male with mechanical fall after missing last step on stairs at home. Underwent ORIF for R distal femur fx on 05/15/17. PMH significant for paroxysmal atrial fibrillation on no medications or anticoagulation, hypertension on no medications, history of TIA, history of arthritis and prior knee surgeries.     Clinical Impression   PTA, pt was living with his wife and was independent. Currently, pt requires set up for ADLs in sitting, Max A +2 for LB ADLs, and Max A +2 for functional transfers. Pt with difficulty maintaining WB status during LB ADLs and transfers. Pt would benefit from further acute OT to increase safety, adherence to WB status, and independence during ADLs and functional mobility. Recommend dc SNF for further OT to increase pt safety and independence with ADLs and functional mobility.    Follow Up Recommendations  SNF;Supervision/Assistance - 24 hour    Equipment Recommendations  Other (comment) (Defer to next venue)    Recommendations for Other Services PT consult     Precautions / Restrictions Restrictions Weight Bearing Restrictions: Yes RLE Weight Bearing: Non weight bearing      Mobility Bed Mobility               General bed mobility comments: Pt in recliner upon arrival finishing breakfast and preparing for sponge bath with spouse  Transfers Overall transfer level: Needs assistance Equipment used: Rolling walker (2 wheeled) Transfers: Sit to/from Stand Sit to Stand: Max assist;+2 physical assistance         General transfer comment: Pt requiring Max A +2 for sit<>stand and additional help to manage RLE and maintain WB precautions. Pt unable to maintain WB precations at this time    Balance Overall balance assessment: Needs assistance Sitting-balance support: No upper extremity  supported;Feet supported Sitting balance-Leahy Scale: Fair Sitting balance - Comments: Able to lean down for LB bathing   Standing balance support: Bilateral upper extremity supported;During functional activity Standing balance-Leahy Scale: Zero Standing balance comment: Reliant on physical A for standing                           ADL either performed or assessed with clinical judgement   ADL Overall ADL's : Needs assistance/impaired Eating/Feeding: Set up;Sitting   Grooming: Wash/dry face;Set up;Sitting   Upper Body Bathing: Minimal assistance;With caregiver independent assisting;Sitting   Lower Body Bathing: Maximal assistance;+2 for physical assistance;Sit to/from stand Lower Body Bathing Details (indicate cue type and reason): Pt able to complete peri care and washing BLE in seated. Requires Max A +2 to stand and clean backside. Pt wife performed bathing as needed Upper Body Dressing : Set up;Sitting Upper Body Dressing Details (indicate cue type and reason): Donned new gown Lower Body Dressing: Maximal assistance;Sit to/from stand;+2 for physical assistance Lower Body Dressing Details (indicate cue type and reason): Pt requires Max A to don new socks. Pt required Min A to bring underwear over feet then Max A +2 for sit<>stand when pulling underwear over hips.       Toileting - Clothing Manipulation Details (indicate cue type and reason): Complete peri care after using urinal while seated     Functional mobility during ADLs: Maximal assistance;+2 for physical assistance;Rolling walker (Sit<>stand only; pt unable to maintian WB precautions) General ADL Comments: Pt in good spirits and willing to participate in therapy. Wife  present throughout session and very helpful. Pt requiring Max A +2 for LB ADLs and functional transfers. Pt would benefit from further OT at SNF     Vision Baseline Vision/History: Wears glasses Patient Visual Report: No change from baseline        Perception     Praxis      Pertinent Vitals/Pain Pain Assessment: 0-10 Pain Score: 5  Pain Location: R leg Pain Descriptors / Indicators: Constant;Discomfort;Grimacing;Guarding Pain Intervention(s): Monitored during session;Limited activity within patient's tolerance;Repositioned     Hand Dominance Right   Extremity/Trunk Assessment Upper Extremity Assessment Upper Extremity Assessment: Overall WFL for tasks assessed   Lower Extremity Assessment Lower Extremity Assessment: Defer to PT evaluation   Cervical / Trunk Assessment Cervical / Trunk Assessment: Normal   Communication Communication Communication: HOH (Hearing aides)   Cognition Arousal/Alertness: Awake/alert Behavior During Therapy: WFL for tasks assessed/performed Overall Cognitive Status: Within Functional Limits for tasks assessed                                     General Comments  Wife present throughout session    Exercises     Shoulder Instructions      Home Living Family/patient expects to be discharged to:: Skilled nursing facility Living Arrangements: Spouse/significant other [name]") Available Help at Discharge: Family;Available 24 hours/day (Children live on same land nearby) Type of Home: House       Home Layout: Two level;Able to live on main level with bedroom/bathroom                          Prior Functioning/Environment Level of Independence: Independent        Comments: ADls, IADLs        OT Problem List: Decreased strength;Decreased range of motion;Decreased activity tolerance;Impaired balance (sitting and/or standing);Decreased safety awareness;Decreased knowledge of use of DME or AE;Decreased knowledge of precautions;Pain      OT Treatment/Interventions: Self-care/ADL training;Therapeutic exercise;Energy conservation;DME and/or AE instruction;Therapeutic activities;Patient/family education    OT Goals(Current goals can be found in the care  plan section) Acute Rehab OT Goals Patient Stated Goal: Get better OT Goal Formulation: With patient Time For Goal Achievement: 05/30/17 Potential to Achieve Goals: Good ADL Goals Pt Will Perform Lower Body Bathing: with mod assist;sit to/from stand Pt Will Perform Lower Body Dressing: with mod assist;sit to/from stand Pt Will Transfer to Toilet: with mod assist;stand pivot transfer;bedside commode Pt Will Perform Toileting - Clothing Manipulation and hygiene: with mod assist;sit to/from stand  OT Frequency: Min 2X/week   Barriers to D/C:            Co-evaluation              AM-PAC PT "6 Clicks" Daily Activity     Outcome Measure Help from another person eating meals?: None Help from another person taking care of personal grooming?: A Little Help from another person toileting, which includes using toliet, bedpan, or urinal?: A Lot Help from another person bathing (including washing, rinsing, drying)?: A Lot Help from another person to put on and taking off regular upper body clothing?: A Little Help from another person to put on and taking off regular lower body clothing?: A Lot 6 Click Score: 16   End of Session Equipment Utilized During Treatment: Gait belt;Rolling walker Nurse Communication: Mobility status;Other (comment);Precautions;Weight bearing status (Pt unable to stand and maintain WB status)  Activity Tolerance: Patient tolerated treatment well Patient left: in chair;with call bell/phone within reach;with family/visitor present  OT Visit Diagnosis: Unsteadiness on feet (R26.81);Other abnormalities of gait and mobility (R26.89);Muscle weakness (generalized) (M62.81);History of falling (Z91.81);Pain Pain - Right/Left: Right Pain - part of body: Leg                Time: 9833-8250 OT Time Calculation (min): 33 min Charges:  OT General Charges $OT Visit: 1 Procedure OT Evaluation $OT Eval Low Complexity: 1 Procedure OT Treatments $Self Care/Home Management :  8-22 mins G-Codes:     [place], OTR/L Acute Rehab Pager: [phone] Office: [place] 05/16/2017, 9:15 AM

## 2017-05-16 NOTE — Progress Notes (Addendum)
Patient's last dose of IV Ancef infused without difficulty,   then he accidentally pulled out his IV access.  Pain level has been 0-3/10 prior to surgery and post operatively and takes Norco and/or Tylenol.  He refuses to take any IV Morphine and has requested to leave IV out.  Wife at bedside.  Talked at this time with Dr. Myna Hidalgo who states okay to leave IV out.  Order placed.

## 2017-05-16 NOTE — Evaluation (Signed)
Physical Therapy Evaluation Patient Details Name: Craig Chapman MRN: [id number] DOB: 07-22-1928 Today's Date: 05/16/2017   History of Present Illness  83 y.o. male with mechanical fall after missing last step on stairs at home. Underwent ORIF for R distal femur fx on 05/15/17. PMH significant for paroxysmal atrial fibrillation on no medications or anticoagulation, hypertension on no medications, history of TIA, history of arthritis and prior knee surgeries.   Clinical Impression  Pt pleasant with decreased mobility, gait, strength and function limited by pain who will benefit from acute therapy to maximize strength, function, activity tolerance and balance to decrease burden of care.     Follow Up Recommendations SNF;Supervision/Assistance - 24 hour    Equipment Recommendations  3in1 (PT)    Recommendations for Other Services OT consult     Precautions / Restrictions Precautions Precautions: Fall Precaution Comments: VAC, KI presnt but no orders- utilized for transfer to assist with maintaining NWB Restrictions RLE Weight Bearing: Non weight bearing      Mobility  Bed Mobility Overal bed mobility: Needs Assistance Bed Mobility: Sit to Supine       Sit to supine: Mod assist;+2 for safety/equipment   General bed mobility comments: assist to bring legs onto surface with cues for safety and guiding trunk. pt able to slide up in bed with use of rails  Transfers Overall transfer level: Needs assistance   Transfers: Sit to/from Stand;Stand Pivot Transfers Sit to Stand: Mod assist;+2 physical assistance Stand pivot transfers: Max assist;+2 physical assistance       General transfer comment: cues for hand placement, assist for anterior translation and rise from surface from recliner. Pt required his right foot on PT foot throughout to maintain NWB status. Assist and cues to move RW and scoot LLe to pivot to bed with decreased control of descent  Ambulation/Gait              General Gait Details: unable  Stairs            Wheelchair Mobility    Modified Rankin (Stroke Patients Only)       Balance Overall balance assessment: Needs assistance   Sitting balance-Leahy Scale: Fair       Standing balance-Leahy Scale: Poor                               Pertinent Vitals/Pain Pain Assessment: 0-10 Pain Score: 6  Pain Location: R leg Pain Descriptors / Indicators: Aching;Sore Pain Intervention(s): Limited activity within patient's tolerance;Repositioned    Home Living Family/patient expects to be discharged to:: Skilled nursing facility Living Arrangements: Spouse/significant other Available Help at Discharge: Family;Available 24 hours/day Type of Home: House Home Access: Stairs to enter   [employer] of Steps: 2 Home Layout: Two level;Able to live on main level with bedroom/bathroom Home Equipment: Gilford Rile - 2 wheels;Cane - single point      Prior Function Level of Independence: Independent               Hand Dominance        Extremity/Trunk Assessment   Upper Extremity Assessment Upper Extremity Assessment: Defer to OT evaluation    Lower Extremity Assessment Lower Extremity Assessment: Generalized weakness    Cervical / Trunk Assessment Cervical / Trunk Assessment: Normal  Communication   Communication: HOH  Cognition Arousal/Alertness: Awake/alert Behavior During Therapy: WFL for tasks assessed/performed Overall Cognitive Status: Impaired/Different from baseline Area of Impairment: Safety/judgement  Safety/Judgement: Decreased awareness of deficits     General Comments: pt having difficulty processing mobility without use of RLE      General Comments      Exercises     Assessment/Plan    PT Assessment Patient needs continued PT services  PT Problem List Decreased strength;Decreased mobility;Decreased safety awareness;Decreased range of  motion;Decreased activity tolerance;Decreased cognition;Decreased balance;Decreased knowledge of use of DME;Pain;Decreased knowledge of precautions       PT Treatment Interventions Gait training;Therapeutic exercise;DME instruction;Therapeutic activities;Patient/family education;Stair training;Functional mobility training    PT Goals (Current goals can be found in the Care Plan section)  Acute Rehab PT Goals Patient Stated Goal: be able to walk PT Goal Formulation: With patient/family Time For Goal Achievement: 05/30/17 Potential to Achieve Goals: Fair    Frequency Min 3X/week   Barriers to discharge Decreased caregiver support      Co-evaluation               AM-PAC PT "6 Clicks" Daily Activity  Outcome Measure Difficulty turning over in bed (including adjusting bedclothes, sheets and blankets)?: A Lot Difficulty moving from lying on back to sitting on the side of the bed? : Total Difficulty sitting down on and standing up from a chair with arms (e.g., wheelchair, bedside commode, etc,.)?: Total Help needed moving to and from a bed to chair (including a wheelchair)?: Total Help needed walking in hospital room?: Total Help needed climbing 3-5 steps with a railing? : Total 6 Click Score: 7    End of Session Equipment Utilized During Treatment: Gait belt Activity Tolerance: Patient tolerated treatment well Patient left: in bed;with call bell/phone within reach;with family/visitor present;with bed alarm set Nurse Communication: Mobility status;Precautions;Weight bearing status PT Visit Diagnosis: Other abnormalities of gait and mobility (R26.89);Unsteadiness on feet (R26.81);Muscle weakness (generalized) (M62.81);Pain Pain - Right/Left: Right Pain - part of body: Leg    Time: 0539-7673 PT Time Calculation (min) (ACUTE ONLY): 18 min   Charges:   PT Evaluation $PT Eval Moderate Complexity: 1 Procedure     PT G Codes:        Elwyn Reach,  PT [phone]   [place] Jenavieve Freda 05/16/2017, 1:12 PM

## 2017-05-16 NOTE — Clinical Social Work Note (Signed)
Clinical Social Work Assessment  Patient Details  Name: Craig Chapman MRN: [id number] Date of Birth: June 15, 1928  Date of referral:  05/16/17               Reason for consult:  Facility Placement                Permission sought to share information with:    Permission granted to share information::  Yes, Verbal Permission Granted  Name::     Craig Chapman  Agency::  SNF- [facility]  Relationship::  spouse  Contact Information:     Housing/Transportation Living arrangements for the past 2 months:  [place] of Information:  Patient, Spouse Patient Interpreter Needed:  None Criminal Activity/Legal Involvement Pertinent to Current Situation/Hospitalization:  No - Comment as needed Significant Relationships:  Spouse Lives with:  Spouse Do you feel safe going back to the place where you live?  No Need for family participation in patient care:  Yes (Comment)  Care giving concerns:  Patient resided with spouse prior to hospitalization.  Pt unsafe to return at home as his spouse cannot care for him. Agreeable to SNF at DC.  Social Worker assessment / plan:  CSW introduced self and discussed the clinical teams recommendation at DC. CSW discussed SNF options and placement. Patient and Spouse would like to go to [facility] in [facility]. CSW obtained permission to send out to other SNF's in area as well.  FL2 complete. Passr confirmed.Offers sent.  Employment status:  Retired Forensic scientist:  Medicare, Other (Comment Required) (secondary-BCBS) PT Recommendations:  [place] / Referral to community resources:  [place]  Patient/Family's Response to care:  Psychologist, prison and probation services of CSW assistance with SNF process. No issues or concerns at this time.  Patient/Family's Understanding of and Emotional Response to Diagnosis, Current Treatment, and Prognosis:  Patient/family has good understanding of  diagnosis, current treatment and prognosis. They are hopeful that short term rehab will address impairment. No issues or concerns reported at this time.  Emotional Assessment Appearance:  Appears stated age Attitude/Demeanor/Rapport:   (Cooperative) Affect (typically observed):  Accepting, Appropriate Orientation:  Oriented to Self, Oriented to Place, Oriented to  Time, Oriented to Situation Alcohol / Substance use:  Not Applicable Psych involvement (Current and /or in the community):  No (Comment)  Discharge Needs  Concerns to be addressed:  Care Coordination Readmission within the last 30 days:  No Current discharge risk:  Dependent with Mobility, Physical Impairment Barriers to Discharge:  No Barriers Identified   Normajean Baxter, LCSW 05/16/2017, 12:37 PM

## 2017-05-17 LAB — BASIC METABOLIC PANEL
Anion gap: 6 (ref 5–15)
BUN: 12 mg/dL (ref 6–20)
CALCIUM: 8.1 mg/dL — AB (ref 8.9–10.3)
CO2: 28 mmol/L (ref 22–32)
CREATININE: 0.93 mg/dL (ref 0.61–1.24)
Chloride: 102 mmol/L (ref 101–111)
GFR calc non Af Amer: >60 mL/min (ref >60)
Glucose, Bld: 119 mg/dL — ABNORMAL HIGH (ref 65–99)
Potassium: 3.6 mmol/L (ref 3.5–5.1)
Sodium: 136 mmol/L (ref 135–145)

## 2017-05-17 LAB — CBC
HEMATOCRIT: 26.2 % — AB (ref 39.0–52.0)
HEMOGLOBIN: 8.4 g/dL — AB (ref 13.0–17.0)
MCH: 30.9 pg (ref 26.0–34.0)
MCHC: 32.1 g/dL (ref 30.0–36.0)
MCV: 96.3 fL (ref 78.0–100.0)
Platelets: 128 10*3/uL — ABNORMAL LOW (ref 150–400)
RBC: 2.72 MIL/uL — ABNORMAL LOW (ref 4.22–5.81)
RDW: 14 % (ref 11.5–15.5)
WBC: 8.9 10*3/uL (ref 4.0–10.5)

## 2017-05-17 MED ORDER — DOCUSATE SODIUM 100 MG PO CAPS
100.0000 mg | ORAL_CAPSULE | Freq: Two times a day (BID) | ORAL | Status: DC
  Administered 2017-05-17: 100 mg via ORAL
  Filled 2017-05-17: qty 1

## 2017-05-17 MED ORDER — DOCUSATE SODIUM 100 MG PO CAPS: 100.0000 mg | ORAL_CAPSULE | Freq: Two times a day (BID) | ORAL | 0 refills | Status: DC

## 2017-05-17 MED ORDER — MAGNESIUM CITRATE PO SOLN
1.0000 | Freq: Once | ORAL | Status: AC
  Administered 2017-05-17: 1 via ORAL
  Filled 2017-05-17: qty 296

## 2017-05-17 MED ORDER — BISACODYL 10 MG RE SUPP: 10.0000 mg | Freq: Every day | RECTAL | Status: DC | PRN

## 2017-05-17 MED ORDER — POLYETHYLENE GLYCOL 3350 17 G PO PACK
17.0000 g | PACK | Freq: Every day | ORAL | Status: DC
  Administered 2017-05-17: 17 g via ORAL
  Filled 2017-05-17: qty 1

## 2017-05-17 NOTE — Progress Notes (Signed)
Patient ID: Craig Chapman, male   DOB: [birth year], 83 y.o.   MRN: [id number] Postoperative day 2. Wound VAC clean and dry. Patient without complaints this morning. Patient may be discharged to skilled nursing at this time I will write orders to discontinue wound VAC and start dry dressing. I will follow-up in 1 week. Nonweightbearing right lower extremity

## 2017-05-17 NOTE — Clinical Social Work Placement (Signed)
   CLINICAL SOCIAL WORK PLACEMENT  NOTE  Date:  05/17/2017  Patient Details  Name: Craig Chapman MRN: [id number] Date of Birth: 27-Jan-1928  Clinical Social Work is seeking post-discharge placement for this patient at the [place] level of care (*CSW will initial, date and re-position this form in  chart as items are completed):  Yes   Patient/family provided with [place] Work Department's list of facilities offering this level of care within the geographic area requested by the patient (or if unable, by the patient's family).  Yes   Patient/family informed of their freedom to choose among providers that offer the needed level of care, that participate in Medicare, Medicaid or managed care program needed by the patient, have an available bed and are willing to accept the patient.  Yes   Patient/family informed of [place]'s ownership interest in [facility] and [facility], as well as of the fact that they are under no obligation to receive care at these facilities.  PASRR submitted to EDS on       PASRR number received on 05/16/17     Existing PASRR number confirmed on 05/16/17     FL2 transmitted to all facilities in geographic area requested by pt/family on 05/16/17     FL2 transmitted to all facilities within larger geographic area on 05/16/17     Patient informed that his/her managed care company has contracts with or will negotiate with certain facilities, including the following:        Yes   Patient/family informed of bed offers received.  Patient chooses bed at [facility], [place]     Physician recommends and patient chooses bed at      Patient to be transferred to [facility], [place] on 05/17/17.  Patient to be transferred to facility by [facility]AR     Patient family notified on 05/17/17 of transfer.  Name of family member notified:  Ms. Shelda Jakes at bedside     PHYSICIAN Please prepare priority discharge summary,  including medications, Please prepare prescriptions, Please sign FL2     Additional Comment:    _______________________________________________ Normajean Baxter, LCSW 05/17/2017, 10:53 AM

## 2017-05-17 NOTE — Clinical Social Work Placement (Signed)
   CLINICAL SOCIAL WORK PLACEMENT  NOTE  Date:  05/17/2017  Patient Details  Name: Craig Chapman MRN: [id number] Date of Birth: 01-Apr-1928  Clinical Social Work is seeking post-discharge placement for this patient at the [place] level of care (*CSW will initial, date and re-position this form in  chart as items are completed):  Yes   Patient/family provided with [place] Work Department's list of facilities offering this level of care within the geographic area requested by the patient (or if unable, by the patient's family).  Yes   Patient/family informed of their freedom to choose among providers that offer the needed level of care, that participate in Medicare, Medicaid or managed care program needed by the patient, have an available bed and are willing to accept the patient.  Yes   Patient/family informed of [facility]'s ownership interest in [facility] and [facility], as well as of the fact that they are under no obligation to receive care at these facilities.  PASRR submitted to EDS on       PASRR number received on 05/16/17     Existing PASRR number confirmed on 05/16/17     FL2 transmitted to all facilities in geographic area requested by pt/family on 05/16/17     FL2 transmitted to all facilities within larger geographic area on 05/16/17     Patient informed that his/her managed care company has contracts with or will negotiate with certain facilities, including the following:        Yes   Patient/family informed of bed offers received.  Patient chooses bed at [facility] [place]     Physician recommends and patient chooses bed at      Patient to be transferred to [place], [place] on 05/17/17.  Patient to be transferred to facility by [facility]     Patient family notified on 05/17/17 of transfer.  Name of family member notified:  Ms. Shelda Jakes at bedside     PHYSICIAN Please prepare priority discharge summary,  including medications, Please prepare prescriptions, Please sign FL2     Additional Comment:    _______________________________________________ Normajean Baxter, LCSW 05/17/2017, 9:36 AM

## 2017-05-17 NOTE — Social Work (Signed)
Clinical Social Worker facilitated patient discharge including contacting patient family and facility to confirm patient discharge plans.  Clinical information faxed to facility and family agreeable with plan.    CSW arranged ambulance transport via [facility] to [place].    RN to call [phone] to give report prior to discharge. Pt will go to Rm [id number].  Clinical Social Worker will sign off for now as social work intervention is no longer needed. Please consult [place] again if new need arises.  Elissa Hefty, LCSW Clinical Social Worker [phone]

## 2017-05-17 NOTE — Discharge Summary (Signed)
Physician Discharge Summary   Patient ID: TAKODA JANOWIAK MRN: [id number] DOB/AGE: [birth year] 83 y.o.  Admit date: 05/14/2017 Discharge date: 05/17/2017  Primary Care Physician:  Patient, No Pcp Per  Discharge Diagnoses:    . Closed comminuted supracondylar fracture of right femur ([place]) . PAF (paroxysmal atrial fibrillation) ([place]) . Hypertension   History of small cell lung cancer   Consults: Orthopedics, Dr. Sharol Given  Recommendations for Outpatient Follow-up:  1. Please repeat CBC/BMET at next visit 2. Per orthopedics, start dry dressing, follow-up in one week with Dr. Sharol Given 3. Nonweightbearing right lower extremity for 4 weeks   DIET: Heart healthy diet    Allergies:   Allergies  Allergen Reactions  . Sulfa Antibiotics Hives     DISCHARGE MEDICATIONS: Current Discharge Medication List    START taking these medications   Details  acetaminophen (TYLENOL) 500 MG tablet Take 1 tablet (500 mg total) by mouth every 6 (six) hours as needed for mild pain. Qty: 30 tablet, Refills: 0    aspirin EC 81 MG tablet Take 1 tablet (81 mg total) by mouth daily. Qty: 30 tablet, Refills: 1    docusate sodium (COLACE) 100 MG capsule Take 1 capsule (100 mg total) by mouth 2 (two) times daily. Qty: 10 capsule, Refills: 0      CONTINUE these medications which have NOT CHANGED   Details  ibuprofen (ADVIL,MOTRIN) 200 MG tablet Take 200 mg by mouth every 6 (six) hours as needed for headache or moderate pain.         Brief H and P: For complete details please refer to admission H and P, but in brief Patient is a 83 year old male with paroxysmal atrial fibrillation on no anticoagulation, hypertension, history of TIA, presented with mechanical fall. Patient was walking down the stairs when he missed the last step and fell onto his right knee. X-ray revealed a comminuted supracondylar femur fracture on the right.  Hospital Course:    Closed comminuted supracondylar fracture of right  femur ([place]) - Presented after a mechanical fall and missing a step on the stairs. X-rays revealed a comminuted supracondylar femur fracture on the right - Patient underwent open reduction and internal fixation of distal femur fracture on 7/8, postop day #2  - Per orthopedics, nonweightbearing on the right lower extremity for 4 weeks - Start dry dressing, continue aspirin for DVT prophylaxis per orthopedics -Follow up in one week with Dr. Sharol Given      PAF (paroxysmal atrial fibrillation) (HCC) - Currently in sinus rhythm, not on any antiarrhythmic medications - Not on anticoagulation    Hypertension - Stable    History of lung or bronchial cancer - History of small cell lung CA, currently stable  Day of Discharge BP 127/60 (BP Location: Left Arm)   Pulse 72   Temp 98.3 F (36.8 C) (Oral)   Resp 20   Ht 6\' 2"  (1.88 m)   Wt 97.5 kg (215 lb)   SpO2 100%   BMI 27.60 kg/m   Physical Exam: General: Alert and awake oriented x3 not in any acute distress. HEENT: anicteric sclera, pupils reactive to light and accommodation CVS: S1-S2 clear no murmur rubs or gallops Chest: clear to auscultation bilaterally, no wheezing rales or rhonchi Abdomen: soft nontender, nondistended, normal bowel sounds Extremities: no cyanosis, clubbing or edema noted bilaterally    The results of significant diagnostics from this hospitalization (including imaging, microbiology, ancillary and laboratory) are listed below for reference.    LAB RESULTS: Basic Metabolic  Panel:  Recent Labs Lab 05/16/17 0635 05/17/17 0336  NA 134* 136  K 3.7 3.6  CL 101 102  CO2 28 28  GLUCOSE 136* 119*  BUN 11 12  CREATININE 1.06 0.93  CALCIUM 8.1* 8.1*   Liver Function Tests: No results for input(s): AST, ALT, ALKPHOS, BILITOT, PROT, ALBUMIN in the last 168 hours. No results for input(s): LIPASE, AMYLASE in the last 168 hours. No results for input(s): AMMONIA in the last 168 hours. CBC:  Recent Labs Lab  05/14/17 1146  05/16/17 0635 05/17/17 0336  WBC 9.3  < > 8.7 8.9  NEUTROABS 7.5  --   --   --   HGB 12.3*  < > 9.4* 8.4*  HCT 36.8*  < > 28.9* 26.2*  MCV 95.1  < > 97.6 96.3  PLT 153  < > 138* 128*  < > = values in this interval not displayed. Cardiac Enzymes: No results for input(s): CKTOTAL, CKMB, CKMBINDEX, TROPONINI in the last 168 hours. BNP: Invalid input(s): POCBNP CBG: No results for input(s): GLUCAP in the last 168 hours.  Significant Diagnostic Studies:  Dg Chest 1 View  Result Date: 05/14/2017 CLINICAL DATA:  Leg pain after fall. EXAM: CHEST 1 VIEW COMPARISON:  June 06, 2009 FINDINGS: The patient is status post left pneumonectomy with complete opacification of the left hemithorax. No focal infiltrate identified. No acute interval changes. IMPRESSION: No acute abnormality. Electronically Signed   By: Dorise Bullion III M.D   On: 05/14/2017 12:34   Dg Knee Complete 4 Views Right  Result Date: 05/14/2017 CLINICAL DATA:  Pain after fall. EXAM: RIGHT KNEE - COMPLETE 4+ VIEW COMPARISON:  None. FINDINGS: The patient is status post knee replacement. The proximal fibula and tibia are intact. There is a comminuted displaced fracture of the distal femur. The fracture line extends nearly to the level of the knee replacement hardware in the distal femur. 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 IMPRESSION: Comminuted displaced fracture of the distal femur extending nearly to the level of the knee replacement hardware. Lipoma hemarthrosis. Electronically Signed   By: Dorise Bullion III M.D   On: 05/14/2017 12:31   Dg Knee Right Port  Result Date: 05/14/2017 CLINICAL DATA:  Distal femur fracture.  Patient in leg brace. EXAM: PORTABLE RIGHT KNEE - 1-2 VIEW COMPARISON:  Knee radiograph 05/14/2017 FINDINGS: There is a comminuted displaced distal femur fracture. There is posterior displacement of the distal fracture fragments. Overlying soft tissue swelling. Patient  status post knee arthroplasty. Note is made of a lipohemarthrosis. IMPRESSION: Comminuted displaced distal femur fracture. Electronically Signed   By: Lovey Newcomer M.D.   On: 05/14/2017 16:28   Dg C-arm 61-120 Min  Result Date: 05/15/2017 CLINICAL DATA:  Distal femur fracture.  ORIF. EXAM: RIGHT FEMUR 2 VIEWS; DG C-ARM 61-120 MIN COMPARISON:  05/14/2017. FINDINGS: Four intraoperative spot fluoro film show placement of a lateral cortical plate with 4 proximal interlocking screws. Distal interlocking screws not well visualized due to the existing knee replacement. IMPRESSION: Status post ORIF for comminuted distal femur fracture. No evidence for immediate hardware complications. Electronically Signed   By: Misty Stanley M.D.   On: 05/15/2017 09:32   Dg Femur, Min 2 Views Right  Result Date: 05/15/2017 CLINICAL DATA:  Distal femur fracture.  ORIF. EXAM: RIGHT FEMUR 2 VIEWS; DG C-ARM 61-120 MIN COMPARISON:  05/14/2017. FINDINGS: Four intraoperative spot fluoro film show placement of a lateral cortical plate with 4 proximal interlocking screws. Distal  interlocking screws not well visualized due to the existing knee replacement. IMPRESSION: Status post ORIF for comminuted distal femur fracture. No evidence for immediate hardware complications. Electronically Signed   By: Misty Stanley M.D.   On: 05/15/2017 09:32   Dg Femur, Min 2 Views Right  Result Date: 05/14/2017 CLINICAL DATA:  Pain after fall EXAM: RIGHT FEMUR 2 VIEWS COMPARISON:  None. FINDINGS: Comminuted displaced fracture of the distal femur as described on the knee x-ray. IMPRESSION: Comminuted displaced fracture of the distal femur as described on the knee x-ray. The proximal femur is intact. Electronically Signed   By: Dorise Bullion III M.D   On: 05/14/2017 12:32    2D ECHO:   Disposition and Follow-up: Discharge Instructions    Diet - low sodium heart healthy    Complete by:  As directed    Increase activity slowly    Complete by:  As  directed    Non weight bearing    Complete by:  As directed        DISPOSITION: [place] information for follow-up providers    Newt Minion, MD Follow up in 1 week(s).   Specialty:  Orthopedic Surgery Contact information: [place] [address] [phone]            Contact information for after-discharge care    Destination    [facility] Follow up.   Specialty:  Skilled Nursing Facility Contact information: [place] [phone]                   Time spent on Discharge: 61mins   Signed:   Estill Cotta M.D. [facility] 05/17/2017, 11:36 AM Pager: [phone]

## 2017-05-25 ENCOUNTER — Ambulatory Visit (INDEPENDENT_AMBULATORY_CARE_PROVIDER_SITE_OTHER): Payer: Medicare Other | Admitting: Family

## 2017-05-25 ENCOUNTER — Encounter (INDEPENDENT_AMBULATORY_CARE_PROVIDER_SITE_OTHER): Payer: Self-pay | Admitting: Family

## 2017-05-25 ENCOUNTER — Ambulatory Visit (INDEPENDENT_AMBULATORY_CARE_PROVIDER_SITE_OTHER): Payer: Medicare Other

## 2017-05-25 VITALS — Ht 74.0 in | Wt 215.0 lb

## 2017-05-25 DIAGNOSIS — S72451D Displaced supracondylar fracture without intracondylar extension of lower end of right femur, subsequent encounter for closed fracture with routine healing: Secondary | ICD-10-CM

## 2017-05-25 NOTE — Progress Notes (Signed)
   Post-Op Visit Note   Patient: Craig Chapman           Date of Birth: 03-04-1928           MRN: [id number] Visit Date: 05/25/2017 PCP: Patient, No Pcp Per  Chief Complaint:  Chief Complaint  Patient presents with  . Left Leg - Routine Post Op    05/15/17 ORIF distal femur fracture Right 10 days post op    HPI:   The patient is an 83 year old gentleman seen 10 days status post ORIF for a comminuted supracondylar femur fracture on right. Does have right TKA as well. Has been at [facility] for rehab. Is nonweight bearing on the right.     Ortho Exam Incision is well approximated with staples, healing well. No gaping, drainage, erythema or sign of infection. Moderate swelling to knee.   Visit Diagnoses:  1. Closed comminuted supracondylar fracture of right femur with routine healing, subsequent encounter     Plan: staples harvested. Continue nonweight bearing. Will need distal femur views at follow up.  Follow-Up Instructions: Return in about 2 weeks (around 06/08/2017).   Imaging: No results found.  Orders:  Orders Placed This Encounter  Procedures  . XR FEMUR, MIN 2 VIEWS RIGHT   No orders of the defined types were placed in this encounter.    PMFS History: Patient Active Problem List   Diagnosis Date Noted  . Closed comminuted supracondylar fracture of right femur ([place]) 05/14/2017  . PAF (paroxysmal atrial fibrillation) ([place]) 05/14/2017  . Hypertension 05/14/2017  . History of lung or bronchial cancer 05/14/2017  . Closed comminuted supracondylar fracture of femur, right, initial encounter ([place])   . Fall    Past Medical History:  Diagnosis Date  . Arthritis   . Atrial fibrillation ([place])   . History of lung or bronchial cancer   . Hypertension   . Lymphoma ([place])   . TIA (transient ischemic attack)     History reviewed. No pertinent family history.  Past Surgical History:  Procedure Laterality Date  . BACK SURGERY    . CHOLECYSTECTOMY    .  HERNIA REPAIR    . ORIF FEMUR FRACTURE Right 05/15/2017   Procedure: OPEN REDUCTION INTERNAL FIXATION (ORIF) DISTAL FEMUR FRACTURE;  Surgeon: Newt Minion, MD;  Location: [facility];  Service: Orthopedics;  Laterality: Right;  . PNEUMONECTOMY    . REPLACEMENT TOTAL KNEE BILATERAL    . ROTATOR CUFF REPAIR     Social History   Occupational History  . Not on file.   Social History Main Topics  . Smoking status: Former Research scientist (life sciences)  . Smokeless tobacco: Never Used  . Alcohol use No  . Drug use: No  . Sexual activity: Not on file

## 2017-06-08 ENCOUNTER — Encounter (INDEPENDENT_AMBULATORY_CARE_PROVIDER_SITE_OTHER): Payer: Self-pay | Admitting: Orthopedic Surgery

## 2017-06-08 ENCOUNTER — Ambulatory Visit (INDEPENDENT_AMBULATORY_CARE_PROVIDER_SITE_OTHER): Payer: No Typology Code available for payment source

## 2017-06-08 ENCOUNTER — Ambulatory Visit (INDEPENDENT_AMBULATORY_CARE_PROVIDER_SITE_OTHER): Payer: Medicare Other | Admitting: Family

## 2017-06-08 VITALS — Ht 74.0 in | Wt 215.0 lb

## 2017-06-08 DIAGNOSIS — M79661 Pain in right lower leg: Secondary | ICD-10-CM | POA: Diagnosis not present

## 2017-06-08 DIAGNOSIS — S72451D Displaced supracondylar fracture without intracondylar extension of lower end of right femur, subsequent encounter for closed fracture with routine healing: Secondary | ICD-10-CM

## 2017-06-09 NOTE — Progress Notes (Signed)
   Post-Op Visit Note   Patient: MARS SCHEAFFER           Date of Birth: 28-Jan-1928           MRN: [id number] Visit Date: 06/08/2017 PCP: Patient, No Pcp Per  Chief Complaint:  Chief Complaint  Patient presents with  . Right Leg - Routine Post Op    05/15/17 ORIF distal femur fracture     HPI:   The patient is an 83 year old gentleman seen 10 days status post ORIF for a comminuted supracondylar femur fracture on right. Does have right TKA as well. Has been at [facility] for rehab. Is nonweight bearing on the right.   concerned about continued knee swelling. Pain much improved. Continues to be wheelchair for nonweight bearing.  Ortho Exam Incision is well healed. No erythema or sign of infection. Moderate swelling to knee. No e ffusion.  Visit Diagnoses:  1. Pain in right lower leg   2. Closed comminuted supracondylar fracture of right femur with routine healing, subsequent encounter     Plan: Begin touchdown weight bearing with PT.  Will need distal femur views at follow up.  Follow-Up Instructions: Return in about 3 weeks (around 06/29/2017).   Imaging: No results found.  Orders:  Orders Placed This Encounter  Procedures  . XR FEMUR, MIN 2 VIEWS RIGHT   No orders of the defined types were placed in this encounter.    PMFS History: Patient Active Problem List   Diagnosis Date Noted  . Closed comminuted supracondylar fracture of right femur ([place]) 05/14/2017  . PAF (paroxysmal atrial fibrillation) ([place]) 05/14/2017  . Hypertension 05/14/2017  . History of lung or bronchial cancer 05/14/2017  . Closed comminuted supracondylar fracture of femur, right, initial encounter ([place])   . Fall    Past Medical History:  Diagnosis Date  . Arthritis   . Atrial fibrillation ([place])   . History of lung or bronchial cancer   . Hypertension   . Lymphoma ([place])   . TIA (transient ischemic attack)     No family history on file.  Past Surgical History:  Procedure  Laterality Date  . BACK SURGERY    . CHOLECYSTECTOMY    . HERNIA REPAIR    . ORIF FEMUR FRACTURE Right 05/15/2017   Procedure: OPEN REDUCTION INTERNAL FIXATION (ORIF) DISTAL FEMUR FRACTURE;  Surgeon: Newt Minion, MD;  Location: [facility];  Service: Orthopedics;  Laterality: Right;  . PNEUMONECTOMY    . REPLACEMENT TOTAL KNEE BILATERAL    . ROTATOR CUFF REPAIR     Social History   Occupational History  . Not on file.   Social History Main Topics  . Smoking status: Former Research scientist (life sciences)  . Smokeless tobacco: Never Used  . Alcohol use No  . Drug use: No  . Sexual activity: Not on file

## 2017-06-22 ENCOUNTER — Encounter (INDEPENDENT_AMBULATORY_CARE_PROVIDER_SITE_OTHER): Payer: Self-pay | Admitting: Orthopedic Surgery

## 2017-06-22 ENCOUNTER — Ambulatory Visit (INDEPENDENT_AMBULATORY_CARE_PROVIDER_SITE_OTHER): Payer: Medicare Other | Admitting: Orthopedic Surgery

## 2017-06-22 ENCOUNTER — Ambulatory Visit (INDEPENDENT_AMBULATORY_CARE_PROVIDER_SITE_OTHER): Payer: No Typology Code available for payment source

## 2017-06-22 DIAGNOSIS — S72451D Displaced supracondylar fracture without intracondylar extension of lower end of right femur, subsequent encounter for closed fracture with routine healing: Secondary | ICD-10-CM

## 2017-06-22 DIAGNOSIS — M25551 Pain in right hip: Secondary | ICD-10-CM

## 2017-06-22 NOTE — Progress Notes (Signed)
Office Visit Note   Patient: Craig Chapman           Date of Birth: 07-10-1928           MRN: [id number] Visit Date: 06/22/2017              Requested by: No referring provider defined for this encounter. PCP: Patient, No Pcp Per  Chief Complaint  Patient presents with  . Right Leg - Routine Post Op      HPI: Patient is 5 weeks status post open reduction internal fixation for supracondylar right femur fracture with a total knee arthroplasty patient complains of increasing right hip pain. He denies any symptoms with weightbearing.  Assessment & Plan: Visit Diagnoses:  1. Closed displaced supracondylar fracture without intracondylar extension of lower end of right femur with routine healing   2. Right hip pain     Plan: We'll advance him to weightbearing as tolerated he may discharge to home once he is safe with physical therapy. His hip pain is most likely due to his altered gait.  Follow-Up Instructions: Return in about 4 weeks (around 07/20/2017).   Ortho Exam  Patient is alert, oriented, no adenopathy, well-dressed, normal affect, normal respiratory effort. Examination patient's incision is well-healed there is no swelling he lacks about 5 to full extension. He has no pain with range of motion of his knee. The knee is stable.  Imaging: Xr Femur, Min 2 Views Right  Result Date: 06/22/2017 Two-view radiographs of the right femur shows no evidence of a proximal femur fracture no femoral neck fracture no intertrochanteric fracture no pelvic fracture. The radiographs at the knee show stable alignment of the supracondylar femur fracture stable internal fixation with good callous formation at the fracture site.  No images are attached to the encounter.  Labs: Lab Results  Component Value Date   HGBA1C  08/28/2009    5.7 (NOTE) The ADA recommends the following therapeutic goal for glycemic control related to Hgb A1c measurement: Goal of therapy: <6.5 Hgb A1c  Reference:  American Diabetes Association: Clinical Practice Recommendations 2010, Diabetes Care, 2010, 33: (Suppl  1).   ESRSEDRATE 20 11/03/2006   ESRSEDRATE 24 (H) 04/22/2006   ESRSEDRATE 36 (H) 02/10/2006    Orders:  Orders Placed This Encounter  Procedures  . XR FEMUR, MIN 2 VIEWS RIGHT   No orders of the defined types were placed in this encounter.    Procedures: No procedures performed  Clinical Data: No additional findings.  ROS:  All other systems negative, except as noted in the HPI. Review of Systems  Objective: Vital Signs: There were no vitals taken for this visit.  Specialty Comments:  No specialty comments available.  PMFS History: Patient Active Problem List   Diagnosis Date Noted  . Right hip pain 06/22/2017  . Closed comminuted supracondylar fracture of right femur ([place]) 05/14/2017  . PAF (paroxysmal atrial fibrillation) ([place]) 05/14/2017  . Hypertension 05/14/2017  . History of lung or bronchial cancer 05/14/2017  . Closed comminuted supracondylar fracture of femur, right, initial encounter ([place])   . Fall    Past Medical History:  Diagnosis Date  . Arthritis   . Atrial fibrillation ([place])   . History of lung or bronchial cancer   . Hypertension   . Lymphoma ([place])   . TIA (transient ischemic attack)     No family history on file.  Past Surgical History:  Procedure Laterality Date  . BACK SURGERY    . CHOLECYSTECTOMY    .  HERNIA REPAIR    . ORIF FEMUR FRACTURE Right 05/15/2017   Procedure: OPEN REDUCTION INTERNAL FIXATION (ORIF) DISTAL FEMUR FRACTURE;  Surgeon: Newt Minion, MD;  Location: [facility];  Service: Orthopedics;  Laterality: Right;  . PNEUMONECTOMY    . REPLACEMENT TOTAL KNEE BILATERAL    . ROTATOR CUFF REPAIR     Social History   Occupational History  . Not on file.   Social History Main Topics  . Smoking status: Former Research scientist (life sciences)  . Smokeless tobacco: Never Used  . Alcohol use No  . Drug use: No  . Sexual activity: Not on file

## 2017-07-04 ENCOUNTER — Telehealth (INDEPENDENT_AMBULATORY_CARE_PROVIDER_SITE_OTHER): Payer: Self-pay | Admitting: Orthopedic Surgery

## 2017-07-04 NOTE — Telephone Encounter (Signed)
Craig Chapman (PT) with [facility] called left message needing verbal orders 3 wk 2 and twice a wk for 4 wks    Cancel H H aid   Patient refusing H H aid at this time     The number to contact Craig Chapman is [phone]

## 2017-07-04 NOTE — Telephone Encounter (Signed)
I called and spoke with Craig Chapman to give verbal ok for physical therapy.

## 2017-07-20 ENCOUNTER — Encounter (INDEPENDENT_AMBULATORY_CARE_PROVIDER_SITE_OTHER): Payer: Self-pay | Admitting: Orthopedic Surgery

## 2017-07-20 ENCOUNTER — Ambulatory Visit (INDEPENDENT_AMBULATORY_CARE_PROVIDER_SITE_OTHER): Payer: Medicare Other | Admitting: Orthopedic Surgery

## 2017-07-20 DIAGNOSIS — S72451D Displaced supracondylar fracture without intracondylar extension of lower end of right femur, subsequent encounter for closed fracture with routine healing: Secondary | ICD-10-CM

## 2017-07-20 NOTE — Progress Notes (Signed)
Office Visit Note   Patient: Craig Chapman           Date of Birth: 21-Sep-1928           MRN: [id number] Visit Date: 07/20/2017              Requested by: No referring provider defined for this encounter. PCP: Patient, No Pcp Per  Chief Complaint  Patient presents with  . Right Leg - Pain  . Right Knee - Pain      HPI: Patient is an 83 year old gentleman who is 2 months status post open reduction internal fixation right distal periprosthetic femur fracture at the knee. Patient states he has pain with getting from a sitting to a standing position has no pain with weightbearing. Patient feels like he has grinding with the patella with flexion of the knee.  Assessment & Plan: Visit Diagnoses:  1. Closed displaced supracondylar fracture without intracondylar extension of lower end of right femur with routine healing     Plan: Recommend continue short arc quad strengthening and VMO strengthening. I do not think that a knee brace would help. Repeat 2 view radiographs of the right knee at follow-up.  Follow-Up Instructions: Return in about 4 weeks (around 08/17/2017).   Ortho Exam  Patient is alert, oriented, no adenopathy, well-dressed, normal affect, normal respiratory effort. Examination patient has a patella tracks midline there is crepitation with range of motion. Patient has no redness no cellulitis no signs of infection. There is swelling but no effusion. The patella does not sublux.  Imaging: No results found. No images are attached to the encounter.  Labs: Lab Results  Component Value Date   HGBA1C  08/28/2009    5.7 (NOTE) The ADA recommends the following therapeutic goal for glycemic control related to Hgb A1c measurement: Goal of therapy: <6.5 Hgb A1c  Reference: American Diabetes Association: Clinical Practice Recommendations 2010, Diabetes Care, 2010, 33: (Suppl  1).   ESRSEDRATE 20 11/03/2006   ESRSEDRATE 24 (H) 04/22/2006   ESRSEDRATE 36 (H) 02/10/2006     Orders:  No orders of the defined types were placed in this encounter.  No orders of the defined types were placed in this encounter.    Procedures: No procedures performed  Clinical Data: No additional findings.  ROS:  All other systems negative, except as noted in the HPI. Review of Systems  Objective: Vital Signs: There were no vitals taken for this visit.  Specialty Comments:  No specialty comments available.  PMFS History: Patient Active Problem List   Diagnosis Date Noted  . Right hip pain 06/22/2017  . Closed comminuted supracondylar fracture of right femur ([place]) 05/14/2017  . PAF (paroxysmal atrial fibrillation) ([place]) 05/14/2017  . Hypertension 05/14/2017  . History of lung or bronchial cancer 05/14/2017  . Closed comminuted supracondylar fracture of femur, right, initial encounter ([place])   . Fall    Past Medical History:  Diagnosis Date  . Arthritis   . Atrial fibrillation ([place])   . History of lung or bronchial cancer   . Hypertension   . Lymphoma ([place])   . TIA (transient ischemic attack)     No family history on file.  Past Surgical History:  Procedure Laterality Date  . BACK SURGERY    . CHOLECYSTECTOMY    . HERNIA REPAIR    . ORIF FEMUR FRACTURE Right 05/15/2017   Procedure: OPEN REDUCTION INTERNAL FIXATION (ORIF) DISTAL FEMUR FRACTURE;  Surgeon: Newt Minion, MD;  Location: [facility];  Service: Orthopedics;  Laterality: Right;  . PNEUMONECTOMY    . REPLACEMENT TOTAL KNEE BILATERAL    . ROTATOR CUFF REPAIR     Social History   Occupational History  . Not on file.   Social History Main Topics  . Smoking status: Former Research scientist (life sciences)  . Smokeless tobacco: Never Used  . Alcohol use No  . Drug use: No  . Sexual activity: Not on file

## 2017-08-17 ENCOUNTER — Ambulatory Visit (INDEPENDENT_AMBULATORY_CARE_PROVIDER_SITE_OTHER): Payer: Medicare Other | Admitting: Orthopedic Surgery

## 2017-08-18 ENCOUNTER — Ambulatory Visit (INDEPENDENT_AMBULATORY_CARE_PROVIDER_SITE_OTHER): Payer: Medicare Other | Admitting: Orthopedic Surgery

## 2017-08-18 ENCOUNTER — Ambulatory Visit (INDEPENDENT_AMBULATORY_CARE_PROVIDER_SITE_OTHER): Payer: Medicare Other

## 2017-08-18 DIAGNOSIS — S72451D Displaced supracondylar fracture without intracondylar extension of lower end of right femur, subsequent encounter for closed fracture with routine healing: Secondary | ICD-10-CM

## 2017-08-18 NOTE — Progress Notes (Signed)
Office Visit Note   Patient: Craig Chapman           Date of Birth: 10-28-1928           MRN: [id number] Visit Date: 08/18/2017              Requested by: No referring provider defined for this encounter. PCP: Patient, No Pcp Per  Chief Complaint  Patient presents with  . Right Leg - Pain  . Right Knee - Pain      HPI: Patient is a 83 year old gentleman who presents 3 months status post internal fixation for displaced supracondylar femur fracture of the right. Patient states that recently he has developed crepitation in the patellofemoral joint. He has difficulty getting from a sitting to a standing position. He goes down the stairs with his back and leg first.  Assessment & Plan: Visit Diagnoses:  1. Closed comminuted supracondylar fracture of right femur with routine healing, subsequent encounter     Plan: Recommended continue his home health physical therapy. Discussed that we could set up outpatient therapy but patient states he rather do the therapy on his own at home. Discussed the mechanism of the callus formation at the fracture site causing irritation in the patella tracking.  Follow-Up Instructions: Return in about 4 weeks (around 09/15/2017).   Ortho Exam  Patient is alert, oriented, no adenopathy, well-dressed, normal affect, normal respiratory effort. Examination patient's leg is straight he has good range of motion there is no redness no cellulitis no signs of infection the incisions are well healed. On range of motion patient does have crepitation within the patella moved superiorly as he extends his knee this is directly in contact with the callus formation at the fracture site.  Imaging: Xr Knee 1-2 Views Right  Result Date: 08/18/2017 Two-view radiographs the right knee show stable alignment of both AP and lateral planes patient has laid out extra callus formation at the fracture site.  No images are attached to the encounter.  Labs: Lab Results    Component Value Date   HGBA1C  08/28/2009    5.7 (NOTE) The ADA recommends the following therapeutic goal for glycemic control related to Hgb A1c measurement: Goal of therapy: <6.5 Hgb A1c  Reference: American Diabetes Association: Clinical Practice Recommendations 2010, Diabetes Care, 2010, 33: (Suppl  1).   ESRSEDRATE 20 11/03/2006   ESRSEDRATE 24 (H) 04/22/2006   ESRSEDRATE 36 (H) 02/10/2006    Orders:  Orders Placed This Encounter  Procedures  . XR Knee 1-2 Views Right   No orders of the defined types were placed in this encounter.    Procedures: No procedures performed  Clinical Data: No additional findings.  ROS:  All other systems negative, except as noted in the HPI. Review of Systems  Objective: Vital Signs: There were no vitals taken for this visit.  Specialty Comments:  No specialty comments available.  PMFS History: Patient Active Problem List   Diagnosis Date Noted  . Right hip pain 06/22/2017  . Closed comminuted supracondylar fracture of right femur ([place]) 05/14/2017  . PAF (paroxysmal atrial fibrillation) ([place]) 05/14/2017  . Hypertension 05/14/2017  . History of lung or bronchial cancer 05/14/2017  . Closed comminuted supracondylar fracture of femur, right, initial encounter ([place])   . Fall    Past Medical History:  Diagnosis Date  . Arthritis   . Atrial fibrillation ([place])   . History of lung or bronchial cancer   . Hypertension   . Lymphoma ([place])   .  TIA (transient ischemic attack)     No family history on file.  Past Surgical History:  Procedure Laterality Date  . BACK SURGERY    . CHOLECYSTECTOMY    . HERNIA REPAIR    . ORIF FEMUR FRACTURE Right 05/15/2017   Procedure: OPEN REDUCTION INTERNAL FIXATION (ORIF) DISTAL FEMUR FRACTURE;  Surgeon: Newt Minion, MD;  Location: [facility];  Service: Orthopedics;  Laterality: Right;  . PNEUMONECTOMY    . REPLACEMENT TOTAL KNEE BILATERAL    . ROTATOR CUFF REPAIR     Social History   Occupational  History  . Not on file.   Social History Main Topics  . Smoking status: Former Research scientist (life sciences)  . Smokeless tobacco: Never Used  . Alcohol use No  . Drug use: No  . Sexual activity: Not on file

## 2017-08-26 ENCOUNTER — Telehealth (INDEPENDENT_AMBULATORY_CARE_PROVIDER_SITE_OTHER): Payer: Self-pay | Admitting: Orthopedic Surgery

## 2017-08-26 NOTE — Telephone Encounter (Signed)
Gennaro Africa (PT) with [facility] called needing orders for patient to start outpatient therapy. She advised patient want to go to [facility] (PT) in [place]. Patient want to start PT the week of Oct. 29th.  The ph# is [phone]  The fax# is [phone]  The ph# to contact Anda Kraft is [phone]

## 2017-08-26 NOTE — Telephone Encounter (Signed)
Faxed order

## 2017-08-30 NOTE — Telephone Encounter (Signed)
[facility]  Please resend Fax for referral # [phone]

## 2017-08-30 NOTE — Telephone Encounter (Signed)
Faxed to new fax number at landline fax at front office

## 2017-09-19 ENCOUNTER — Ambulatory Visit (INDEPENDENT_AMBULATORY_CARE_PROVIDER_SITE_OTHER): Payer: Medicare Other | Admitting: Orthopedic Surgery

## 2017-09-19 ENCOUNTER — Encounter (INDEPENDENT_AMBULATORY_CARE_PROVIDER_SITE_OTHER): Payer: Self-pay | Admitting: Orthopedic Surgery

## 2017-09-19 VITALS — Ht 74.0 in | Wt 215.0 lb

## 2017-09-19 DIAGNOSIS — S72451D Displaced supracondylar fracture without intracondylar extension of lower end of right femur, subsequent encounter for closed fracture with routine healing: Secondary | ICD-10-CM | POA: Diagnosis not present

## 2017-09-19 NOTE — Progress Notes (Signed)
Office Visit Note   Patient: Craig Chapman           Date of Birth: Jun 18, 1928           MRN: [id number] Visit Date: 09/19/2017              Requested by: No referring provider defined for this encounter. PCP: Craig Chapman  Chief Complaint  Patient presents with  . Right Knee - Follow-up    05/15/17 ORIF right distal femur fx      HPI: Patient is a 83 year old gentleman who presents 4 months status post open reduction internal fixation of a distal supracondylar femur fracture in the right with a total knee arthroplasty.  Patient states he is having some buttocks pain occasionally and has some thigh pain and weakness occasionally as well.  Patient states it is difficult for him to get from a sitting to a standing position.  Assessment & Plan: Visit Diagnoses:  1. Closed comminuted supracondylar fracture of right femur with routine healing, subsequent encounter     Plan: Patient was given a prescription to continue physical therapy for strengthening.  I feel the crepitation in the patellofemoral joint should resolve as the callus tissue resolves.  Follow-Up Instructions: Return in about 4 weeks (around 10/17/2017).   Ortho Exam  Patient is alert, oriented, no adenopathy, well-dressed, normal affect, normal respiratory effort. Patient has difficulty getting from a sitting to a standing position but he can do it independently.  His patella tracks midline with getting from sitting to standing.  There is crepitation as the knee extends with the of the patella rubbing on the callus bone from the fracture site.  There is no redness no cellulitis no signs of infection the patella is midline varus and valgus stress is stable.  Patient has a negative sciatic tension sign on the right no focal motor weakness.  His pain is primarily in the buttocks region to the lateral thigh.  There is no pain with range of motion of his hip.  His radiograph of the hip shows a congruent joint space no  evidence of a fracture.  Imaging: No results found. No images are attached to the encounter.  Labs: Lab Results  Component Value Date   HGBA1C  08/28/2009    5.7 (NOTE) The ADA recommends the following therapeutic goal for glycemic control related to Hgb A1c measurement: Goal of therapy: <6.5 Hgb A1c  Reference: American Diabetes Association: Clinical Practice Recommendations 2010, Diabetes Care, 2010, 33: (Suppl  1).   ESRSEDRATE 20 11/03/2006   ESRSEDRATE 24 (H) 04/22/2006   ESRSEDRATE 36 (H) 02/10/2006    Orders:  No orders of the defined types were placed in this encounter.  No orders of the defined types were placed in this encounter.    Procedures: No procedures performed  Clinical Data: No additional findings.  ROS:  All other systems negative, except as noted in the HPI. Review of Systems  Objective: Vital Signs: Ht 6\' 2"  (1.88 m)   Wt 215 lb (97.5 kg)   BMI 27.60 kg/m   Specialty Comments:  No specialty comments available.  PMFS History: Patient Active Problem List   Diagnosis Date Noted  . Right hip pain 06/22/2017  . Closed comminuted supracondylar fracture of right femur ([place]) 05/14/2017  . PAF (paroxysmal atrial fibrillation) ([place]) 05/14/2017  . Hypertension 05/14/2017  . History of lung or bronchial cancer 05/14/2017  . Closed comminuted supracondylar fracture of femur, right, initial encounter ([place])   .  Fall    Past Medical History:  Diagnosis Date  . Arthritis   . Atrial fibrillation ([place])   . History of lung or bronchial cancer   . Hypertension   . Lymphoma ([place])   . TIA (transient ischemic attack)     History reviewed. No pertinent family history.  Past Surgical History:  Procedure Laterality Date  . BACK SURGERY    . CHOLECYSTECTOMY    . HERNIA REPAIR    . PNEUMONECTOMY    . REPLACEMENT TOTAL KNEE BILATERAL    . ROTATOR CUFF REPAIR     Social History   Occupational History  . Not on file  Tobacco Use  . Smoking status:  Former Research scientist (life sciences)  . Smokeless tobacco: Never Used  Substance and Sexual Activity  . Alcohol use: No  . Drug use: No  . Sexual activity: Not on file

## 2017-10-17 ENCOUNTER — Ambulatory Visit (INDEPENDENT_AMBULATORY_CARE_PROVIDER_SITE_OTHER): Payer: Medicare Other | Admitting: Orthopedic Surgery

## 2017-11-07 ENCOUNTER — Encounter (INDEPENDENT_AMBULATORY_CARE_PROVIDER_SITE_OTHER): Payer: Self-pay | Admitting: Orthopedic Surgery

## 2017-11-07 ENCOUNTER — Ambulatory Visit (INDEPENDENT_AMBULATORY_CARE_PROVIDER_SITE_OTHER): Payer: Medicare Other | Admitting: Orthopedic Surgery

## 2017-11-07 ENCOUNTER — Ambulatory Visit (INDEPENDENT_AMBULATORY_CARE_PROVIDER_SITE_OTHER): Payer: Medicare Other

## 2017-11-07 DIAGNOSIS — M25561 Pain in right knee: Secondary | ICD-10-CM

## 2017-11-07 DIAGNOSIS — M25562 Pain in left knee: Secondary | ICD-10-CM | POA: Diagnosis not present

## 2017-11-07 DIAGNOSIS — S72451D Displaced supracondylar fracture without intracondylar extension of lower end of right femur, subsequent encounter for closed fracture with routine healing: Secondary | ICD-10-CM | POA: Diagnosis not present

## 2017-11-07 NOTE — Progress Notes (Signed)
Office Visit Note   Patient: Craig Chapman           Date of Birth: 28-Dec-1927           MRN: [id number] Visit Date: 11/07/2017              Requested by: No referring provider defined for this encounter. PCP: Patient, No Pcp Per  Chief Complaint  Patient presents with  . Right Knee - Pain  . Right Leg - Routine Post Op      HPI: Patient is status post open reduction internal fixation for supracondylar femur fracture with total knee arthroplasty.  Patient has significant crepitation with range of motion in the patellofemoral joint he has worked with physical therapy and is still symptomatic.  He is about 5 months out from his injury.  Assessment & Plan: Visit Diagnoses:  1. Closed comminuted supracondylar fracture of right femur with routine healing, subsequent encounter   2. Acute pain of right knee     Plan: Will plan for CT scan of the right knee to evaluate for any bony impingement or any failure of the patella button.  Discussed surgical intervention can include debridement of any hypertrophic bone revision of the patella or patellectomy knee.  Follow-Up Instructions: Return in about 2 weeks (around 11/21/2017).   Ortho Exam  Patient is alert, oriented, no adenopathy, well-dressed, normal affect, normal respiratory effort. Examination patient ambulates with a cane.  With his knee extended going into a flexed position the patella tracks smoothly in the trochlear groove of the total knee implant.  With his knee flexed going into extension there is significant crepitation in the patellofemoral joint.  The patella does not sublux.  There is no redness no cellulitis no signs of infection.  Imaging: Xr Femur, Min 2 Views Right  Result Date: 11/07/2017 2 view radiographs of the right femur shows no hardware failure with stable alignment in both AP and lateral planes of the distal femur fracture.  Xr Knee 1-2 Views Right  Result Date: 11/07/2017 2 view radiographs of the  right knee shows a congruent patellofemoral joint with no subluxation or dislocation.  AP and lateral radiographs show excellent healing of the comminuted distal femur fracture.  No images are attached to the encounter.  Labs: Lab Results  Component Value Date   HGBA1C  08/28/2009    5.7 (NOTE) The ADA recommends the following therapeutic goal for glycemic control related to Hgb A1c measurement: Goal of therapy: <6.5 Hgb A1c  Reference: American Diabetes Association: Clinical Practice Recommendations 2010, Diabetes Care, 2010, 33: (Suppl  1).   ESRSEDRATE 20 11/03/2006   ESRSEDRATE 24 (H) 04/22/2006   ESRSEDRATE 36 (H) 02/10/2006    @LABSALLVALUES (HGBA1)@  There is no height or weight on file to calculate BMI.  Orders:  Orders Placed This Encounter  Procedures  . XR FEMUR, MIN 2 VIEWS RIGHT  . XR Knee 1-2 Views Right   No orders of the defined types were placed in this encounter.    Procedures: No procedures performed  Clinical Data: No additional findings.  ROS:  All other systems negative, except as noted in the HPI. Review of Systems  Objective: Vital Signs: There were no vitals taken for this visit.  Specialty Comments:  No specialty comments available.  PMFS History: Patient Active Problem List   Diagnosis Date Noted  . Acute pain of right knee 11/07/2017  . Right hip pain 06/22/2017  . Closed comminuted supracondylar fracture of right femur (  [place]) 05/14/2017  . PAF (paroxysmal atrial fibrillation) ([place]) 05/14/2017  . Hypertension 05/14/2017  . History of lung or bronchial cancer 05/14/2017  . Closed comminuted supracondylar fracture of femur, right, initial encounter ([place])   . Fall    Past Medical History:  Diagnosis Date  . Arthritis   . Atrial fibrillation ([place])   . History of lung or bronchial cancer   . Hypertension   . Lymphoma ([place])   . TIA (transient ischemic attack)     History reviewed. No pertinent family history.  Past Surgical  History:  Procedure Laterality Date  . BACK SURGERY    . CHOLECYSTECTOMY    . HERNIA REPAIR    . ORIF FEMUR FRACTURE Right 05/15/2017   Procedure: OPEN REDUCTION INTERNAL FIXATION (ORIF) DISTAL FEMUR FRACTURE;  Surgeon: Newt Minion, MD;  Location: [facility];  Service: Orthopedics;  Laterality: Right;  . PNEUMONECTOMY    . REPLACEMENT TOTAL KNEE BILATERAL    . ROTATOR CUFF REPAIR     Social History   Occupational History  . Not on file  Tobacco Use  . Smoking status: Former Research scientist (life sciences)  . Smokeless tobacco: Never Used  Substance and Sexual Activity  . Alcohol use: No  . Drug use: No  . Sexual activity: Not on file

## 2017-11-11 ENCOUNTER — Ambulatory Visit
Admission: RE | Admit: 2017-11-11 | Discharge: 2017-11-11 | Disposition: A | Discharge status: Home / Self Care | Payer: Medicare Other | Source: Ambulatory Visit | Attending: Orthopedic Surgery | Admitting: Orthopedic Surgery

## 2017-11-11 DIAGNOSIS — S72451D Displaced supracondylar fracture without intracondylar extension of lower end of right femur, subsequent encounter for closed fracture with routine healing: Secondary | ICD-10-CM

## 2017-11-18 ENCOUNTER — Telehealth (INDEPENDENT_AMBULATORY_CARE_PROVIDER_SITE_OTHER): Payer: Self-pay | Admitting: Radiology

## 2017-11-21 ENCOUNTER — Encounter (INDEPENDENT_AMBULATORY_CARE_PROVIDER_SITE_OTHER): Payer: Self-pay | Admitting: Orthopedic Surgery

## 2017-11-21 ENCOUNTER — Ambulatory Visit (INDEPENDENT_AMBULATORY_CARE_PROVIDER_SITE_OTHER): Payer: Medicare Other | Admitting: Orthopedic Surgery

## 2017-11-21 DIAGNOSIS — S72451D Displaced supracondylar fracture without intracondylar extension of lower end of right femur, subsequent encounter for closed fracture with routine healing: Secondary | ICD-10-CM | POA: Diagnosis not present

## 2017-11-21 NOTE — Telephone Encounter (Signed)
error 

## 2017-11-21 NOTE — Progress Notes (Signed)
Office Visit Note   Patient: Craig Chapman           Date of Birth: 1927/11/10           MRN: [id number] Visit Date: 11/21/2017              Requested by: No referring provider defined for this encounter. PCP: Patient, No Pcp Per  Chief Complaint  Patient presents with  . Right Knee - Follow-up    S/P CT Scan 11/11/17      HPI: Patient presents in follow-up status post periprosthetic supracondylar femur fracture.  Patient complains of pain in the patellofemoral joint and extension which she states is unbearable he states he cannot go on living like this.  Patient states that his total knee replacement was originally performed in [place].  He states that immediately postoperatively he did have some crepitation and pain in the patellofemoral joint and he did undergo return to the operating room for debridement of the patellofemoral joint.  Patient states he does not feel it is making any progress with therapy.  Assessment & Plan: Visit Diagnoses:  1. Closed comminuted supracondylar fracture of right femur with routine healing, subsequent encounter     Plan: Recommended follow-up in 4 weeks.  Discussed that there is increased risk with revision surgery within the knee.  Discussed that if he wants to proceed with surgery we would do an open procedure with debridement of the suprapatellar pouch possible revision of the patella component.  Discussed there is an increased risk of infection neurovascular injury increased risk of the wound not healing.  Follow-Up Instructions: Return in about 4 weeks (around 12/19/2017).   Ortho Exam  Patient is alert, oriented, no adenopathy, well-dressed, normal affect, normal respiratory effort. Examination patient can get from a sitting to a standing position independently.  Ambulates with a cane in the opposite hand and does have a mild limp.  With flexion there is no crepitation when his knee goes from 45 degrees of flexion to full extension  there is crepitation in the patellofemoral joint.  There is no pain laterally where the plate is.  Review of the CT scan shows no prominent screws or hardware that would affect the patellofemoral joint.  There is no redness no cellulitis no signs of infection.  Imaging: No results found. No images are attached to the encounter.  Labs: Lab Results  Component Value Date   HGBA1C  08/28/2009    5.7 (NOTE) The ADA recommends the following therapeutic goal for glycemic control related to Hgb A1c measurement: Goal of therapy: <6.5 Hgb A1c  Reference: American Diabetes Association: Clinical Practice Recommendations 2010, Diabetes Care, 2010, 33: (Suppl  1).   ESRSEDRATE 20 11/03/2006   ESRSEDRATE 24 (H) 04/22/2006   ESRSEDRATE 36 (H) 02/10/2006    @LABSALLVALUES (HGBA1)@  There is no height or weight on file to calculate BMI.  Orders:  No orders of the defined types were placed in this encounter.  No orders of the defined types were placed in this encounter.    Procedures: No procedures performed  Clinical Data: No additional findings.  ROS:  All other systems negative, except as noted in the HPI. Review of Systems  Objective: Vital Signs: There were no vitals taken for this visit.  Specialty Comments:  No specialty comments available.  PMFS History: Patient Active Problem List   Diagnosis Date Noted  . Acute pain of right knee 11/07/2017  . Right hip pain 06/22/2017  . Closed comminuted  supracondylar fracture of right femur ([place]) 05/14/2017  . PAF (paroxysmal atrial fibrillation) ([place]) 05/14/2017  . Hypertension 05/14/2017  . History of lung or bronchial cancer 05/14/2017  . Closed comminuted supracondylar fracture of femur, right, initial encounter ([place])   . Fall    Past Medical History:  Diagnosis Date  . Arthritis   . Atrial fibrillation ([place])   . History of lung or bronchial cancer   . Hypertension   . Lymphoma ([place])   . TIA (transient ischemic attack)      History reviewed. No pertinent family history.  Past Surgical History:  Procedure Laterality Date  . BACK SURGERY    . CHOLECYSTECTOMY    . HERNIA REPAIR    . ORIF FEMUR FRACTURE Right 05/15/2017   Procedure: OPEN REDUCTION INTERNAL FIXATION (ORIF) DISTAL FEMUR FRACTURE;  Surgeon: Newt Minion, MD;  Location: [facility];  Service: Orthopedics;  Laterality: Right;  . PNEUMONECTOMY    . REPLACEMENT TOTAL KNEE BILATERAL    . ROTATOR CUFF REPAIR     Social History   Occupational History  . Not on file  Tobacco Use  . Smoking status: Former Research scientist (life sciences)  . Smokeless tobacco: Never Used  Substance and Sexual Activity  . Alcohol use: No  . Drug use: No  . Sexual activity: Not on file

## 2017-12-26 ENCOUNTER — Encounter (INDEPENDENT_AMBULATORY_CARE_PROVIDER_SITE_OTHER): Payer: Self-pay | Admitting: Orthopedic Surgery

## 2017-12-26 ENCOUNTER — Ambulatory Visit (INDEPENDENT_AMBULATORY_CARE_PROVIDER_SITE_OTHER): Payer: Medicare Other | Admitting: Orthopedic Surgery

## 2017-12-26 DIAGNOSIS — S72451D Displaced supracondylar fracture without intracondylar extension of lower end of right femur, subsequent encounter for closed fracture with routine healing: Secondary | ICD-10-CM

## 2017-12-26 NOTE — Progress Notes (Signed)
Office Visit Note   Patient: Craig Chapman           Date of Birth: [birth year]           MRN: [id number] Visit Date: 12/26/2017              Requested by: No referring provider defined for this encounter. PCP: Patient, No Pcp Per  No chief complaint on file.     HPI: Patient is an 84 year old gentleman who presents follow-up for open reduction internal fixation supracondylar periprosthetic femur fracture on the right.  Patient is continue doing his strength training and exercise and while he states he still feels weak he states that he is about 75-80% improved from his last examination currently ambulates with a cane.  Assessment & Plan: Visit Diagnoses:  1. Closed comminuted supracondylar fracture of right femur with routine healing, subsequent encounter     Plan: Due to patient's continued improvement with strengthening will have him continue strengthening on his own for the next 4 weeks and reevaluate at that time.  I feel the patient will make continued improvement without surgery.  Follow-Up Instructions: Return in about 4 weeks (around 01/23/2018).   Ortho Exam  Patient is alert, oriented, no adenopathy, well-dressed, normal affect, normal respiratory effort. Examination patient uses a cane to get up from a sitting position.  He does have crepitation with range of motion of his knee in the patellofemoral joint but this is much improved I think some of the hypertrophic callus at the fracture site is resolving his quads are improving the strength.  He still has quad atrophy but is showing improvement.  His knee is straight no varus or valgus malalignment.  Imaging: No results found. No images are attached to the encounter.  Labs: Lab Results  Component Value Date   HGBA1C  08/28/2009    5.7 (NOTE) The ADA recommends the following therapeutic goal for glycemic control related to Hgb A1c measurement: Goal of therapy: <6.5 Hgb A1c  Reference: American Diabetes  Association: Clinical Practice Recommendations 2010, Diabetes Care, 2010, 33: (Suppl  1).   ESRSEDRATE 20 11/03/2006   ESRSEDRATE 24 (H) 04/22/2006   ESRSEDRATE 36 (H) 02/10/2006    @LABSALLVALUES (HGBA1)@  There is no height or weight on file to calculate BMI.  Orders:  No orders of the defined types were placed in this encounter.  No orders of the defined types were placed in this encounter.    Procedures: No procedures performed  Clinical Data: No additional findings.  ROS:  All other systems negative, except as noted in the HPI. Review of Systems  Objective: Vital Signs: There were no vitals taken for this visit.  Specialty Comments:  No specialty comments available.  PMFS History: Patient Active Problem List   Diagnosis Date Noted  . Acute pain of right knee 11/07/2017  . Right hip pain 06/22/2017  . Closed comminuted supracondylar fracture of right femur ([place]) 05/14/2017  . PAF (paroxysmal atrial fibrillation) ([place]) 05/14/2017  . Hypertension 05/14/2017  . History of lung or bronchial cancer 05/14/2017  . Closed comminuted supracondylar fracture of femur, right, initial encounter ([place])   . Fall    Past Medical History:  Diagnosis Date  . Arthritis   . Atrial fibrillation ([place])   . History of lung or bronchial cancer   . Hypertension   . Lymphoma ([place])   . TIA (transient ischemic attack)     History reviewed. No pertinent family history.  Past Surgical History:  Procedure Laterality Date  . BACK SURGERY    . CHOLECYSTECTOMY    . HERNIA REPAIR    . ORIF FEMUR FRACTURE Right 05/15/2017   Procedure: OPEN REDUCTION INTERNAL FIXATION (ORIF) DISTAL FEMUR FRACTURE;  Surgeon: Newt Minion, MD;  Location: [facility];  Service: Orthopedics;  Laterality: Right;  . PNEUMONECTOMY    . REPLACEMENT TOTAL KNEE BILATERAL    . ROTATOR CUFF REPAIR     Social History   Occupational History  . Not on file  Tobacco Use  . Smoking status: Former Research scientist (life sciences)  . Smokeless  tobacco: Never Used  Substance and Sexual Activity  . Alcohol use: No  . Drug use: No  . Sexual activity: Not on file

## 2018-01-24 ENCOUNTER — Ambulatory Visit (INDEPENDENT_AMBULATORY_CARE_PROVIDER_SITE_OTHER): Payer: Medicare Other | Admitting: Orthopedic Surgery

## 2018-01-26 ENCOUNTER — Ambulatory Visit (INDEPENDENT_AMBULATORY_CARE_PROVIDER_SITE_OTHER): Payer: Medicare Other | Admitting: Orthopedic Surgery

## 2018-01-26 ENCOUNTER — Encounter (INDEPENDENT_AMBULATORY_CARE_PROVIDER_SITE_OTHER): Payer: Self-pay | Admitting: Orthopedic Surgery

## 2018-01-26 VITALS — Ht 74.0 in | Wt 215.0 lb

## 2018-01-26 DIAGNOSIS — S72451D Displaced supracondylar fracture without intracondylar extension of lower end of right femur, subsequent encounter for closed fracture with routine healing: Secondary | ICD-10-CM | POA: Diagnosis not present

## 2018-01-26 NOTE — Progress Notes (Signed)
Office Visit Note   Patient: Craig Chapman           Date of Birth: [birth year]           MRN: [id number] Visit Date: 01/26/2018              Requested by: No referring provider defined for this encounter. PCP: Patient, No Pcp Per  Chief Complaint  Patient presents with  . Right Knee - Follow-up      HPI: Patient is a 84 year old gentleman who complains of increasing pain in the patellofemoral joint right knee.  Patient is status post open reduction internal fixation with a lateral plate for the periprosthetic femur fracture with a total knee arthroplasty.  Patient complains of increasing crepitation and pain in the patellofemoral joint.  Patient states he cannot ambulate the way he wants to.  Assessment & Plan: Visit Diagnoses:  1. Closed comminuted supracondylar fracture of right femur with routine healing, subsequent encounter     Plan: Discussed with patient we could proceed with surgery which would require removal of the bony callus formation in the suprapatella area revision of the polyethylene of the patella.  Discussed the patient is increased risk of infection increased risks of complications.  Patient states he understands and wishes to proceed at this time.  Follow-Up Instructions: Return if symptoms worsen or fail to improve.   Ortho Exam  Patient is alert, oriented, no adenopathy, well-dressed, normal affect, normal respiratory effort. Examination patient has difficulty getting from a sitting to a standing position he uses a cane he has a slow gait.  When patient takes his knee from flexion to extension there is crepitation of the patellofemoral joint with the patella rubbing on scar tissue and bony callus formation.  There is no redness no cellulitis no effusion no signs of infection.  Imaging: No results found. No images are attached to the encounter.  Labs: Lab Results  Component Value Date   HGBA1C  08/28/2009    5.7 (NOTE) The ADA recommends the  following therapeutic goal for glycemic control related to Hgb A1c measurement: Goal of therapy: <6.5 Hgb A1c  Reference: American Diabetes Association: Clinical Practice Recommendations 2010, Diabetes Care, 2010, 33: (Suppl  1).   ESRSEDRATE 20 11/03/2006   ESRSEDRATE 24 (H) 04/22/2006   ESRSEDRATE 36 (H) 02/10/2006    @LABSALLVALUES (HGBA1)@  Body mass index is 27.6 kg/m.  Orders:  No orders of the defined types were placed in this encounter.  No orders of the defined types were placed in this encounter.    Procedures: No procedures performed  Clinical Data: No additional findings.  ROS:  All other systems negative, except as noted in the HPI. Review of Systems  Objective: Vital Signs: Ht 6\' 2"  (1.88 m)   Wt 215 lb (97.5 kg)   BMI 27.60 kg/m   Specialty Comments:  No specialty comments available.  PMFS History: Patient Active Problem List   Diagnosis Date Noted  . Acute pain of right knee 11/07/2017  . Right hip pain 06/22/2017  . Closed comminuted supracondylar fracture of right femur ([place]) 05/14/2017  . PAF (paroxysmal atrial fibrillation) ([place]) 05/14/2017  . Hypertension 05/14/2017  . History of lung or bronchial cancer 05/14/2017  . Closed comminuted supracondylar fracture of femur, right, initial encounter ([place])   . Fall    Past Medical History:  Diagnosis Date  . Arthritis   . Atrial fibrillation ([place])   . History of lung or bronchial cancer   .  Hypertension   . Lymphoma ([place])   . TIA (transient ischemic attack)     No family history on file.  Past Surgical History:  Procedure Laterality Date  . BACK SURGERY    . CHOLECYSTECTOMY    . HERNIA REPAIR    . ORIF FEMUR FRACTURE Right 05/15/2017   Procedure: OPEN REDUCTION INTERNAL FIXATION (ORIF) DISTAL FEMUR FRACTURE;  Surgeon: Newt Minion, MD;  Location: [facility];  Service: Orthopedics;  Laterality: Right;  . PNEUMONECTOMY    . REPLACEMENT TOTAL KNEE BILATERAL    . ROTATOR CUFF REPAIR     Social  History   Occupational History  . Not on file  Tobacco Use  . Smoking status: Former Research scientist (life sciences)  . Smokeless tobacco: Never Used  Substance and Sexual Activity  . Alcohol use: No  . Drug use: No  . Sexual activity: Not on file

## 2018-02-10 ENCOUNTER — Other Ambulatory Visit (INDEPENDENT_AMBULATORY_CARE_PROVIDER_SITE_OTHER): Payer: Self-pay | Admitting: Orthopedic Surgery

## 2018-02-10 DIAGNOSIS — L905 Scar conditions and fibrosis of skin: Secondary | ICD-10-CM

## 2018-02-13 ENCOUNTER — Telehealth (INDEPENDENT_AMBULATORY_CARE_PROVIDER_SITE_OTHER): Payer: Self-pay | Admitting: Orthopedic Surgery

## 2018-02-13 NOTE — Telephone Encounter (Signed)
Patient called stated he wanted to cancel surgery for 02/15/18.  Please all patient to discuss per his request

## 2018-02-14 NOTE — Telephone Encounter (Signed)
I spoke with Mr. Craig Chapman.  He states that he is not having any many problems with his knee.  He does not notice any grinding and is not having as much pain.  Also states that he has a "bad feeling" about having the surgery and has decided to cancel.  Dr. Sharol Given aware.  Advised patient to call the office if he needed anything.

## 2018-02-15 ENCOUNTER — Inpatient Hospital Stay (HOSPITAL_COMMUNITY): Admission: RE | Admit: 2018-02-15 | Payer: Medicare Other | Source: Ambulatory Visit | Admitting: Orthopedic Surgery

## 2018-02-15 ENCOUNTER — Encounter (HOSPITAL_COMMUNITY): Admission: RE | Payer: Self-pay | Source: Ambulatory Visit

## 2018-02-15 SURGERY — TOTAL KNEE REVISION WITH SCAR DEBRIDEMENT/PATELLA REVISION WITH POLY EXCHANGE
Anesthesia: General | Laterality: Right

## 2018-07-20 IMAGING — DX DG KNEE COMPLETE 4+V*R*
3 series · 3 of 3 positions shown · non-contrast
Comparison: None.

CLINICAL DATA: Pain after fall.

EXAM:
RIGHT KNEE - COMPLETE 4+ VIEW

[x knee ap right]
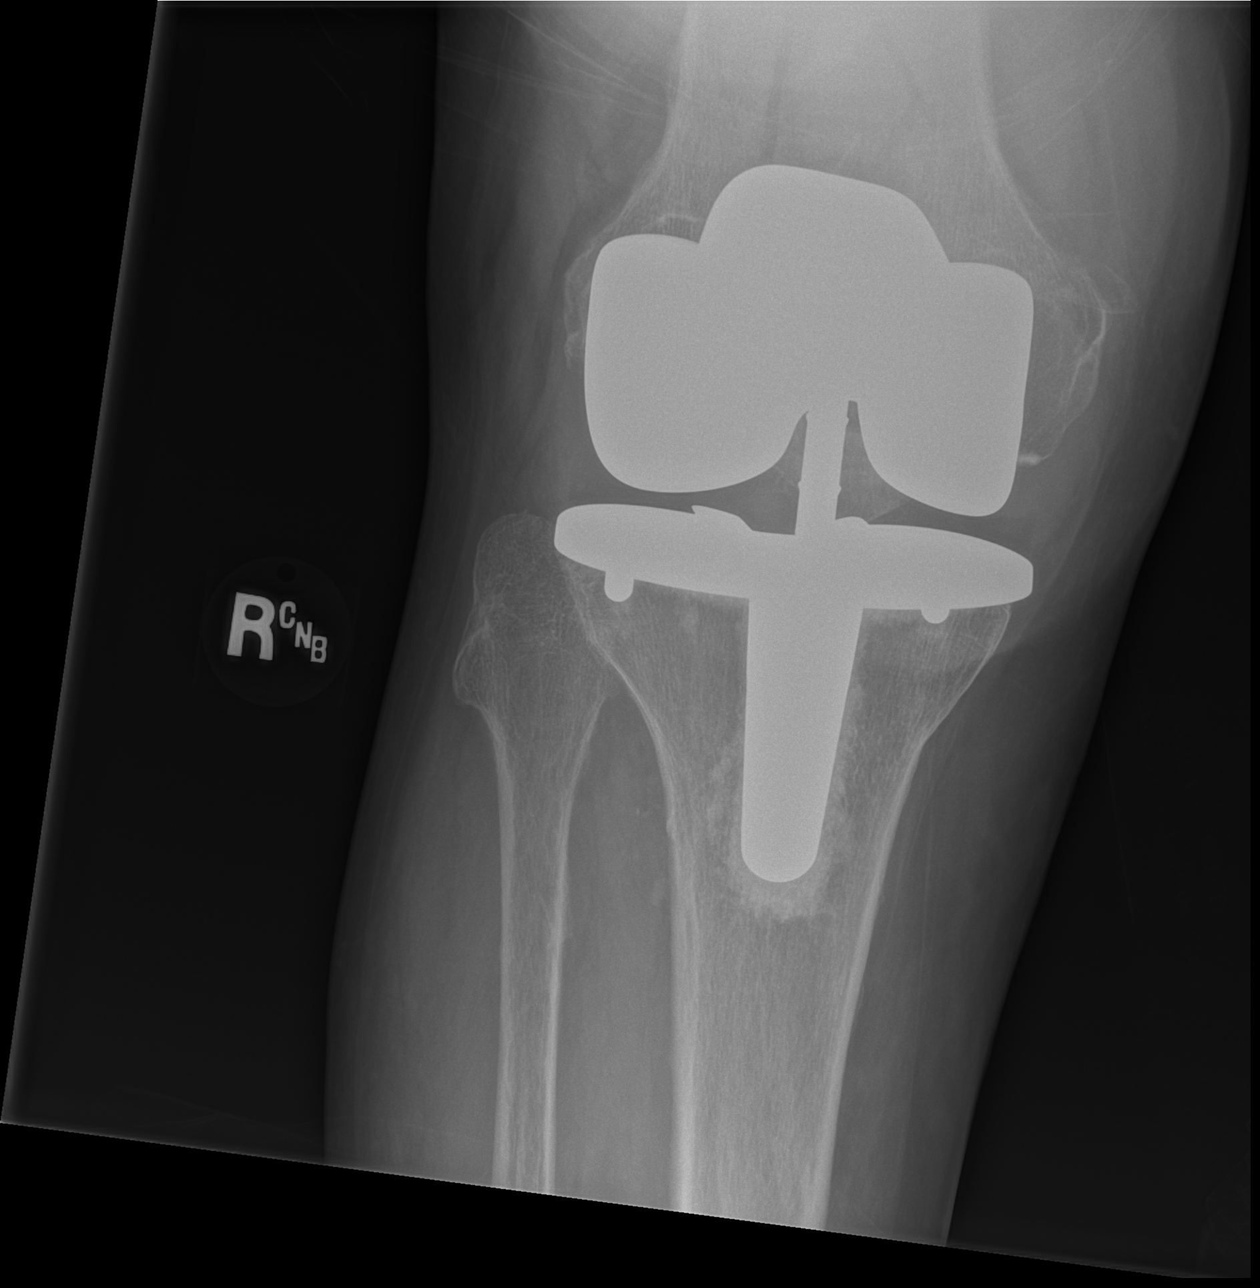

[x knee obl right (1 of 2)]
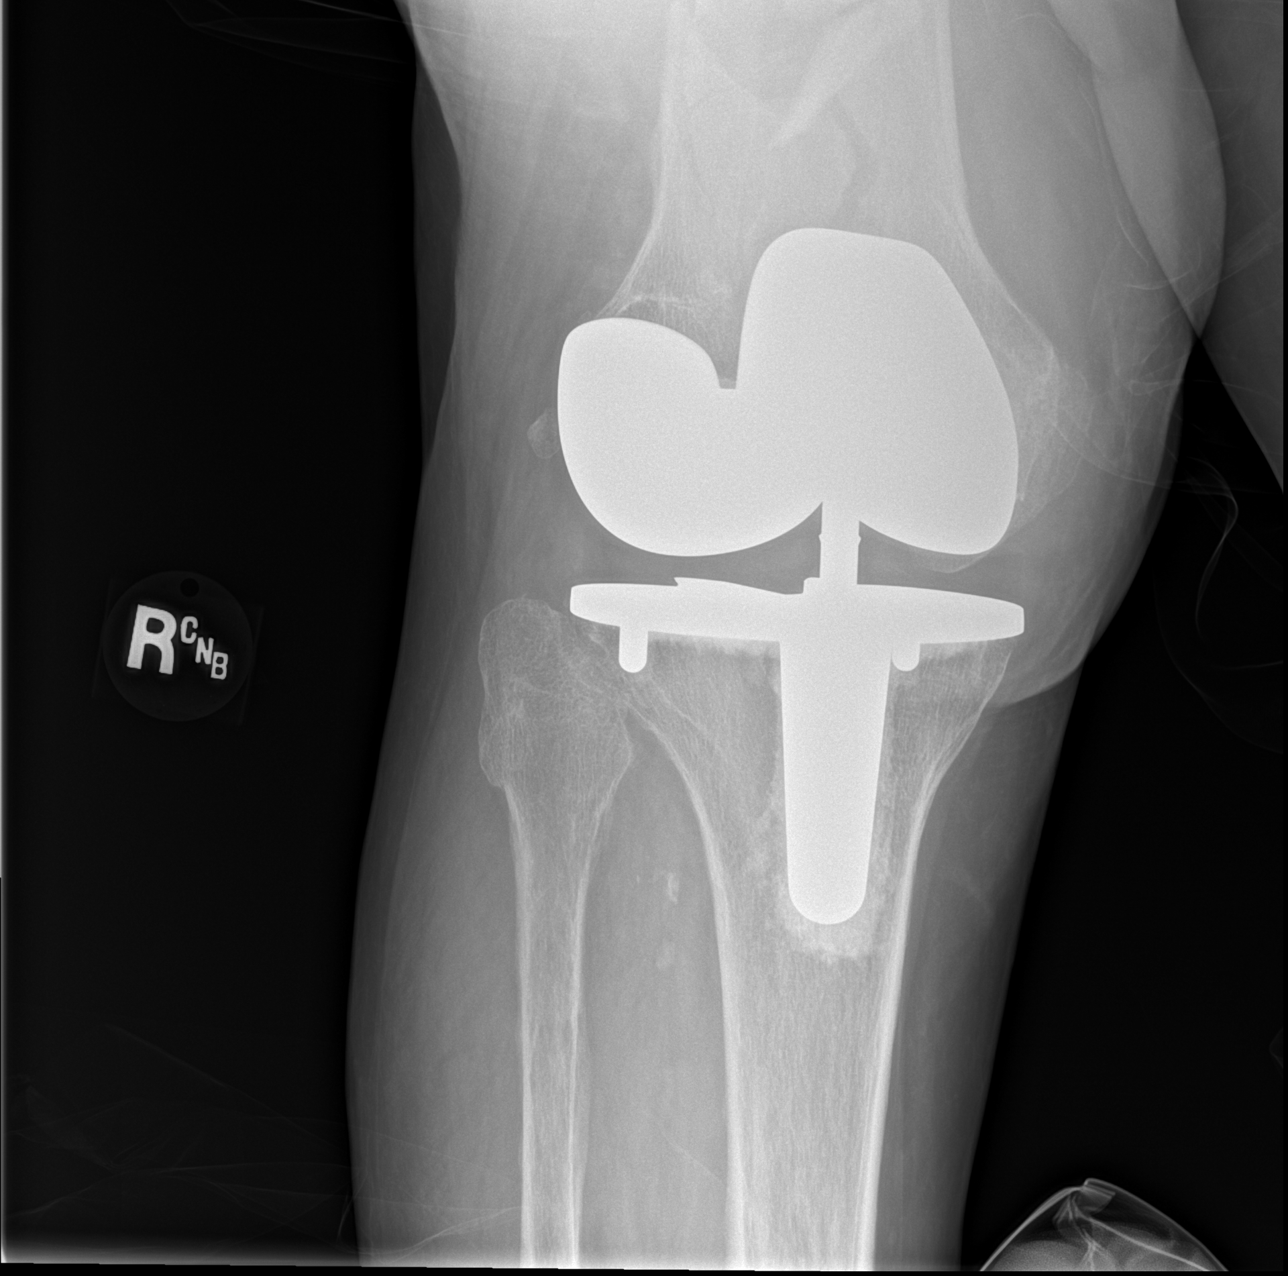

[x knee obl right (2 of 2)]
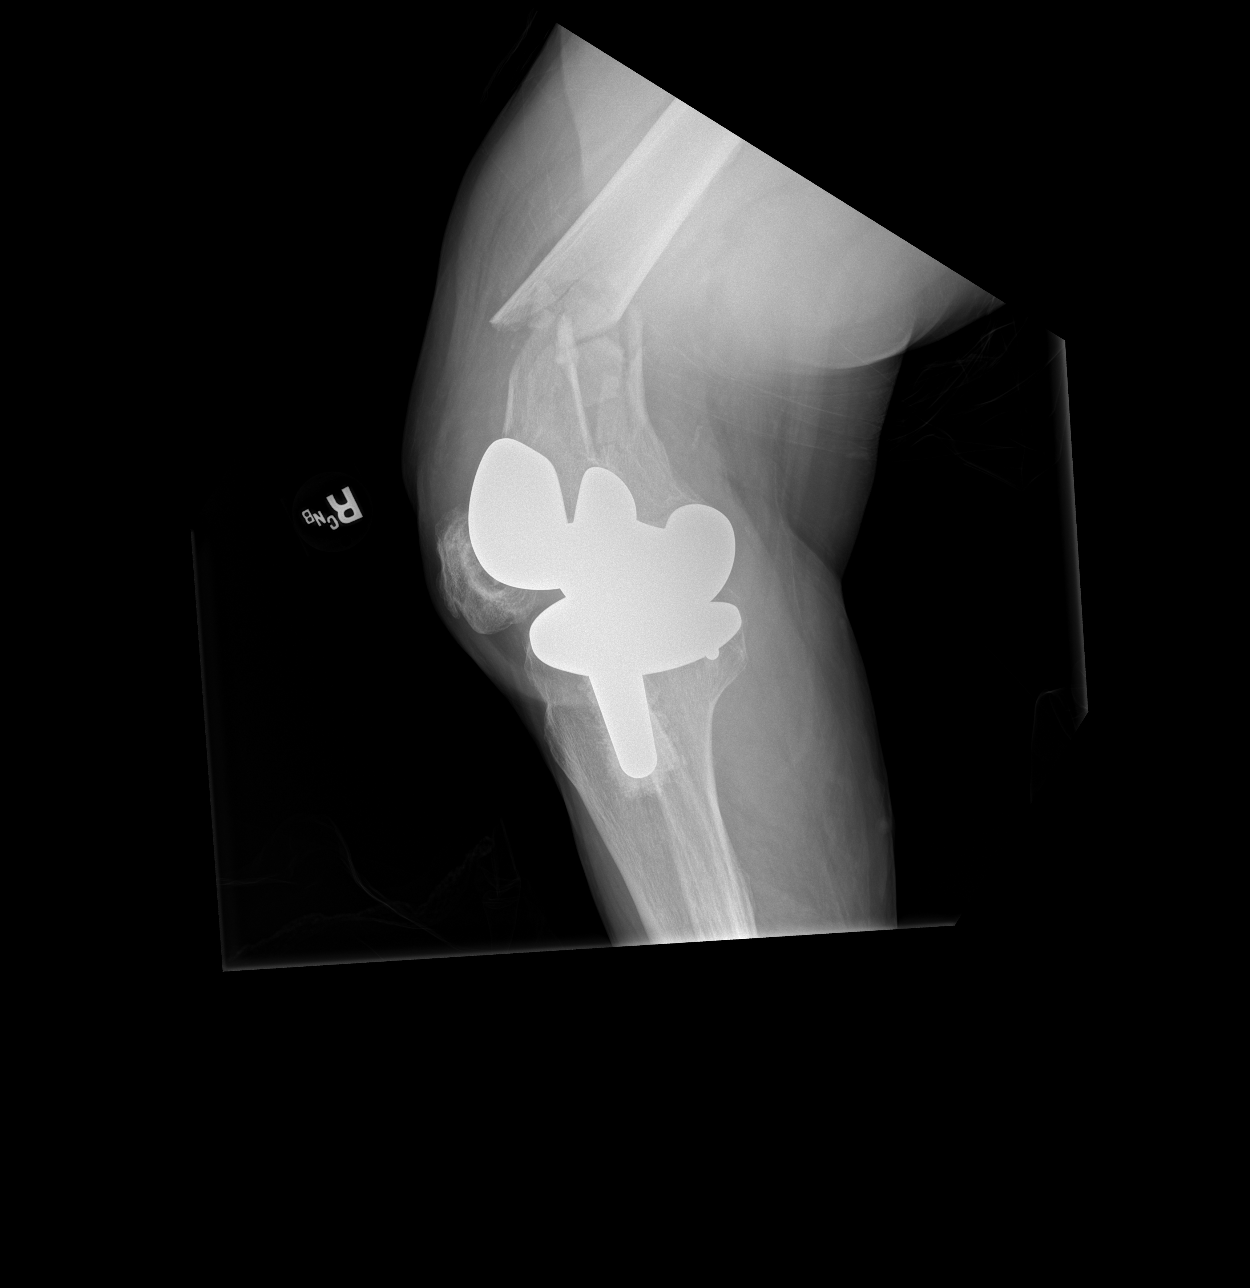

[3 of 3 positions shown; findings below may reference images not displayed]

FINDINGS: The patient is status post knee replacement. The proximal fibula and
tibia are intact. There is a comminuted displaced fracture of the
distal femur. The fracture line extends nearly to the level of the
knee replacement hardware in the distal femur. A lipoma hemarthrosis
is seen in the suprapatellar patellar space.
IMPRESSION: Comminuted displaced fracture of the distal femur extending nearly
to the level of the knee replacement hardware. Lipoma hemarthrosis.

## 2018-07-20 IMAGING — DX DG FEMUR 2+V*R*
4 series · 4 of 4 positions shown · non-contrast
Comparison: None.

CLINICAL DATA: Pain after fall

EXAM:
RIGHT FEMUR 2 VIEWS

[t femur proximal ap right]
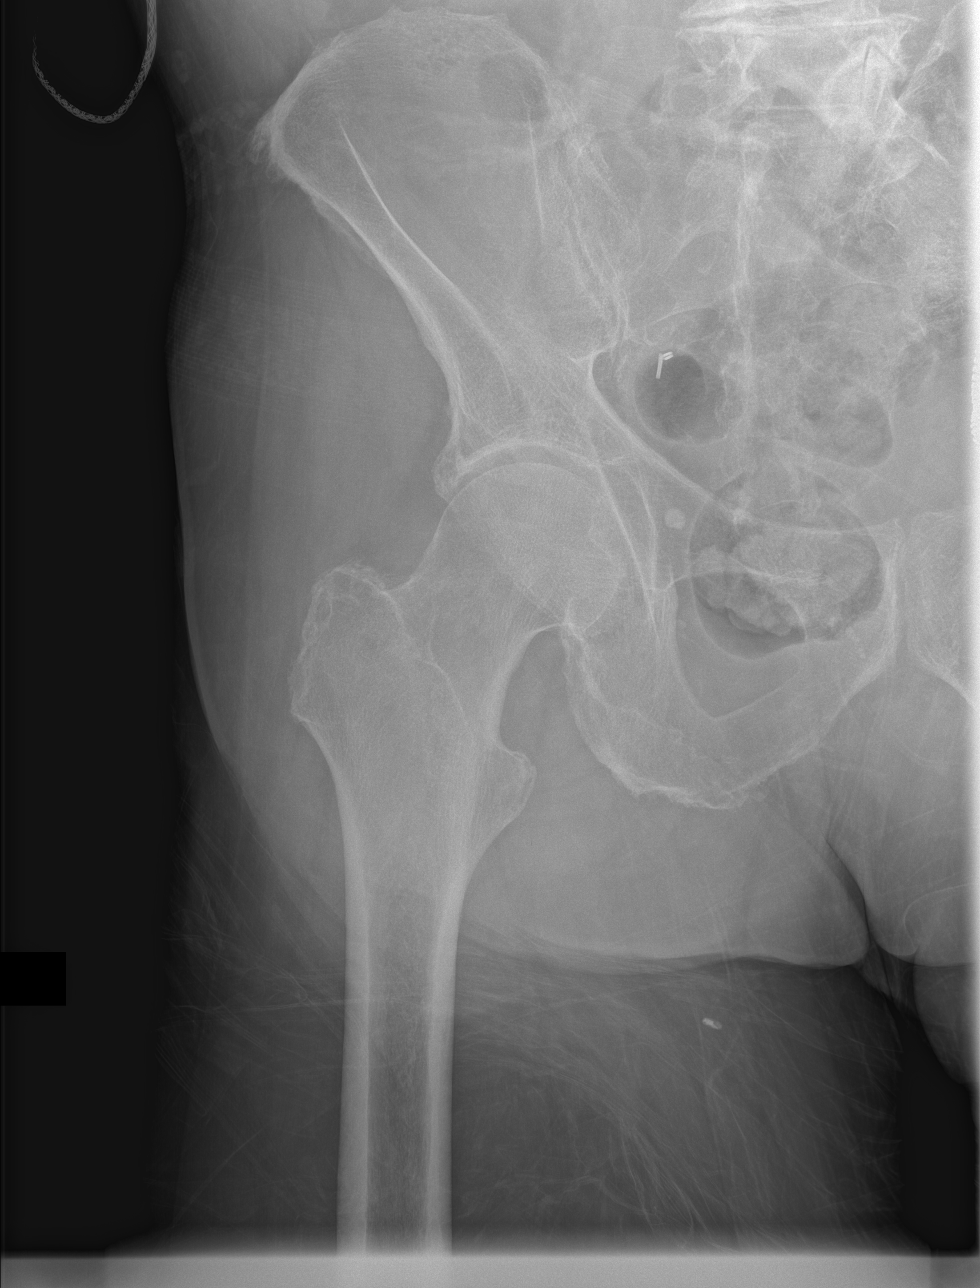

[t femur distal ap right]
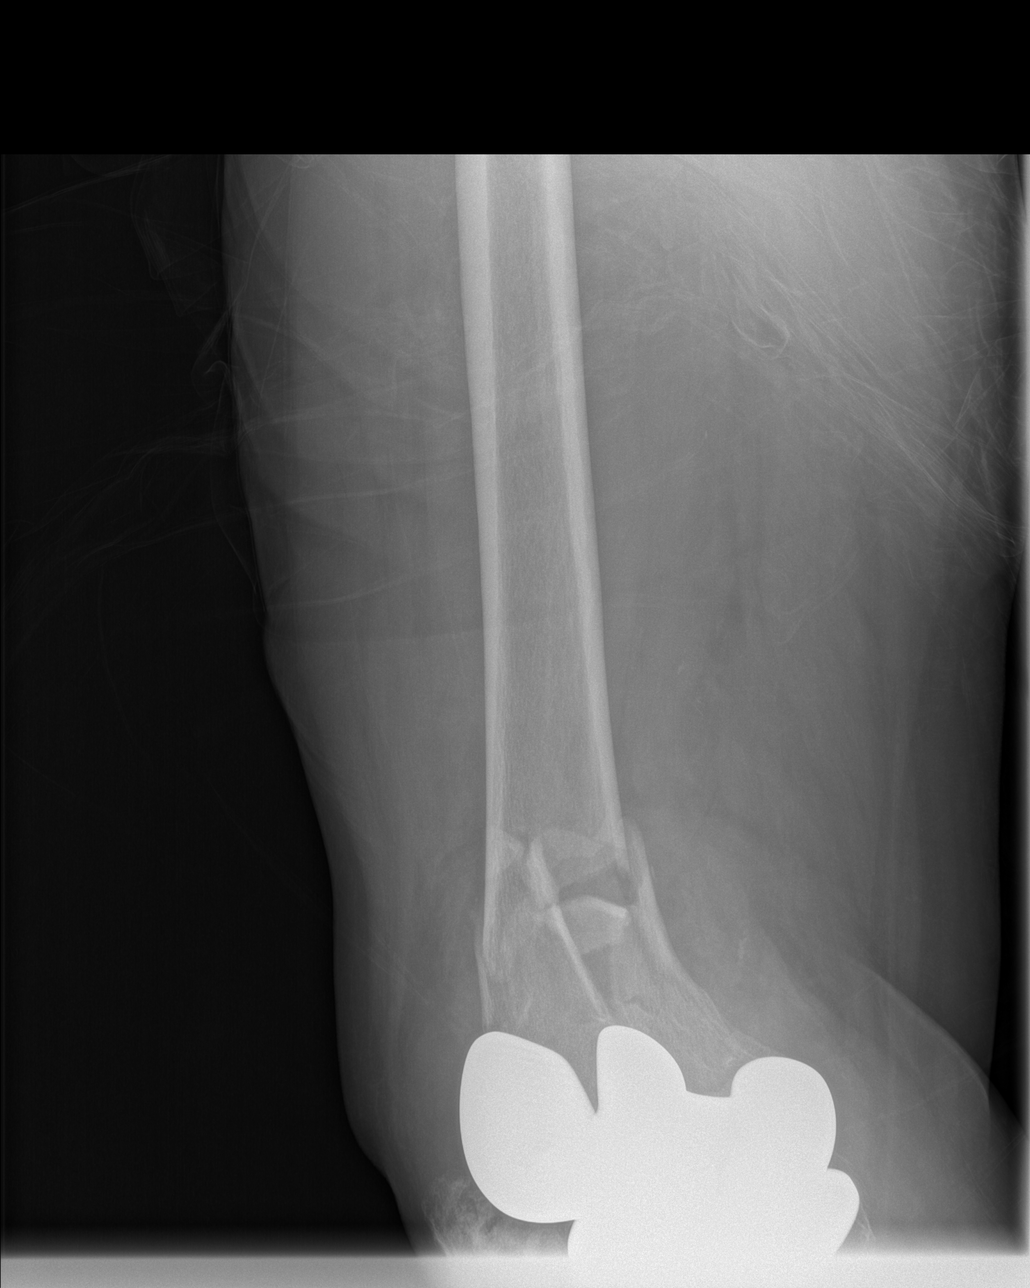

[x femur distal lat right (1 of 2)]
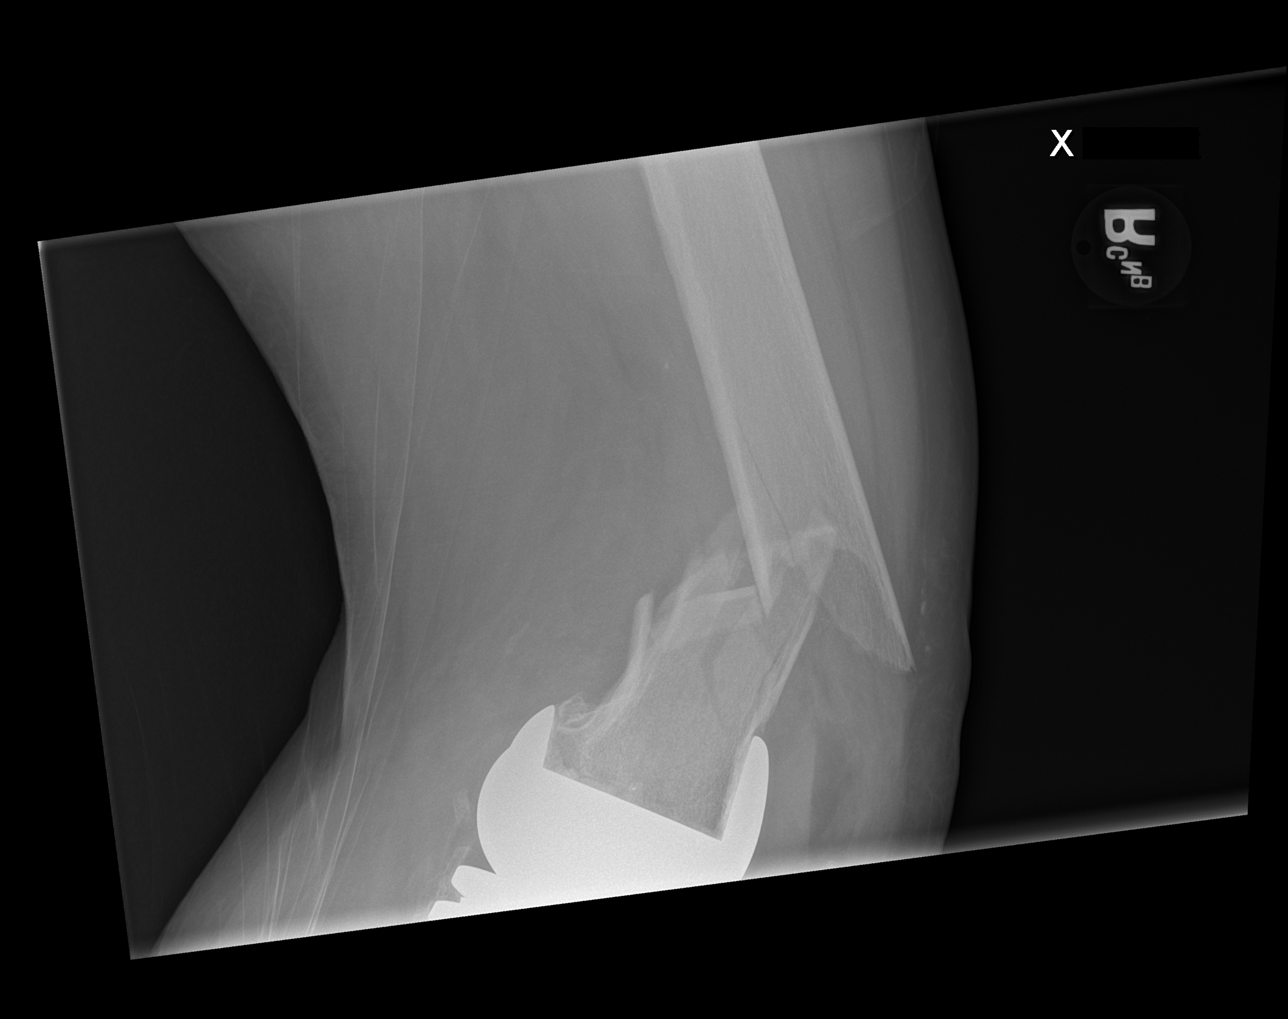

[x femur distal lat right (2 of 2)]
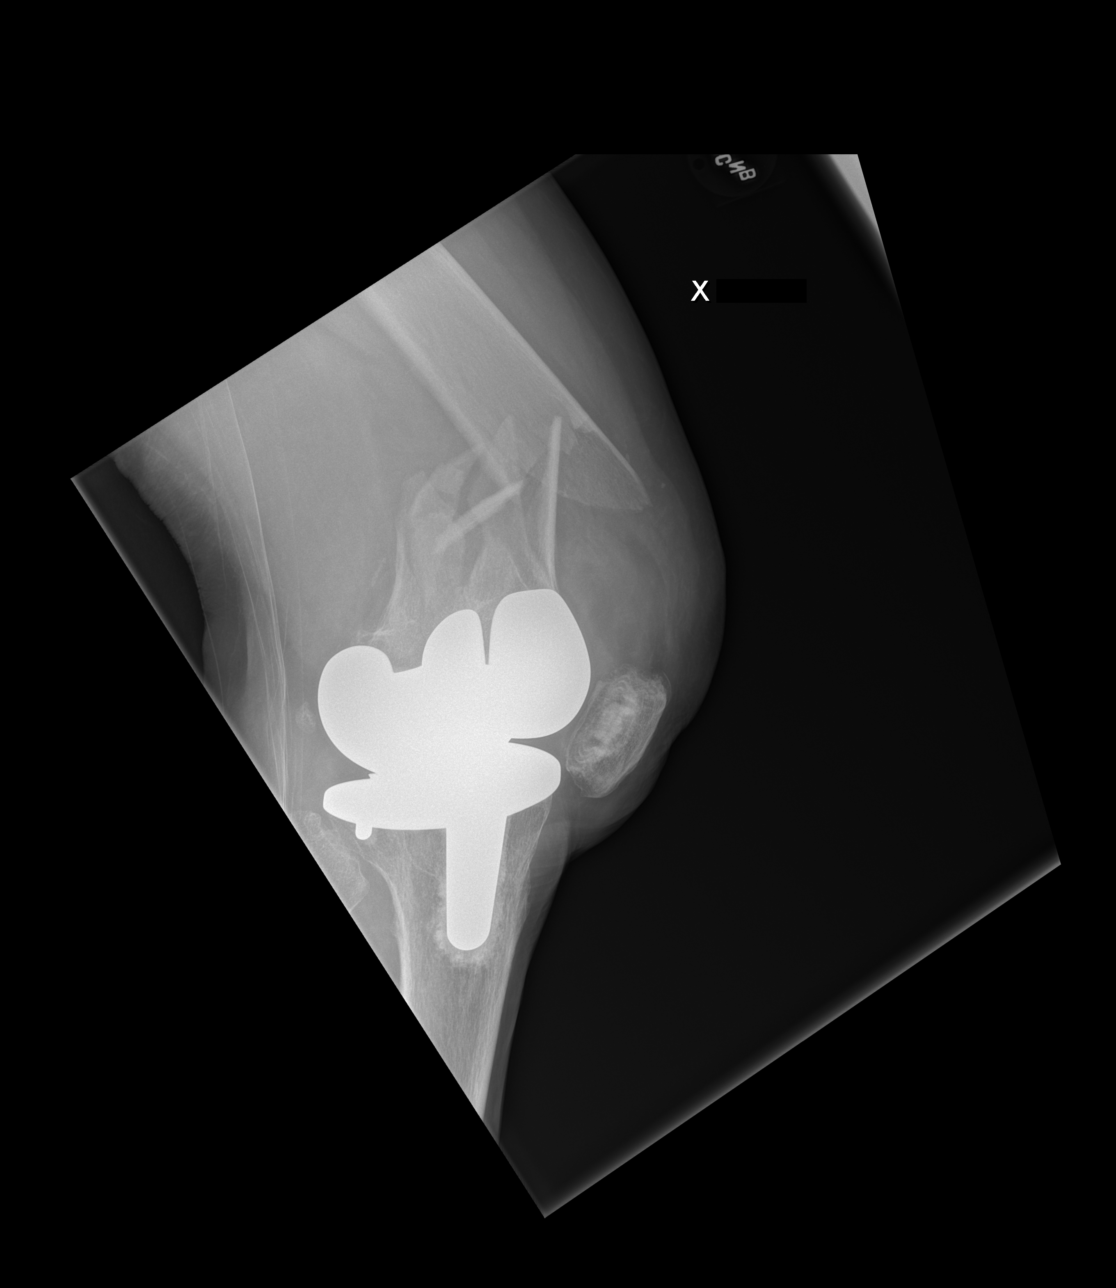

[4 of 4 positions shown; findings below may reference images not displayed]

FINDINGS: Comminuted displaced fracture of the distal femur as described on
the knee x-ray.
IMPRESSION: Comminuted displaced fracture of the distal femur as described on
the knee x-ray. The proximal femur is intact.

## 2019-08-15 ENCOUNTER — Other Ambulatory Visit: Payer: Self-pay | Admitting: Internal Medicine

## 2019-08-15 ENCOUNTER — Ambulatory Visit
Admission: RE | Admit: 2019-08-15 | Discharge: 2019-08-15 | Disposition: A | Discharge status: Home / Self Care | Payer: Medicare Other | Source: Ambulatory Visit | Attending: Internal Medicine | Admitting: Internal Medicine

## 2019-08-15 DIAGNOSIS — R519 Headache, unspecified: Secondary | ICD-10-CM

## 2019-08-16 ENCOUNTER — Emergency Department (HOSPITAL_COMMUNITY): Payer: Medicare Other

## 2019-08-16 ENCOUNTER — Other Ambulatory Visit: Payer: Self-pay

## 2019-08-16 ENCOUNTER — Observation Stay (HOSPITAL_COMMUNITY): Payer: Medicare Other

## 2019-08-16 ENCOUNTER — Encounter (HOSPITAL_COMMUNITY): Payer: Self-pay | Admitting: Emergency Medicine

## 2019-08-16 ENCOUNTER — Inpatient Hospital Stay (HOSPITAL_COMMUNITY)
Admission: EM | Admit: 2019-08-16 | Discharge: 2019-08-17 | DRG: 065 | Disposition: A | Discharge status: Home / Self Care | Payer: Medicare Other | Attending: Internal Medicine | Admitting: Internal Medicine

## 2019-08-16 ENCOUNTER — Inpatient Hospital Stay (HOSPITAL_COMMUNITY): Payer: Medicare Other

## 2019-08-16 DIAGNOSIS — I160 Hypertensive urgency: Secondary | ICD-10-CM | POA: Diagnosis present

## 2019-08-16 DIAGNOSIS — I480 Paroxysmal atrial fibrillation: Secondary | ICD-10-CM | POA: Diagnosis present

## 2019-08-16 DIAGNOSIS — Z96653 Presence of artificial knee joint, bilateral: Secondary | ICD-10-CM | POA: Diagnosis present

## 2019-08-16 DIAGNOSIS — I639 Cerebral infarction, unspecified: Principal | ICD-10-CM | POA: Diagnosis present

## 2019-08-16 DIAGNOSIS — G464 Cerebellar stroke syndrome: Secondary | ICD-10-CM | POA: Diagnosis present

## 2019-08-16 DIAGNOSIS — R059 Cough, unspecified: Secondary | ICD-10-CM

## 2019-08-16 DIAGNOSIS — Z79899 Other long term (current) drug therapy: Secondary | ICD-10-CM

## 2019-08-16 DIAGNOSIS — I6521 Occlusion and stenosis of right carotid artery: Secondary | ICD-10-CM | POA: Diagnosis present

## 2019-08-16 DIAGNOSIS — I10 Essential (primary) hypertension: Secondary | ICD-10-CM | POA: Diagnosis present

## 2019-08-16 DIAGNOSIS — R05 Cough: Secondary | ICD-10-CM

## 2019-08-16 DIAGNOSIS — Z882 Allergy status to sulfonamides status: Secondary | ICD-10-CM

## 2019-08-16 DIAGNOSIS — G459 Transient cerebral ischemic attack, unspecified: Secondary | ICD-10-CM

## 2019-08-16 DIAGNOSIS — R1310 Dysphagia, unspecified: Secondary | ICD-10-CM | POA: Diagnosis present

## 2019-08-16 DIAGNOSIS — Z20828 Contact with and (suspected) exposure to other viral communicable diseases: Secondary | ICD-10-CM | POA: Diagnosis present

## 2019-08-16 DIAGNOSIS — R471 Dysarthria and anarthria: Secondary | ICD-10-CM | POA: Diagnosis present

## 2019-08-16 DIAGNOSIS — R490 Dysphonia: Secondary | ICD-10-CM | POA: Diagnosis present

## 2019-08-16 DIAGNOSIS — I739 Peripheral vascular disease, unspecified: Secondary | ICD-10-CM | POA: Diagnosis present

## 2019-08-16 DIAGNOSIS — I340 Nonrheumatic mitral (valve) insufficiency: Secondary | ICD-10-CM

## 2019-08-16 DIAGNOSIS — D649 Anemia, unspecified: Secondary | ICD-10-CM | POA: Diagnosis present

## 2019-08-16 DIAGNOSIS — R531 Weakness: Secondary | ICD-10-CM | POA: Diagnosis present

## 2019-08-16 DIAGNOSIS — Z85118 Personal history of other malignant neoplasm of bronchus and lung: Secondary | ICD-10-CM

## 2019-08-16 DIAGNOSIS — E785 Hyperlipidemia, unspecified: Secondary | ICD-10-CM | POA: Diagnosis present

## 2019-08-16 DIAGNOSIS — Z791 Long term (current) use of non-steroidal anti-inflammatories (NSAID): Secondary | ICD-10-CM

## 2019-08-16 DIAGNOSIS — R29810 Facial weakness: Secondary | ICD-10-CM | POA: Diagnosis present

## 2019-08-16 DIAGNOSIS — Z6829 Body mass index (BMI) 29.0-29.9, adult: Secondary | ICD-10-CM | POA: Diagnosis not present

## 2019-08-16 DIAGNOSIS — Z902 Acquired absence of lung [part of]: Secondary | ICD-10-CM

## 2019-08-16 DIAGNOSIS — Z7982 Long term (current) use of aspirin: Secondary | ICD-10-CM | POA: Diagnosis not present

## 2019-08-16 DIAGNOSIS — R4701 Aphasia: Secondary | ICD-10-CM | POA: Diagnosis present

## 2019-08-16 DIAGNOSIS — I350 Nonrheumatic aortic (valve) stenosis: Secondary | ICD-10-CM

## 2019-08-16 DIAGNOSIS — H9190 Unspecified hearing loss, unspecified ear: Secondary | ICD-10-CM | POA: Diagnosis present

## 2019-08-16 DIAGNOSIS — Z9049 Acquired absence of other specified parts of digestive tract: Secondary | ICD-10-CM | POA: Diagnosis not present

## 2019-08-16 DIAGNOSIS — E663 Overweight: Secondary | ICD-10-CM | POA: Diagnosis present

## 2019-08-16 DIAGNOSIS — I69354 Hemiplegia and hemiparesis following cerebral infarction affecting left non-dominant side: Secondary | ICD-10-CM | POA: Diagnosis not present

## 2019-08-16 DIAGNOSIS — Z8572 Personal history of non-Hodgkin lymphomas: Secondary | ICD-10-CM

## 2019-08-16 LAB — ECHOCARDIOGRAM COMPLETE
Height: 72 in
Weight: 3439.18 oz

## 2019-08-16 LAB — LIPID PANEL
Cholesterol: 174 mg/dL (ref 0–200)
HDL: 38 mg/dL — ABNORMAL LOW (ref >40)
LDL Cholesterol: 115 mg/dL — ABNORMAL HIGH (ref 0–99)
Total CHOL/HDL Ratio: 4.6 RATIO
Triglycerides: 107 mg/dL (ref <150)
VLDL: 21 mg/dL (ref 0–40)

## 2019-08-16 LAB — I-STAT CHEM 8, ED
BUN: 18 mg/dL (ref 8–23)
Calcium, Ion: 1.08 mmol/L — ABNORMAL LOW (ref 1.15–1.40)
Chloride: 101 mmol/L (ref 98–111)
Creatinine, Ser: 1 mg/dL (ref 0.61–1.24)
Glucose, Bld: 142 mg/dL — ABNORMAL HIGH (ref 70–99)
HCT: 38 % — ABNORMAL LOW (ref 39.0–52.0)
Hemoglobin: 12.9 g/dL — ABNORMAL LOW (ref 13.0–17.0)
Potassium: 3.6 mmol/L (ref 3.5–5.1)
Sodium: 137 mmol/L (ref 135–145)
TCO2: 23 mmol/L (ref 22–32)

## 2019-08-16 LAB — URINALYSIS, ROUTINE W REFLEX MICROSCOPIC
Bilirubin Urine: NEGATIVE
Glucose, UA: NEGATIVE mg/dL
Hgb urine dipstick: NEGATIVE
Ketones, ur: NEGATIVE mg/dL
Leukocytes,Ua: NEGATIVE
Nitrite: NEGATIVE
Protein, ur: NEGATIVE mg/dL
Specific Gravity, Urine: 1.021 (ref 1.005–1.030)
pH: 5 (ref 5.0–8.0)

## 2019-08-16 LAB — CBC
HCT: 37.8 % — ABNORMAL LOW (ref 39.0–52.0)
HCT: 38.8 % — ABNORMAL LOW (ref 39.0–52.0)
Hemoglobin: 12.6 g/dL — ABNORMAL LOW (ref 13.0–17.0)
Hemoglobin: 12.8 g/dL — ABNORMAL LOW (ref 13.0–17.0)
MCH: 32.8 pg (ref 26.0–34.0)
MCH: 33.2 pg (ref 26.0–34.0)
MCHC: 33 g/dL (ref 30.0–36.0)
MCHC: 33.3 g/dL (ref 30.0–36.0)
MCV: 99.5 fL (ref 80.0–100.0)
MCV: 99.7 fL (ref 80.0–100.0)
Platelets: 163 10*3/uL (ref 150–400)
Platelets: 172 10*3/uL (ref 150–400)
RBC: 3.79 MIL/uL — ABNORMAL LOW (ref 4.22–5.81)
RBC: 3.9 MIL/uL — ABNORMAL LOW (ref 4.22–5.81)
RDW: 13.5 % (ref 11.5–15.5)
RDW: 13.6 % (ref 11.5–15.5)
WBC: 5.9 10*3/uL (ref 4.0–10.5)
WBC: 7.1 10*3/uL (ref 4.0–10.5)
nRBC: 0 % (ref 0.0–0.2)
nRBC: 0 % (ref 0.0–0.2)

## 2019-08-16 LAB — RAPID URINE DRUG SCREEN, HOSP PERFORMED
Amphetamines: NOT DETECTED
Barbiturates: NOT DETECTED
Benzodiazepines: NOT DETECTED
Cocaine: NOT DETECTED
Opiates: NOT DETECTED
Tetrahydrocannabinol: NOT DETECTED

## 2019-08-16 LAB — COMPREHENSIVE METABOLIC PANEL
ALT: 37 U/L (ref 0–44)
AST: 51 U/L — ABNORMAL HIGH (ref 15–41)
Albumin: 3.6 g/dL (ref 3.5–5.0)
Alkaline Phosphatase: 50 U/L (ref 38–126)
Anion gap: 8 (ref 5–15)
BUN: 17 mg/dL (ref 8–23)
CO2: 25 mmol/L (ref 22–32)
Calcium: 8.9 mg/dL (ref 8.9–10.3)
Chloride: 103 mmol/L (ref 98–111)
Creatinine, Ser: 1.1 mg/dL (ref 0.61–1.24)
GFR calc Af Amer: >60 mL/min (ref >60)
GFR calc non Af Amer: 59 mL/min — ABNORMAL LOW (ref >60)
Glucose, Bld: 151 mg/dL — ABNORMAL HIGH (ref 70–99)
Potassium: 3.7 mmol/L (ref 3.5–5.1)
Sodium: 136 mmol/L (ref 135–145)
Total Bilirubin: 0.6 mg/dL (ref 0.3–1.2)
Total Protein: 7.3 g/dL (ref 6.5–8.1)

## 2019-08-16 LAB — CREATININE, SERUM
Creatinine, Ser: 0.95 mg/dL (ref 0.61–1.24)
GFR calc Af Amer: >60 mL/min (ref >60)
GFR calc non Af Amer: >60 mL/min (ref >60)

## 2019-08-16 LAB — DIFFERENTIAL
Abs Immature Granulocytes: 0.03 10*3/uL (ref 0.00–0.07)
Basophils Absolute: 0 10*3/uL (ref 0.0–0.1)
Basophils Relative: 0 %
Eosinophils Absolute: 0.1 10*3/uL (ref 0.0–0.5)
Eosinophils Relative: 1 %
Immature Granulocytes: 0 %
Lymphocytes Relative: 29 %
Lymphs Abs: 2 10*3/uL (ref 0.7–4.0)
Monocytes Absolute: 0.6 10*3/uL (ref 0.1–1.0)
Monocytes Relative: 8 %
Neutro Abs: 4.4 10*3/uL (ref 1.7–7.7)
Neutrophils Relative %: 62 %

## 2019-08-16 LAB — CBG MONITORING, ED: Glucose-Capillary: 142 mg/dL — ABNORMAL HIGH (ref 70–99)

## 2019-08-16 LAB — HEMOGLOBIN A1C
Hgb A1c MFr Bld: 5.4 % (ref 4.8–5.6)
Mean Plasma Glucose: 108.28 mg/dL

## 2019-08-16 LAB — PROTIME-INR
INR: 1.2 (ref 0.8–1.2)
Prothrombin Time: 15.2 seconds (ref 11.4–15.2)

## 2019-08-16 LAB — ETHANOL: Alcohol, Ethyl (B): <10 mg/dL (ref <10)

## 2019-08-16 LAB — APTT: aPTT: 30 seconds (ref 24–36)

## 2019-08-16 LAB — SARS CORONAVIRUS 2 (TAT 6-24 HRS): SARS Coronavirus 2: NEGATIVE

## 2019-08-16 LAB — MAGNESIUM: Magnesium: 2.2 mg/dL (ref 1.7–2.4)

## 2019-08-16 MED ORDER — ASPIRIN EC 325 MG PO TBEC
325.0000 mg | DELAYED_RELEASE_TABLET | Freq: Once | ORAL | Status: AC
  Administered 2019-08-16: 325 mg via ORAL
  Filled 2019-08-16: qty 1

## 2019-08-16 MED ORDER — ASPIRIN 300 MG RE SUPP: 300.0000 mg | Freq: Every day | RECTAL | Status: DC

## 2019-08-16 MED ORDER — SODIUM CHLORIDE 0.9 % IV SOLN
INTRAVENOUS | Status: DC
  Administered 2019-08-16: 07:00:00 via INTRAVENOUS

## 2019-08-16 MED ORDER — ACETAMINOPHEN 325 MG PO TABS: 650.0000 mg | ORAL_TABLET | ORAL | Status: DC | PRN

## 2019-08-16 MED ORDER — CLOPIDOGREL BISULFATE 75 MG PO TABS
75.0000 mg | ORAL_TABLET | Freq: Every day | ORAL | Status: DC
  Administered 2019-08-16 – 2019-08-17 (×2): 75 mg via ORAL
  Filled 2019-08-16 (×2): qty 1

## 2019-08-16 MED ORDER — ENOXAPARIN SODIUM 40 MG/0.4ML ~~LOC~~ SOLN
40.0000 mg | Freq: Every day | SUBCUTANEOUS | Status: DC
  Administered 2019-08-16 – 2019-08-17 (×2): 40 mg via SUBCUTANEOUS
  Filled 2019-08-16 (×2): qty 0.4

## 2019-08-16 MED ORDER — ASPIRIN EC 325 MG PO TBEC
650.0000 mg | DELAYED_RELEASE_TABLET | Freq: Once | ORAL | Status: AC
  Administered 2019-08-16: 02:00:00 650 mg via ORAL
  Filled 2019-08-16: qty 2

## 2019-08-16 MED ORDER — ACETAMINOPHEN 160 MG/5ML PO SOLN: 650.0000 mg | ORAL | Status: DC | PRN

## 2019-08-16 MED ORDER — ACETAMINOPHEN 650 MG RE SUPP: 650.0000 mg | RECTAL | Status: DC | PRN

## 2019-08-16 MED ORDER — ATORVASTATIN CALCIUM 40 MG PO TABS
40.0000 mg | ORAL_TABLET | Freq: Every day | ORAL | Status: DC
  Administered 2019-08-16: 22:00:00 40 mg via ORAL
  Filled 2019-08-16: qty 1

## 2019-08-16 MED ORDER — STROKE: EARLY STAGES OF RECOVERY BOOK
Freq: Once | Status: AC
  Administered 2019-08-16: 22:00:00
  Filled 2019-08-16: qty 1

## 2019-08-16 MED ORDER — ASPIRIN 325 MG PO TABS: 325.0000 mg | ORAL_TABLET | Freq: Every day | ORAL | Status: DC

## 2019-08-16 MED ORDER — ASPIRIN 81 MG PO CHEW: 81.0000 mg | CHEWABLE_TABLET | Freq: Every day | ORAL | Status: DC

## 2019-08-16 MED ORDER — POTASSIUM CHLORIDE CRYS ER 20 MEQ PO TBCR
40.0000 meq | EXTENDED_RELEASE_TABLET | Freq: Once | ORAL | Status: AC
  Administered 2019-08-16: 13:00:00 40 meq via ORAL
  Filled 2019-08-16: qty 2

## 2019-08-16 MED ORDER — SODIUM CHLORIDE 0.9 % IV SOLN
INTRAVENOUS | Status: DC
  Administered 2019-08-16: 13:00:00 via INTRAVENOUS

## 2019-08-16 NOTE — ED Provider Notes (Signed)
TIME SEEN: 12:27 AM  CHIEF COMPLAINT: Code stroke  HPI: Patient is an 85 year old male with history of hypertension, atrial fibrillation, TIA, lymphoma, lung cancer status post pneumonectomy who presents to the emergency department with EMS as a code stroke.  Last seen normal around 2 PM.  Developed symptoms of unsteady gait and walking to the left, left-sided weakness, left-sided facial droop, dysarthria and expressive aphasia.  EMS reports blood pressure 200/100.  Blood glucose 137.  Per EMS patient is not taking any medication.  Patient feels like his speech is improving slowly.  Denies numbness or weakness currently. Lives with wife.  Per EMS, patient vomited in route.  Shortly after that he became bradycardic into the 30s and had a near syncopal event.  EMS suspected it was a vasovagal reaction.  Patient not having chest pain or shortness of breath.  Given 4 mg of IV Zofran in route.   Patient was seen yesterday by his PCP Dr. Deforest Hoyles for complaints of a left-sided headache.  Had a noncontrast head CT as an outpatient which showed no acute abnormality.  Patient denies any pain currently.  Daughter - Andree Elk [phone]; Home: [phone]  ROS: Level 5 caveat secondary to acuity  PAST MEDICAL HISTORY/PAST SURGICAL HISTORY:  Past Medical History:  Diagnosis Date  . Arthritis   . Atrial fibrillation ([place])   . History of lung or bronchial cancer   . Hypertension   . Lymphoma ([place])   . TIA (transient ischemic attack)     MEDICATIONS:  Prior to Admission medications   Medication Sig Start Date End Date Taking? Authorizing Provider  acetaminophen (TYLENOL) 500 MG tablet Take 1 tablet (500 mg total) by mouth every 6 (six) hours as needed for mild pain. Patient not taking: Reported on 02/08/2018 05/15/17   Newt Minion, MD  aspirin EC 81 MG tablet Take 1 tablet (81 mg total) by mouth daily. Patient not taking: Reported on 02/08/2018 05/15/17   Newt Minion, MD  docusate sodium  (COLACE) 100 MG capsule Take 1 capsule (100 mg total) by mouth 2 (two) times daily. Patient not taking: Reported on 02/08/2018 05/17/17   Rai, Vernelle Emerald, MD  ibuprofen (ADVIL,MOTRIN) 200 MG tablet Take 200 mg by mouth every 6 (six) hours as needed for headache or moderate pain.    [provider]    ALLERGIES:  Allergies  Allergen Reactions  . Sulfa Antibiotics Hives    SOCIAL HISTORY:  Social History   Tobacco Use  . Smoking status: Former Research scientist (life sciences)  . Smokeless tobacco: Never Used  Substance Use Topics  . Alcohol use: No    FAMILY HISTORY: No family history on file.  EXAM: BP (!) 158/78   Pulse 69   Temp 98.3 F (36.8 C)   Resp (!) 22   Ht 6' (1.829 m)   Wt 97.5 kg   SpO2 94%   BMI 29.15 kg/m  CONSTITUTIONAL: Alert and oriented x 3 and responds appropriately to questions.  Elderly, no distress HEAD: Normocephalic EYES: Conjunctivae clear, pupils appear equal, EOMI ENT: normal nose; moist mucous membranes NECK: Supple, no meningismus, no nuchal rigidity, no LAD  CARD: RRR; S1 and S2 appreciated; no murmurs, no clicks, no rubs, no gallops RESP: Normal chest excursion without splinting or tachypnea; breath sounds clear and equal bilaterally; no wheezes, no rhonchi, no rales, no hypoxia or respiratory distress, speaking full sentences ABD/GI: Normal bowel sounds; non-distended; soft, non-tender, no rebound, no guarding, no peritoneal signs, no hepatosplenomegaly BACK:  The back appears normal and is non-tender to palpation, there is no CVA tenderness EXT: Normal ROM in all joints; non-tender to palpation; no edema; normal capillary refill; no cyanosis, no calf tenderness or swelling    SKIN: Normal color for age and race; warm; no rash NEURO: Mild dysarthria.  No aphasia.  Cranial nerves II through XII intact.  No facial droop currently.  No drift or weakness noted.  No numbness.  NIH stroke scale 1. PSYCH: The patient's mood and manner are appropriate. Grooming and  personal hygiene are appropriate.  MEDICAL DECISION MAKING: Patient here as a code stroke.  Dr. Cheral Marker at bedside with neurology.  Appreciate his help.  CT scan shows no acute abnormality.  NIH stroke scale is 1.  Patient is LVO negative.  Not a TPA candidate at this time given low NIH stroke scale, improving symptoms and last seen normal 11 hours ago.  Neurology recommends MRI and MRA of the brain.  Concerned for TIA.  Recommends medical admission.  Will discuss with hospitalist.  PCP is Dr. Lavone Orn with [facility].  Updated patient's daughter by phone.  She is on her way to the emergency department.  ED PROGRESS: 1:14 AM Discussed patient's case with hospitalist, Dr. Hal Hope.  I have recommended admission and patient (and family if present) agree with this plan. Admitting physician will place admission orders.   I reviewed all nursing notes, vitals, pertinent previous records, EKGs, lab and urine results, imaging (as available).      EKG Interpretation  Date/Time:  Thursday August 16 2019 00:51:54 EDT Ventricular Rate:  68 PR Interval:    QRS Duration: 104 QT Interval:  486 QTC Calculation: 517 R Axis:   17 Text Interpretation:  Sinus rhythm Prolonged PR interval Nonspecific T abnormalities, lateral leads Prolonged QT interval No significant change since last tracing in 2018 Confirmed by Burley Kopka, Cyril Mourning [phone]) on 08/16/2019 1:03:40 AM        Lysle Morales was evaluated in Emergency Department on 08/16/2019 for the symptoms described in the history of present illness. He was evaluated in the context of the global COVID-19 pandemic, which necessitated consideration that the patient might be at risk for infection with the SARS-CoV-2 virus that causes COVID-19. Institutional protocols and algorithms that pertain to the evaluation of patients at risk for COVID-19 are in a state of rapid change based on information released by regulatory bodies including the CDC and federal and  state organizations. These policies and algorithms were followed during the patient's care in the ED.    Orie Baxendale, Delice Bison, DO 08/16/19 [phone]

## 2019-08-16 NOTE — ED Notes (Signed)
Report given to Juliette Mangle, transported by [employer]

## 2019-08-16 NOTE — Progress Notes (Signed)
STROKE TEAM PROGRESS NOTE   INTERVAL HISTORY Craig Chapman at the bedside.  Patient sitting in bed, having lunch.  RN noted coughing with meat.  Recommend speech evaluation.  Patient hard of hearing, however, minimal symptoms at this time.  Craig Chapman denies any atrial fibrillation history.  Vitals:   08/16/19 1200 08/16/19 1230 08/16/19 1313 08/16/19 1400  BP: (!) 152/89 (!) 143/59 (!) 143/59 (!) 162/80  Pulse:      Resp: 18 15 17  (!) 21  Temp:      SpO2:   96%   Weight:      Height:        CBC:  Recent Labs  Lab 08/16/19 0028 08/16/19 0035 08/16/19 0505  WBC 7.1  --  5.9  NEUTROABS 4.4  --   --   HGB 12.8* 12.9* 12.6*  HCT 38.8* 38.0* 37.8*  MCV 99.5  --  99.7  PLT 172  --  [id number]    Basic Metabolic Panel:  Recent Labs  Lab 08/16/19 0028 08/16/19 0035 08/16/19 0505 08/16/19 0506  NA 136 137  --   --   K 3.7 3.6  --   --   CL 103 101  --   --   CO2 25  --   --   --   GLUCOSE 151* 142*  --   --   BUN 17 18  --   --   CREATININE 1.10 1.00 0.95  --   CALCIUM 8.9  --   --   --   MG  --   --   --  2.2   Lipid Panel:     Component Value Date/Time   CHOL 174 08/16/2019 0506   TRIG 107 08/16/2019 0506   HDL 38 (L) 08/16/2019 0506   CHOLHDL 4.6 08/16/2019 0506   VLDL 21 08/16/2019 0506   LDLCALC 115 (H) 08/16/2019 0506   HgbA1c:  Lab Results  Component Value Date   HGBA1C 5.4 08/16/2019   Urine Drug Screen:     Component Value Date/Time   LABOPIA NONE DETECTED 08/16/2019 0028   COCAINSCRNUR NONE DETECTED 08/16/2019 0028   LABBENZ NONE DETECTED 08/16/2019 0028   AMPHETMU NONE DETECTED 08/16/2019 0028   THCU NONE DETECTED 08/16/2019 0028   LABBARB NONE DETECTED 08/16/2019 0028    Alcohol Level     Component Value Date/Time   ETH <10 08/16/2019 0028    IMAGING Ct Head Wo Contrast  Result Date: 08/15/2019 CLINICAL DATA:  History of TIAs, headache EXAM: CT HEAD WITHOUT CONTRAST TECHNIQUE: Contiguous axial images were obtained from the base of the skull through the  vertex without intravenous contrast. COMPARISON:  08/27/2009 FINDINGS: Brain: No evidence of acute infarction, hemorrhage, extra-axial collection, ventriculomegaly, or mass effect. Old right basal ganglia lacunar infarct. Generalized cerebral atrophy. Periventricular white matter low attenuation likely secondary to microangiopathy. Vascular: Cerebrovascular atherosclerotic calcifications are noted. Skull: Negative for fracture or focal lesion. Sinuses/Orbits: Visualized portions of the orbits are unremarkable. Visualized portions of the paranasal sinuses and mastoid air cells are unremarkable. Other: None. IMPRESSION: 1. No acute intracranial pathology. 2. Chronic microvascular disease and cerebral atrophy. Electronically Signed   By: Kathreen Devoid   On: 08/15/2019 16:04   Mr Angio Head Wo Contrast  Result Date: 08/16/2019 CLINICAL DATA:  Slurred speech and left facial droop EXAM: MRI HEAD WITHOUT CONTRAST MRA HEAD WITHOUT CONTRAST TECHNIQUE: Multiplanar, multiecho pulse sequences of the brain and surrounding structures were obtained without intravenous contrast. Angiographic images of the head were  obtained using MRA technique without contrast. COMPARISON:  Head CT from earlier today FINDINGS: MRI HEAD FINDINGS Brain: Small area of acute infarction in the lateral left medulla. Remote small vessel infarcts in the right cerebellum, bilateral deep gray nuclei, and bilateral deep white matter tracts. Confluent ischemic gliosis in the deep cerebral white matter. Small remote left parietal and anterior right frontal cortex based infarction. No hemorrhage, hydrocephalus, or masslike finding. Vascular: Normal flow voids Skull and upper cervical spine: Nodes negative for marrow lesion Sinuses/Orbits: Right maxillary sinusitis with mucosal thickening and fluid. This at least partially chronic given sinus atelectasis MRA HEAD FINDINGS Extensive atheromatous irregularity of bilateral vertebral, carotid, and basilar  arteries. There is up to moderate narrowing of the right ICA at the supraclinoid segment. Mild-to-moderate distal basilar narrowing. Branch vessels show diffuse atheromatous type irregularity without major branch occlusion. Apparent outpouchings inferiorly at the vertebrobasilar junction, leftward from the basilar, and superiorly at the paraclinoid right ICA segment likely atheromatous, especially the former given location. IMPRESSION: Brain MRI: 1. Acute left lateral medullary infarct. 2. Advanced chronic small vessel ischemia with numerous remote small vessel infarcts. 3. Right maxillary sinusitis. Intracranial MRA: 1. No emergent finding. 2. Diffuse and advanced atheromatous changes with up to moderate right ICA and distal basilar narrowing. Electronically Signed   By: Monte Fantasia M.D.   On: 08/16/2019 04:58   Mr Brain Wo Contrast  Result Date: 08/16/2019 CLINICAL DATA:  Slurred speech and left facial droop EXAM: MRI HEAD WITHOUT CONTRAST MRA HEAD WITHOUT CONTRAST TECHNIQUE: Multiplanar, multiecho pulse sequences of the brain and surrounding structures were obtained without intravenous contrast. Angiographic images of the head were obtained using MRA technique without contrast. COMPARISON:  Head CT from earlier today FINDINGS: MRI HEAD FINDINGS Brain: Small area of acute infarction in the lateral left medulla. Remote small vessel infarcts in the right cerebellum, bilateral deep gray nuclei, and bilateral deep white matter tracts. Confluent ischemic gliosis in the deep cerebral white matter. Small remote left parietal and anterior right frontal cortex based infarction. No hemorrhage, hydrocephalus, or masslike finding. Vascular: Normal flow voids Skull and upper cervical spine: Nodes negative for marrow lesion Sinuses/Orbits: Right maxillary sinusitis with mucosal thickening and fluid. This at least partially chronic given sinus atelectasis MRA HEAD FINDINGS Extensive atheromatous irregularity of bilateral  vertebral, carotid, and basilar arteries. There is up to moderate narrowing of the right ICA at the supraclinoid segment. Mild-to-moderate distal basilar narrowing. Branch vessels show diffuse atheromatous type irregularity without major branch occlusion. Apparent outpouchings inferiorly at the vertebrobasilar junction, leftward from the basilar, and superiorly at the paraclinoid right ICA segment likely atheromatous, especially the former given location. IMPRESSION: Brain MRI: 1. Acute left lateral medullary infarct. 2. Advanced chronic small vessel ischemia with numerous remote small vessel infarcts. 3. Right maxillary sinusitis. Intracranial MRA: 1. No emergent finding. 2. Diffuse and advanced atheromatous changes with up to moderate right ICA and distal basilar narrowing. Electronically Signed   By: Monte Fantasia M.D.   On: 08/16/2019 04:58   Ct Head Code Stroke Wo Contrast  Result Date: 08/16/2019 CLINICAL DATA:  Code stroke. Slurred speech and left facial droop with left-sided weakness EXAM: CT HEAD WITHOUT CONTRAST TECHNIQUE: Contiguous axial images were obtained from the base of the skull through the vertex without intravenous contrast. COMPARISON:  None. FINDINGS: Brain: There is no mass, hemorrhage or extra-axial collection. There is generalized atrophy without lobar predilection. Areas of hypoattenuation of the deep gray nuclei and confluent periventricular white matter hypodensity, consistent  with chronic small vessel disease. There are multiple old infarcts. Vascular: No abnormal hyperdensity of the major intracranial arteries or dural venous sinuses. No intracranial atherosclerosis. Skull: The visualized skull base, calvarium and extracranial soft tissues are normal. Sinuses/Orbits: No fluid levels or advanced mucosal thickening of the visualized paranasal sinuses. No mastoid or middle ear effusion. The orbits are normal. ASPECTS [facility] Stroke Program Early CT Score) - Ganglionic level  infarction (caudate, lentiform nuclei, internal capsule, insula, M1-M3 cortex): 7 - Supraganglionic infarction (M4-M6 cortex): 3 Total score (0-10 with 10 being normal): 10 IMPRESSION: 1. No acute intracranial abnormality. 2. 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 3. ASPECTS is 10 These results were communicated to Dr. Kerney Elbe at 12:43 am on 08/16/2019 by text page via the [facility] messaging system. Electronically Signed   By: Ulyses Jarred M.D.   On: 08/16/2019 00:44   Vas US Carotid (at [place] Only)  Result Date: 08/16/2019 Carotid Arterial Duplex Study Indications: TIA. Performing Technologist: Antonieta Pert RDMS, RVT  Examination Guidelines: A complete evaluation includes B-mode imaging, spectral Doppler, color Doppler, and power Doppler as needed of all accessible portions of each vessel. Bilateral testing is considered an integral part of a complete examination. Limited examinations for reoccurring indications may be performed as noted.  Right Carotid Findings: +----------+--------+--------+--------+-------------------------------+--------+           PSV cm/sEDV cm/sStenosisPlaque Description             Comments +----------+--------+--------+--------+-------------------------------+--------+ CCA Prox  65      12                                                      +----------+--------+--------+--------+-------------------------------+--------+ CCA Distal64      19              diffuse, hyperechoic and                                                  heterogenous                            +----------+--------+--------+--------+-------------------------------+--------+ ICA Prox  62      11              diffuse and heterogenous                +----------+--------+--------+--------+-------------------------------+--------+ ICA Distal66      22                                                       +----------+--------+--------+--------+-------------------------------+--------+ ECA       87      10                                                      +----------+--------+--------+--------+-------------------------------+--------+ +----------+--------+-------+----------------+-------------------+  PSV cm/sEDV cmsDescribe        Arm Pressure (mmHG) +----------+--------+-------+----------------+-------------------+ [id number]            Multiphasic, WNL                    +----------+--------+-------+----------------+-------------------+ +---------+--------+--+--------+--+---------+ VertebralPSV cm/s58EDV cm/s13Antegrade +---------+--------+--+--------+--+---------+ Right vertebral VERY difficult to evaluate. Left Carotid Findings: +----------+--------+--------+--------+------------------+--------+           PSV cm/sEDV cm/sStenosisPlaque DescriptionComments +----------+--------+--------+--------+------------------+--------+ CCA Prox  77      14                                         +----------+--------+--------+--------+------------------+--------+ CCA Distal68      12                                         +----------+--------+--------+--------+------------------+--------+ ICA Prox  71      19              focal and calcific         +----------+--------+--------+--------+------------------+--------+ ICA Distal73      18                                         +----------+--------+--------+--------+------------------+--------+ ECA       124     9                                          +----------+--------+--------+--------+------------------+--------+ +----------+--------+--------+----------------+-------------------+           PSV cm/sEDV cm/sDescribe        Arm Pressure (mmHG) +----------+--------+--------+----------------+-------------------+ [id number]              Multiphasic, WNL                     +----------+--------+--------+----------------+-------------------+ +---------+--------+--+--------+--+---------+ VertebralPSV cm/s38EDV cm/s13Antegrade +---------+--------+--+--------+--+---------+  Summary: Right Carotid: Velocities in the right ICA are consistent with a 1-39% stenosis. Left Carotid: Velocities in the left ICA are consistent with a 1-39% stenosis. Vertebrals:  Bilateral vertebral arteries demonstrate antegrade flow. Subclavians: Normal flow hemodynamics were seen in bilateral subclavian              arteries. *See table(s) above for measurements and observations.     Preliminary     PHYSICAL EXAM  Temp:  [98.3 F (36.8 C)] 98.3 F (36.8 C) (10/08 0100) Pulse Rate:  [62-71] 66 (10/08 0830) Resp:  [13-22] 21 (10/08 1400) BP: (135-162)/(59-94) 162/80 (10/08 1400) SpO2:  [92 %-99 %] 96 % (10/08 1313) Weight:  [97.5 kg] 97.5 kg (10/08 0052)  General - Well nourished, well developed, in no apparent distress.  Ophthalmologic - fundi not visualized due to noncooperation.  Cardiovascular - Regular rhythm and rate.  Mental Status -  Level of arousal and orientation to time, place, and person were intact. Language including expression, naming, repetition, comprehension was assessed and found intact. Fund of Knowledge was assessed and was intact.  Cranial Nerves II - XII - II - Visual field intact OU. III, IV, VI - Extraocular movements intact. V - Facial sensation intact bilaterally. VII -  Facial movement intact bilaterally. VIII - Hard of hearing & vestibular intact bilaterally. X - Palate elevates symmetrically. Intermittent coughing in between bites XI - Chin turning & shoulder shrug intact bilaterally. XII - Tongue protrusion intact.  Motor Strength - The patient's strength was normal in all extremities and pronator drift was absent.  Bulk was normal and fasciculations were absent.   Motor Tone - Muscle tone was assessed at the neck and appendages and was  normal.  Reflexes - The patient's reflexes were symmetrical in all extremities and he had no pathological reflexes.  Sensory - Light touch, temperature/pinprick were assessed and were symmetrical.    Coordination - The patient had normal movements in the hands with no ataxia or dysmetria.  Tremor was absent.  Gait and Station - deferred.   ASSESSMENT/PLAN Craig Chapman is a 85 y.o. male with history of AF not on AC, HTN, TIA, lung cancer s/p L pneumonectomy  presenting with L facial droop, dryarthria, expressive aphasia, listing to the L with ambulation with BP spike of 200/100 with vomiting en route followed by bradycardia w/ presyncope.   Stroke:   L lateral medularry infarct mostly secondary to small vessel disease source  CT head 10/7 no acute abnormality. Small vessel disease. Atrophy.   Code Stroke CT head 10/8 No acute abnormality. Small vessel disease. Atrophy. ASPECTS 10.     MRI  L laterally medullary infarct. Advanced small vessel disease. Numerous old small vessel infarcts. R maxillary sinus dz.   MRA  No ELVO. Moderate R ICA and distal BA narrowing  Carotid Doppler  B ICA 1-39% stenosis, VAs antegrade   2D Echo EF 60-65%. No source of embolus   LDL 115  HgbA1c 5.4  Lovenox 40 mg sq daily for VTE prophylaxis  ASA 81 listed prior to admission, now on aspirin 325 mg daily and clopidogrel 75 mg daily. Continue ASA 325 and plavix for 3 months and then plavix alone  Therapy recommendations:  pending   Disposition:  pending   No evidence of atrial Fibrillation . Craig Chapman and pt denies any hx of AFib . EKG NSR . No evidence of afib in documentation . Current stroke most likely small vessel disease not embolic pattern   Hypertensive Urgency  BP as high as 200/100 on arrival  Stable . Permissive hypertension (OK if < 220/120) but gradually normalize in 5-7 days . Long-term BP goal normotensive  Hyperlipidemia  Home meds:  No statin listed  Now on  lipitor 40  LDL 115, goal < 70  Continue statin at discharge  Dysphagia  Mild dysphagia  Consistent with lateral medullary syndrome  Speech to follow  Avoid aspiration  Other Stroke Risk Factors  Advanced age  Overweight, Body mass index is 29.15 kg/m., recommend weight loss, diet and exercise as appropriate   Hx TIA  02/2015 - transient balance difficulties. MRI neg. On aspirin.   Other Active Problems  Hx lung cancer s/p L penumonectomy  Hx lymphoma  Prolonged OTC  [facility] day # 0  Neurology will sign off. Please call with questions. Pt will follow up with stroke clinic NP at [facility] in about 4 weeks. Thanks for the consult.  Rosalin Hawking, MD PhD Stroke Neurology 08/16/2019 5:54 PM    To contact Stroke Continuity provider, please refer to [email or web address] After hours, contact General Neurology

## 2019-08-16 NOTE — Consult Note (Signed)
Referring Physician: Dr. Leonides Schanz    Chief Complaint: Left facial droop, dysarthria and expressive aphasia with listing to the left when attempting to ambulate   HPI: Craig Chapman is an 85 y.o. male presenting via EMS with left facial droop, dysarthria and expressive aphasia with listing to the left when attempting to ambulate. LKN was 1400 when patient began experiencing dysphagia. He had seen a physician earlier in the day for a left sided headache with CT head being negative. Later this evening, the patients additional symptoms, as listed above, manifested and EMS was called. BP was 130/70 en route initially, then increased to 200/100. CBG was 137. He vomited en route and then experienced bradycardia down to 32 with presyncope, which then resolved.   He has a history of a-fib but per report is not taking a blood thinner. He has a history of TIA, HTN and left pneumonectomy in the context of a history of lung cancer. Also with arthritis and a history of lymphoma.   The patient states that he is not on any medications. Note from today's clinic visit confirms this.   LSN: 1400 tPA Given: No: Out of time window  Past Medical History:  Diagnosis Date  . Arthritis   . Atrial fibrillation ([place])   . History of lung or bronchial cancer   . Hypertension   . Lymphoma ([place])   . TIA (transient ischemic attack)     Past Surgical History:  Procedure Laterality Date  . BACK SURGERY    . CHOLECYSTECTOMY    . HERNIA REPAIR    . ORIF FEMUR FRACTURE Right 05/15/2017   Procedure: OPEN REDUCTION INTERNAL FIXATION (ORIF) DISTAL FEMUR FRACTURE;  Surgeon: Newt Minion, MD;  Location: [place];  Service: Orthopedics;  Laterality: Right;  . PNEUMONECTOMY    . REPLACEMENT TOTAL KNEE BILATERAL    . ROTATOR CUFF REPAIR      No family history on file. Social History:  reports that he has quit smoking. He has never used smokeless tobacco. He reports that he does not drink alcohol or use drugs.  Allergies:   Allergies  Allergen Reactions  . Sulfa Antibiotics Hives    Medications:  No home medications  ROS: Left sided headache. Other ROS as per HPI. The patient does not endorse any additional symptoms in the context of sparse verbal output. Detailed ROS deferred in the context of acuity of presentation.   Physical Examination: There were no vitals taken for this visit.  HEENT: [place]/AT Lungs: Respirations unlabored Ext: Warm and well perfused.  Neurologic Examination: Mental Status:  Alert, fully oriented except stating that it is "Tuesday". Some halting quality to speech but no errors or grammar or syntax and no paraphasias noted. Repetition intact although there is a mild stuttering/halting quality. Naming intact. Able to follow all commands. Mild dysarthria.  Cranial Nerves: II:  Visual fields intact with no extinction to DSS. PERRL. III,IV, VI: No ptosis. EOM are full with saccadic visual pursuits noted.  V,VII: Smile symmetric, facial temp sensation equal bilaterally VIII: HOH IX,X: Palate rises symmetrically  XI: Symmetric.  XII: midline tongue extension  Motor: RUE and RLE 5/5 LUE and LLE 4+/5 Sensory: Temp sensation decreased to RUE and RLE. FT intact x 4. No extinction.  Deep Tendon Reflexes:  2+ bilateral brachioradialis and biceps 1+ left patella, 0 right patella 0 achilles bilaterally Tonically upgoing toes bilaterally  Cerebellar: No ataxia with FNF bilaterally  Gait: Deferred  Results for orders placed or performed during  the hospital encounter of 08/16/19 (from the past 48 hour(s))  CBG monitoring, ED     Status: Abnormal   Collection Time: 08/16/19 12:29 AM  Result Value Ref Range   Glucose-Capillary 142 (H) 70 - 99 mg/dL   Ct Head Wo Contrast  Result Date: 08/15/2019 CLINICAL DATA:  History of TIAs, headache EXAM: CT HEAD WITHOUT CONTRAST TECHNIQUE: Contiguous axial images were obtained from the base of the skull through the vertex without intravenous  contrast. COMPARISON:  08/27/2009 FINDINGS: Brain: No evidence of acute infarction, hemorrhage, extra-axial collection, ventriculomegaly, or mass effect. Old right basal ganglia lacunar infarct. Generalized cerebral atrophy. Periventricular white matter low attenuation likely secondary to microangiopathy. Vascular: Cerebrovascular atherosclerotic calcifications are noted. Skull: Negative for fracture or focal lesion. Sinuses/Orbits: Visualized portions of the orbits are unremarkable. Visualized portions of the paranasal sinuses and mastoid air cells are unremarkable. Other: None. IMPRESSION: 1. No acute intracranial pathology. 2. Chronic microvascular disease and cerebral atrophy. Electronically Signed   By: Kathreen Devoid   On: 08/15/2019 16:04    Assessment: 85 y.o. male presenting with left facial droop, dysarthria and expressive aphasia with listing to the left when attempting to ambulate. LKN was 1400 when patient began experiencing dysphagia. He had seen a physician earlier in the day for a left sided headache with CT head being negative. Later this evening, the patients additional symptoms, as listed above, manifested and EMS was called. BP was 130/70 en route initially, then increased to 200/100. CBG was 137. He vomited en route and then experienced bradycardia down to 32 with presyncope, which then resolved.  1. Neurological exam not consistent with LVO. Relatively mild neurological deficits on exam. CT head reveals an old right basal ganglia lacunar infarct, generalized cerebral atrophy and chronic microvascular ischemic changes.  2. Stroke Risk Factors - Prior stroke, HTN, history of cancer and atrial fibrillation 3. The patient states that he is on no medications at home despite his history of TIA and a-fib 4. The patient is not a tPA candidate due to time criteria. He is not a VIR candidate due to no findings on exam consistent with LVO.   Recommendations: 1. HgbA1c, fasting lipid panel 2. MRI,  MRA of the brain without contrast 3. PT consult, OT consult, Speech consult 4. Echocardiogram 5. Carotid dopplers 6. Prophylactic therapy- Start ASA 81 mg po qd after 650 mg po load 7. Risk factor modification 8. Telemetry monitoring 9. Frequent neuro checks 10. Modified permissive HTN protocol due to advanced age. Correct BP is systolic is > 99991111.    @Electronically  signed: Dr. Kerney Elbe  08/16/2019, 12:34 AM

## 2019-08-16 NOTE — ED Notes (Signed)
Pt taken to MRI  

## 2019-08-16 NOTE — Progress Notes (Addendum)
Addendum  Paged by ED RN stating that patient coughs at times when he eats, reportedly has been going on for couple of days. No SOB/CP/Fever.   Likely dysphagia d/t CVA. Ordered swallow eval by ST > will happen tomorrow. Since he has only one functioning lung and a large aspiration would be detrimental, changed to NPO except meds until swallow evaluation in am. Communicated change with night RN.  Ordered and reviewed CXR with Radiologist Dr. Derrel Nip > Prior L Pneumonectomy/white out of L lung field which is unchanged and R lung fields clear.   Vernell Leep, MD, FACP, [facility]. [facility]  To contact the attending provider between 7A-7P or the covering provider during after hours 7P-7A, please log into the web site [email or web address] and access using universal [place] password for that web site. If you do not have the password, please call the hospital operator.

## 2019-08-16 NOTE — Progress Notes (Signed)
Echocardiogram 2D Echocardiogram has been performed.  Oneal Deputy Jimmy Plessinger 08/16/2019, 12:59 PM

## 2019-08-16 NOTE — Evaluation (Signed)
Physical Therapy Evaluation Patient Details Name: Craig Chapman MRN: [id number] DOB: [birth year] Today's Date: 08/16/2019   History of Present Illness  Pt is an 85 y/o male admitted secondary to expressive aphasia and L facial droop. MRI negative for L lateral medullary infarct. PMH includes lymphoma, lung cancer, HTN, a fib, and CVA with residual L weakness.   Clinical Impression  Pt admitted secondary to problem above with deficits below. Pt with limited tolerance for mobility this session and only able to take side steps at EOB using RW. Required mod A for bed mobility and min A to stand and take side steps with RW. Reports his wife is available to assist as needed at d/c. Will need to progress mobility and further assess balance during next session; will update recommendations pending progress. Will continue to follow acutely to maximize functional mobility independence and safety.     Follow Up Recommendations Other (comment)(TBD pending mobility progression)    Equipment Recommendations  Other (comment)(TBD)    Recommendations for Other Services       Precautions / Restrictions Precautions Precautions: Fall Restrictions Weight Bearing Restrictions: No      Mobility  Bed Mobility Overal bed mobility: Needs Assistance Bed Mobility: Supine to Sit;Sit to Supine     Supine to sit: Mod assist Sit to supine: Min assist   General bed mobility comments: Mod A for trunk elevation to come to sitting on uneven stretcher height. Min A for LE assist for return to supine.   Transfers Overall transfer level: Needs assistance Equipment used: Rolling walker (2 wheeled) Transfers: Sit to/from Stand Sit to Stand: Min assist;From elevated surface         General transfer comment: Min A for lift assist and steadying from higher stretcher height. Pt reports "feeling funny" and was unable to exaggerate. Took side steps at EOB for repositioning and had pt return to sitting. VSS.    Ambulation/Gait Ambulation/Gait assistance: Min assist   Assistive device: Rolling walker (2 wheeled)       General Gait Details: Pt performed side steps at EOB. Pt reports "feeling funny" and unable to elaborate, so had pt return to sitting. VSS.   Stairs            Wheelchair Mobility    Modified Rankin (Stroke Patients Only) Modified Rankin (Stroke Patients Only) Pre-Morbid Rankin Score: No significant disability Modified Rankin: Moderately severe disability     Balance Overall balance assessment: Needs assistance Sitting-balance support: No upper extremity supported;Feet supported Sitting balance-Leahy Scale: Fair     Standing balance support: Bilateral upper extremity supported;During functional activity Standing balance-Leahy Scale: Poor Standing balance comment: Reliant on BUE support                              Pertinent Vitals/Pain Pain Assessment: No/denies pain    Home Living Family/patient expects to be discharged to:: Private residence Living Arrangements: Spouse/significant other Available Help at Discharge: Family;Available 24 hours/day Type of Home: House Home Access: Stairs to enter Entrance Stairs-Rails: Right Entrance Stairs-Number of Steps: 6 Home Layout: One level Home Equipment: Walker - 2 wheels;Toilet riser      Prior Function Level of Independence: Independent with assistive device(s)         Comments: Used RW for ambulation      Hand Dominance        Extremity/Trunk Assessment   Upper Extremity Assessment Upper Extremity Assessment: Defer to OT evaluation  Lower Extremity Assessment Lower Extremity Assessment: LLE deficits/detail;Generalized weakness LLE Deficits / Details: LLE weakness at baseline.     Cervical / Trunk Assessment Cervical / Trunk Assessment: Normal  Communication   Communication: HOH  Cognition Arousal/Alertness: Awake/alert Behavior During Therapy: WFL for tasks  assessed/performed Overall Cognitive Status: No family/caregiver present to determine baseline cognitive functioning                                 General Comments: Somewhat slowed processing and slowed response to questions      General Comments General comments (skin integrity, edema, etc.): No family present     Exercises     Assessment/Plan    PT Assessment Patient needs continued PT services  PT Problem List Decreased strength;Decreased balance;Decreased mobility;Decreased activity tolerance;Decreased cognition;Decreased knowledge of use of DME;Decreased knowledge of precautions       PT Treatment Interventions Gait training;Stair training;DME instruction;Functional mobility training;Therapeutic exercise;Therapeutic activities;Balance training;Patient/family education    PT Goals (Current goals can be found in the Care Plan section)  Acute Rehab PT Goals Patient Stated Goal: to go home PT Goal Formulation: With patient Time For Goal Achievement: 08/30/19 Potential to Achieve Goals: Good    Frequency Min 4X/week   Barriers to discharge        Co-evaluation               AM-PAC PT "6 Clicks" Mobility  Outcome Measure Help needed turning from your back to your side while in a flat bed without using bedrails?: A Lot Help needed moving from lying on your back to sitting on the side of a flat bed without using bedrails?: A Lot Help needed moving to and from a bed to a chair (including a wheelchair)?: A Little Help needed standing up from a chair using your arms (e.g., wheelchair or bedside chair)?: A Little Help needed to walk in hospital room?: A Lot Help needed climbing 3-5 steps with a railing? : A Lot 6 Click Score: 14    End of Session Equipment Utilized During Treatment: Gait belt Activity Tolerance: Treatment limited secondary to medical complications (Comment) Patient left: in bed;with call bell/phone within reach Nurse Communication:  Mobility status PT Visit Diagnosis: Unsteadiness on feet (R26.81);Muscle weakness (generalized) (M62.81);Other abnormalities of gait and mobility (R26.89)    Time: 1142-1200 PT Time Calculation (min) (ACUTE ONLY): 18 min   Charges:   PT Evaluation $PT Eval Moderate Complexity: Ericson, PT, DPT  Acute Rehabilitation Services  Pager: [phone] Office: [phone]   Rudean Hitt 08/16/2019, 5:12 PM

## 2019-08-16 NOTE — Progress Notes (Addendum)
PROGRESS NOTE   Craig Chapman  [id number]    DOB: August 18, 1928    DOA: 08/16/2019  PCP: Lavone Orn, MD   I have briefly reviewed patients previous medical records in [facility].  Chief Complaint  Patient presents with   Code Stroke    Brief Narrative:  85 year old married male, ambulates with the help of a walker, PMH of lymphoma and lung cancer in remission, A. fib not on anticoagulation, HTN, reported stroke with left-sided weakness, uses hearing aids, presented to [facility] ED on 10/8 due to left facial droop, dysarthria, expressive aphasia and drifting to left when ambulating.  He had seen a physician earlier in the day for headache and CT head was negative.  Developed further symptoms later that day, BP increased to 200/100, had an episode of vomiting on route with bradycardia down to 32 with presyncope which resolved.  Admitted for acute ischemic stroke.  Neurology consulted.   Assessment & Plan:   Principal Problem:   TIA (transient ischemic attack) Active Problems:   PAF (paroxysmal atrial fibrillation) (HCC)   Hypertension   Acute ischemic stroke ([place])   Acute ischemic stroke  OP CT head without contrast 10/7: No acute intracranial pathology.  CT head code stroke 10/7: No acute intracranial abnormality.  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  Brain MRI: Acute left lateral medullary infarct.  Intracranial MRA: No emergent finding.  Diffuse and advanced atheromatous changes with up to moderate right ICA and distal basilar narrowing.  A1c 5.4  LDL 115.  Follow TTE and carotid Dopplers.  Neurology consultation appreciated.  Stroke MD follow-up pending.  Not on antiplatelets or AC prior to admission, now on aspirin 325 mg daily + Plavix 75 mg daily.  Etiology: Small vessel disease versus embolic due to history of A. Fib (may be less likely, await neuro input and if this is considered high then may need to consider  anticoagulation)  Therapies evaluation pending.  Hyperlipidemia  LDL 115.  Goal <70.  Atorvastatin 40 mg daily initiated by neurology, continue.  Essential hypertension  Due to recent acute ischemic stroke, allow permissive hypertension and treat only if BP consistently >220/120.  Paroxysmal A. Fib  Not on rate control medications or anticoagulation PTA.  Currently in sinus rhythm.  Prolonged QTC  EKG on admission showed QTC of 517 ms.  Appears to be chronic.  Monitor on telemetry.  Avoid QT prolonging medications.  Keep potassium >4 and magnesium >2.  Follow EKG closely.  History of lung cancer and lymphoma  Said to be in remission.  Hard of hearing  Uses hearing aids.  Overweight/Body mass index is 29.15 kg/m.     DVT prophylaxis: Lovenox Code Status: Full Family Communication: None at bedside.  Unsuccessful in reaching spouse or son via phone and no option to leave VM message. Disposition: To be determined pending clinical improvement.   Consultants:  Neurology  Procedures:  None  Antimicrobials:  None   Subjective: Patient interviewed and examined this morning in the ED along with his nurses at bedside.  Overall feels better.  Still complains of some slurred speech.  Indicates that he uses hearing aids.  Also states that he has left-sided weakness from prior stroke about 10 years ago.  Reports that his right knee did not heal well after surgery and ambulates with the help of a walker.  No other complaints reported  Objective:  Vitals:   08/16/19 0631 08/16/19 0700 08/16/19 0800 08/16/19 0830  BP: (!) 158/67 (!) 145/71 (!) 147/67 (!) 148/76  Pulse: 68 62 62 66  Resp: 19 19 19 18   Temp:      SpO2: 99% 98% 96% 96%  Weight:      Height:        Examination:  General exam: Pleasant elderly male, moderately built and overweight lying comfortably supine in bed. Respiratory system: Clear to auscultation. Respiratory effort  normal. Cardiovascular system: S1 & S2 heard, RRR. No JVD, murmurs, rubs, gallops or clicks. No pedal edema. Gastrointestinal system: Abdomen is nondistended, soft and nontender. No organomegaly or masses felt. Normal bowel sounds heard. Central nervous system: Alert and oriented only to person and place but not to time July 19, 2019.  Dysarthria +.  Facial asymmetry +. Extremities: Right limbs grade 5 x 5 power.  Left limbs 4+ by 5 power.  Right knee healed surgical scar.  Hard of hearing. Skin: No rashes, lesions or ulcers Psychiatry: Judgement and insight appear somewhat impaired. Mood & affect appropriate.     Data Reviewed: I have personally reviewed following labs and imaging studies  CBC: Recent Labs  Lab 08/16/19 0028 08/16/19 0035 08/16/19 0505  WBC 7.1  --  5.9  NEUTROABS 4.4  --   --   HGB 12.8* 12.9* 12.6*  HCT 38.8* 38.0* 37.8*  MCV 99.5  --  99.7  PLT 172  --  [id number]   Basic Metabolic Panel: Recent Labs  Lab 08/16/19 0028 08/16/19 0035 08/16/19 0505  NA 136 137  --   K 3.7 3.6  --   CL 103 101  --   CO2 25  --   --   GLUCOSE 151* 142*  --   BUN 17 18  --   CREATININE 1.10 1.00 0.95  CALCIUM 8.9  --   --    Liver Function Tests: Recent Labs  Lab 08/16/19 0028  AST 51*  ALT 37  ALKPHOS 50  BILITOT 0.6  PROT 7.3  ALBUMIN 3.6    Cardiac Enzymes: No results for input(s): CKTOTAL, CKMB, CKMBINDEX, TROPONINI in the last 168 hours.  CBG: Recent Labs  Lab 08/16/19 0029  GLUCAP 142*    Recent Results (from the past 240 hour(s))  SARS CORONAVIRUS 2 (TAT 6-24 HRS) Nasopharyngeal Nasopharyngeal Swab     Status: None   Collection Time: 08/16/19  1:18 AM   Specimen: Nasopharyngeal Swab  Result Value Ref Range Status   SARS Coronavirus 2 NEGATIVE NEGATIVE Final    Comment: (NOTE) SARS-CoV-2 target nucleic acids are NOT DETECTED. The SARS-CoV-2 RNA is generally detectable in upper and lower respiratory specimens during the acute phase of  infection. Negative results do not preclude SARS-CoV-2 infection, do not rule out co-infections with other pathogens, and should not be used as the sole basis for treatment or other patient management decisions. Negative results must be combined with clinical observations, patient history, and epidemiological information. The expected result is Negative. Fact Sheet for Patients: SugarRoll.be Fact Sheet for Healthcare Providers: https://www.woods-mathews.com/ This test is not yet approved or cleared by the [place] FDA and  has been authorized for detection and/or diagnosis of SARS-CoV-2 by FDA under an Emergency Use Authorization (EUA). This EUA will remain  in effect (meaning this test can be used) for the duration of the COVID-19 declaration under Section 56 4(b)(1) of the Act, 21 U.S.C. section 360bbb-3(b)(1), unless the authorization is terminated or revoked sooner. Performed at [facility], [place] [address] [place], [place] [address]  Radiology Studies: Ct Head Wo Contrast  Result Date: 08/15/2019 CLINICAL DATA:  History of TIAs, headache EXAM: CT HEAD WITHOUT CONTRAST TECHNIQUE: Contiguous axial images were obtained from the base of the skull through the vertex without intravenous contrast. COMPARISON:  08/27/2009 FINDINGS: Brain: No evidence of acute infarction, hemorrhage, extra-axial collection, ventriculomegaly, or mass effect. Old right basal ganglia lacunar infarct. Generalized cerebral atrophy. Periventricular white matter low attenuation likely secondary to microangiopathy. Vascular: Cerebrovascular atherosclerotic calcifications are noted. Skull: Negative for fracture or focal lesion. Sinuses/Orbits: Visualized portions of the orbits are unremarkable. Visualized portions of the paranasal sinuses and mastoid air cells are unremarkable. Other: None. IMPRESSION: 1. No acute intracranial pathology. 2. Chronic  microvascular disease and cerebral atrophy. Electronically Signed   By: Kathreen Devoid   On: 08/15/2019 16:04   Mr Angio Head Wo Contrast  Result Date: 08/16/2019 CLINICAL DATA:  Slurred speech and left facial droop EXAM: MRI HEAD WITHOUT CONTRAST MRA HEAD WITHOUT CONTRAST TECHNIQUE: Multiplanar, multiecho pulse sequences of the brain and surrounding structures were obtained without intravenous contrast. Angiographic images of the head were obtained using MRA technique without contrast. COMPARISON:  Head CT from earlier today FINDINGS: MRI HEAD FINDINGS Brain: Small area of acute infarction in the lateral left medulla. Remote small vessel infarcts in the right cerebellum, bilateral deep gray nuclei, and bilateral deep white matter tracts. Confluent ischemic gliosis in the deep cerebral white matter. Small remote left parietal and anterior right frontal cortex based infarction. No hemorrhage, hydrocephalus, or masslike finding. Vascular: Normal flow voids Skull and upper cervical spine: Nodes negative for marrow lesion Sinuses/Orbits: Right maxillary sinusitis with mucosal thickening and fluid. This at least partially chronic given sinus atelectasis MRA HEAD FINDINGS Extensive atheromatous irregularity of bilateral vertebral, carotid, and basilar arteries. There is up to moderate narrowing of the right ICA at the supraclinoid segment. Mild-to-moderate distal basilar narrowing. Branch vessels show diffuse atheromatous type irregularity without major branch occlusion. Apparent outpouchings inferiorly at the vertebrobasilar junction, leftward from the basilar, and superiorly at the paraclinoid right ICA segment likely atheromatous, especially the former given location. IMPRESSION: Brain MRI: 1. Acute left lateral medullary infarct. 2. Advanced chronic small vessel ischemia with numerous remote small vessel infarcts. 3. Right maxillary sinusitis. Intracranial MRA: 1. No emergent finding. 2. Diffuse and advanced  atheromatous changes with up to moderate right ICA and distal basilar narrowing. Electronically Signed   By: Monte Fantasia M.D.   On: 08/16/2019 04:58   Mr Brain Wo Contrast  Result Date: 08/16/2019 CLINICAL DATA:  Slurred speech and left facial droop EXAM: MRI HEAD WITHOUT CONTRAST MRA HEAD WITHOUT CONTRAST TECHNIQUE: Multiplanar, multiecho pulse sequences of the brain and surrounding structures were obtained without intravenous contrast. Angiographic images of the head were obtained using MRA technique without contrast. COMPARISON:  Head CT from earlier today FINDINGS: MRI HEAD FINDINGS Brain: Small area of acute infarction in the lateral left medulla. Remote small vessel infarcts in the right cerebellum, bilateral deep gray nuclei, and bilateral deep white matter tracts. Confluent ischemic gliosis in the deep cerebral white matter. Small remote left parietal and anterior right frontal cortex based infarction. No hemorrhage, hydrocephalus, or masslike finding. Vascular: Normal flow voids Skull and upper cervical spine: Nodes negative for marrow lesion Sinuses/Orbits: Right maxillary sinusitis with mucosal thickening and fluid. This at least partially chronic given sinus atelectasis MRA HEAD FINDINGS Extensive atheromatous irregularity of bilateral vertebral, carotid, and basilar arteries. There is up to moderate narrowing of the right ICA at the supraclinoid  segment. Mild-to-moderate distal basilar narrowing. Branch vessels show diffuse atheromatous type irregularity without major branch occlusion. Apparent outpouchings inferiorly at the vertebrobasilar junction, leftward from the basilar, and superiorly at the paraclinoid right ICA segment likely atheromatous, especially the former given location. IMPRESSION: Brain MRI: 1. Acute left lateral medullary infarct. 2. Advanced chronic small vessel ischemia with numerous remote small vessel infarcts. 3. Right maxillary sinusitis. Intracranial MRA: 1. No emergent  finding. 2. Diffuse and advanced atheromatous changes with up to moderate right ICA and distal basilar narrowing. Electronically Signed   By: Monte Fantasia M.D.   On: 08/16/2019 04:58   Ct Head Code Stroke Wo Contrast  Result Date: 08/16/2019 CLINICAL DATA:  Code stroke. Slurred speech and left facial droop with left-sided weakness EXAM: CT HEAD WITHOUT CONTRAST TECHNIQUE: Contiguous axial images were obtained from the base of the skull through the vertex without intravenous contrast. COMPARISON:  None. FINDINGS: Brain: There is no mass, hemorrhage or extra-axial collection. There is generalized atrophy without lobar predilection. Areas of hypoattenuation of the deep gray nuclei and confluent periventricular white matter hypodensity, consistent with chronic small vessel disease. There are multiple old infarcts. Vascular: No abnormal hyperdensity of the major intracranial arteries or dural venous sinuses. No intracranial atherosclerosis. Skull: The visualized skull base, calvarium and extracranial soft tissues are normal. Sinuses/Orbits: No fluid levels or advanced mucosal thickening of the visualized paranasal sinuses. No mastoid or middle ear effusion. The orbits are normal. ASPECTS [facility] Stroke Program Early CT Score) - Ganglionic level infarction (caudate, lentiform nuclei, internal capsule, insula, M1-M3 cortex): 7 - Supraganglionic infarction (M4-M6 cortex): 3 Total score (0-10 with 10 being normal): 10 IMPRESSION: 1. No acute intracranial abnormality. 2. 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 3. ASPECTS is 10 These results were communicated to Dr. Kerney Elbe at 12:43 am on 08/16/2019 by text page via the [facility] messaging system. Electronically Signed   By: Ulyses Jarred M.D.   On: 08/16/2019 00:44        Scheduled Meds:   stroke: mapping our early stages of recovery book   Does not apply Once   [START ON 08/17/2019] aspirin EC  325 mg Oral Once   atorvastatin   40 mg Oral q1800   clopidogrel  75 mg Oral Daily   enoxaparin (LOVENOX) injection  40 mg Subcutaneous Daily   Continuous Infusions:  sodium chloride 50 mL/hr at 08/16/19 0652     LOS: 0 days     Vernell Leep, MD, FACP, [facility]. [facility]  To contact the attending provider between 7A-7P or the covering provider during after hours 7P-7A, please log into the web site [email or web address] and access using universal [place] password for that web site. If you do not have the password, please call the hospital operator.  08/16/2019, 9:57 AM

## 2019-08-16 NOTE — H&P (Addendum)
History and Physical    LUCKY GARCES [id number] DOB: Aug 14, 1928 DOA: 08/16/2019  PCP: Lavone Orn, MD  Patient coming from: Home.  Chief Complaint: Left-sided weakness.  History obtained from patient's wife.  ER physician.  Neurologist.  HPI: Craig Chapman is a 85 y.o. male with history of lymphoma and lung cancer in remission, A. fib per the chart presently not taking any medication has been not feeling well for last 3 days with left frontal headache and had gone to his primary care physician yesterday had a CT head which was negative and had come back home.  By the time patient reached home it was around 3 PM to 4 PM.  Patient's wife started noticing the patient's voice is gotten more hoarse and also has been having some left facial droop and left-sided weakness.  This persisted and later in the evening around 8 PM EMS was called and was brought to the ER.  ED Course: In the ER patient's weakness is slightly improved CT head is unremarkable.  EKG shows normal sinus rhythm.  Labs show creatinine 1 hemoglobin 12.8 platelets 172 COVID-19 was negative drug screen was negative.  On exam patient still has mild weakness of the left lower extremity.  On-call neurologist was consulted and patient admitted for further work-up of TIA versus stroke.  Review of Systems: As per HPI, rest all negative.   Past Medical History:  Diagnosis Date   Arthritis    Atrial fibrillation ([place])    History of lung or bronchial cancer    Hypertension    Lymphoma ([place])    TIA (transient ischemic attack)     Past Surgical History:  Procedure Laterality Date   BACK SURGERY     CHOLECYSTECTOMY     HERNIA REPAIR     ORIF FEMUR FRACTURE Right 05/15/2017   Procedure: OPEN REDUCTION INTERNAL FIXATION (ORIF) DISTAL FEMUR FRACTURE;  Surgeon: Newt Minion, MD;  Location: [facility];  Service: Orthopedics;  Laterality: Right;   PNEUMONECTOMY     REPLACEMENT TOTAL KNEE BILATERAL     ROTATOR CUFF  REPAIR       reports that he has quit smoking. He has never used smokeless tobacco. He reports that he does not drink alcohol or use drugs.  Allergies  Allergen Reactions   Sulfa Antibiotics Hives    Family History  Family history unknown: Yes    Prior to Admission medications   Medication Sig Start Date End Date Taking? Authorizing Provider  acetaminophen (TYLENOL) 500 MG tablet Take 1 tablet (500 mg total) by mouth every 6 (six) hours as needed for mild pain. Patient not taking: Reported on 02/08/2018 05/15/17   Newt Minion, MD  aspirin EC 81 MG tablet Take 1 tablet (81 mg total) by mouth daily. Patient not taking: Reported on 02/08/2018 05/15/17   Newt Minion, MD  docusate sodium (COLACE) 100 MG capsule Take 1 capsule (100 mg total) by mouth 2 (two) times daily. Patient not taking: Reported on 02/08/2018 05/17/17   Rai, Vernelle Emerald, MD  ibuprofen (ADVIL,MOTRIN) 200 MG tablet Take 200 mg by mouth every 6 (six) hours as needed for headache or moderate pain.    [provider]    Physical Exam: Constitutional: Moderately built and nourished. Vitals:   08/16/19 0052 08/16/19 0100  BP:  (!) 158/78  Pulse:  69  Resp:  (!) 22  Temp:  98.3 F (36.8 C)  SpO2:  94%  Weight: 97.5 kg  Height: 6' (1.829 m)    Eyes: Anicteric no pallor. ENMT: No discharge from the ears eyes nose or mouth. Neck: No mass felt.  No neck rigidity. Respiratory: No rhonchi or crepitations. Cardiovascular: S1-S2 heard. Abdomen: Soft nontender bowel sounds present. Musculoskeletal: No edema. Skin: No rash. Neurologic: Alert awake oriented to time place and person.  Left lower extremities 4 x 5.  Distal extremities are 5 x 5.  No facial asymmetry tongue is midline.  Pupils are equal and reacting to light. Psychiatric: Appears normal.   Labs on Admission: I have personally reviewed following labs and imaging studies  CBC: Recent Labs  Lab 08/16/19 0028 08/16/19 0035  WBC 7.1  --   NEUTROABS  4.4  --   HGB 12.8* 12.9*  HCT 38.8* 38.0*  MCV 99.5  --   PLT 172  --    Basic Metabolic Panel: Recent Labs  Lab 08/16/19 0028 08/16/19 0035  NA 136 137  K 3.7 3.6  CL 103 101  CO2 25  --   GLUCOSE 151* 142*  BUN 17 18  CREATININE 1.10 1.00  CALCIUM 8.9  --    GFR: Estimated Creatinine Clearance: 60.6 mL/min (by C-G formula based on SCr of 1 mg/dL). Liver Function Tests: Recent Labs  Lab 08/16/19 0028  AST 51*  ALT 37  ALKPHOS 50  BILITOT 0.6  PROT 7.3  ALBUMIN 3.6   No results for input(s): LIPASE, AMYLASE in the last 168 hours. No results for input(s): AMMONIA in the last 168 hours. Coagulation Profile: Recent Labs  Lab 08/16/19 0028  INR 1.2   Cardiac Enzymes: No results for input(s): CKTOTAL, CKMB, CKMBINDEX, TROPONINI in the last 168 hours. BNP (last 3 results) No results for input(s): PROBNP in the last 8760 hours. HbA1C: No results for input(s): HGBA1C in the last 72 hours. CBG: Recent Labs  Lab 08/16/19 0029  GLUCAP 142*   Lipid Profile: No results for input(s): CHOL, HDL, LDLCALC, TRIG, CHOLHDL, LDLDIRECT in the last 72 hours. Thyroid Function Tests: No results for input(s): TSH, T4TOTAL, FREET4, T3FREE, THYROIDAB in the last 72 hours. Anemia Panel: No results for input(s): VITAMINB12, FOLATE, FERRITIN, TIBC, IRON, RETICCTPCT in the last 72 hours. Urine analysis:    Component Value Date/Time   COLORURINE YELLOW 02/15/2015 1513   APPEARANCEUR CLEAR 02/15/2015 1513   LABSPEC 1.009 02/15/2015 1513   PHURINE 6.5 02/15/2015 1513   GLUCOSEU NEGATIVE 02/15/2015 1513   HGBUR NEGATIVE 02/15/2015 1513   BILIRUBINUR NEGATIVE 02/15/2015 1513   KETONESUR NEGATIVE 02/15/2015 1513   PROTEINUR NEGATIVE 02/15/2015 1513   UROBILINOGEN 0.2 02/15/2015 1513   NITRITE NEGATIVE 02/15/2015 1513   LEUKOCYTESUR NEGATIVE 02/15/2015 1513   Sepsis Labs: @LABRCNTIP (procalcitonin:4,lacticidven:4) )No results found for this or any previous visit (from the past  240 hour(s)).   Radiological Exams on Admission: Ct Head Wo Contrast  Result Date: 08/15/2019 CLINICAL DATA:  History of TIAs, headache EXAM: CT HEAD WITHOUT CONTRAST TECHNIQUE: Contiguous axial images were obtained from the base of the skull through the vertex without intravenous contrast. COMPARISON:  08/27/2009 FINDINGS: Brain: No evidence of acute infarction, hemorrhage, extra-axial collection, ventriculomegaly, or mass effect. Old right basal ganglia lacunar infarct. Generalized cerebral atrophy. Periventricular white matter low attenuation likely secondary to microangiopathy. Vascular: Cerebrovascular atherosclerotic calcifications are noted. Skull: Negative for fracture or focal lesion. Sinuses/Orbits: Visualized portions of the orbits are unremarkable. Visualized portions of the paranasal sinuses and mastoid air cells are unremarkable. Other: None. IMPRESSION: 1. No acute intracranial pathology. 2.  Chronic microvascular disease and cerebral atrophy. Electronically Signed   By: Kathreen Devoid   On: 08/15/2019 16:04   Ct Head Code Stroke Wo Contrast  Result Date: 08/16/2019 CLINICAL DATA:  Code stroke. Slurred speech and left facial droop with left-sided weakness EXAM: CT HEAD WITHOUT CONTRAST TECHNIQUE: Contiguous axial images were obtained from the base of the skull through the vertex without intravenous contrast. COMPARISON:  None. FINDINGS: Brain: There is no mass, hemorrhage or extra-axial collection. There is generalized atrophy without lobar predilection. Areas of hypoattenuation of the deep gray nuclei and confluent periventricular white matter hypodensity, consistent with chronic small vessel disease. There are multiple old infarcts. Vascular: No abnormal hyperdensity of the major intracranial arteries or dural venous sinuses. No intracranial atherosclerosis. Skull: The visualized skull base, calvarium and extracranial soft tissues are normal. Sinuses/Orbits: No fluid levels or advanced mucosal  thickening of the visualized paranasal sinuses. No mastoid or middle ear effusion. The orbits are normal. ASPECTS [facility] Stroke Program Early CT Score) - Ganglionic level infarction (caudate, lentiform nuclei, internal capsule, insula, M1-M3 cortex): 7 - Supraganglionic infarction (M4-M6 cortex): 3 Total score (0-10 with 10 being normal): 10 IMPRESSION: 1. No acute intracranial abnormality. 2. 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 3. ASPECTS is 10 These results were communicated to Dr. Kerney Elbe at 12:43 am on 08/16/2019 by text page via the [facility] messaging system. Electronically Signed   By: Ulyses Jarred M.D.   On: 08/16/2019 00:44    EKG: Independently reviewed.  Normal sinus rhythm.  Assessment/Plan Principal Problem:   TIA (transient ischemic attack) Active Problems:   PAF (paroxysmal atrial fibrillation) (HCC)   Hypertension    1. TIA versus stroke -appreciate neurology consult.  MRI of the brain and MRA of the brain 2D echo carotid Doppler hemoglobin A1c lipid panel has been ordered.  Aspirin.  Physical therapy consult.  2. History of lung cancer and lymphoma in remission. 3. Normocytic normochromic anemia appears to be chronic.  Follow CBC. 4. A. fib per records presently in sinus rhythm.  Further input from neurology for anticoagulation.   DVT prophylaxis: Lovenox. Code Status: Full code confirmed with patient's wife. Family Communication: Patient's wife. Disposition Plan: Home. Consults called: Neurology. Admission status: Observation.   Rise Patience MD [facility] Pager [phone].  If 7PM-7AM, please contact night-coverage [email or web address] Password [email or web address]  08/16/2019, 1:21 AM

## 2019-08-16 NOTE — ED Triage Notes (Signed)
BIB [facility] from home. Activated code stroke. Pt seen at [facility] today @ 2pm for HA and difficulty ambulating. Received negative CT. Pt experienced dysphasia @ home @ 1900 with worsening weakness as the night progressed. Pt LVO negative on arrival. A&O X 4.

## 2019-08-17 ENCOUNTER — Inpatient Hospital Stay (HOSPITAL_COMMUNITY): Payer: Medicare Other

## 2019-08-17 DIAGNOSIS — I10 Essential (primary) hypertension: Secondary | ICD-10-CM

## 2019-08-17 DIAGNOSIS — R1310 Dysphagia, unspecified: Secondary | ICD-10-CM

## 2019-08-17 MED ORDER — RESOURCE THICKENUP CLEAR PO POWD
ORAL | Status: DC | PRN
  Filled 2019-08-17 (×2): qty 125

## 2019-08-17 MED ORDER — ATORVASTATIN CALCIUM 40 MG PO TABS: 40.0000 mg | ORAL_TABLET | Freq: Every day | ORAL | 0 refills | Status: DC

## 2019-08-17 MED ORDER — ASPIRIN EC 325 MG PO TBEC
325.0000 mg | DELAYED_RELEASE_TABLET | Freq: Every day | ORAL | Status: DC
  Administered 2019-08-17: 15:00:00 325 mg via ORAL
  Filled 2019-08-17: qty 1

## 2019-08-17 MED ORDER — ASPIRIN 325 MG PO TBEC: 325.0000 mg | DELAYED_RELEASE_TABLET | Freq: Every day | ORAL | 0 refills | Status: DC

## 2019-08-17 MED ORDER — CLOPIDOGREL BISULFATE 75 MG PO TABS: 75.0000 mg | ORAL_TABLET | Freq: Every day | ORAL | 0 refills | Status: DC

## 2019-08-17 MED ORDER — RESOURCE THICKENUP CLEAR PO POWD: ORAL | 0 refills | Status: DC

## 2019-08-17 NOTE — Discharge Summary (Addendum)
Physician Discharge Summary  Craig Chapman [id number] DOB: [birth year]  PCP: Lavone Orn, MD  Admitted from: Home Discharged to: Home  Admit date: 08/16/2019 Discharge date: 08/17/2019  Recommendations for Outpatient Follow-up:   Follow-up Information    [facility]. Schedule an appointment as soon as possible for a visit in 4 week(s).   Specialty: Neurology Contact information: [address] [place] [phone]       Lavone Orn, MD. Schedule an appointment as soon as possible for a visit in 1 week(s).   Specialty: Internal Medicine Why: To be seen with repeat labs (CBC & BMP). Contact information: [address] [place] [address] [phone]            Home Health: PT, OT and SLP Equipment/Devices: None  Discharge Condition: Improved and stable CODE STATUS: Full Diet recommendation: Heart healthy diet.  Diet consistency as per speech therapy recommendations as follows:  Recommendations  Diet recommendations: Dysphagia 3 (mechanical soft);Nectar-thick liquid Liquids provided via: Cup;Straw Medication Administration: Whole meds with puree Supervision: Patient able to self feed Compensations: Slow rate;Small sips/bites;Effortful swallow;Follow solids with liquid;Multiple dry swallows after each bite/sip     Discharge Diagnoses:  Principal Problem:   Acute ischemic stroke [facility]) Active Problems:   PAF (paroxysmal atrial fibrillation) ([place])   Hypertension   Brief Summary: 85 year old married male, ambulates with the help of a walker, PMH of lymphoma and lung cancer in remission, s/p left pneumonectomy,?  A. fib not on anticoagulation, HTN, reported stroke with left-sided weakness, uses hearing aids, presented to [facility] ED on 10/8 due to left facial droop, dysarthria, expressive aphasia and drifting to left when ambulating.  He had seen a physician earlier in the day for headache  and CT head was negative.  Developed further symptoms later that day, BP increased to 200/100, had an episode of vomiting on route with bradycardia down to 32 with presyncope which resolved.  Admitted for acute ischemic stroke.  Neurology consulted.   Assessment & Plan:   Acute ischemic stroke: Left lateral medullary infarct most likely secondary to small vessel disease.  OP CT head without contrast 10/7: No acute intracranial pathology.  CT head code stroke 10/7: No acute intracranial abnormality.  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  Brain MRI: Acute left lateral medullary infarct.  Brain MRA: No emergent finding.  Diffuse and advanced atheromatous changes with up to moderate right ICA and distal basilar narrowing.  A1c 5.4  LDL 115.  TTE: LVEF: 60-65%.  Carotid Dopplers: Bilateral ICA 1-39% stenosis, vertebral arteries antegrade flow.  Neurology consultation appreciated.  Stroke MD follow-up pending.  Not on antiplatelets or AC prior to admission, now on aspirin 325 mg daily + Plavix 75 mg daily.  As per Neurology, continue aspirin 325 mg daily + Plavix 75 mg daily for 3 months and then Plavix alone.  Therapies evaluated and recommend home health PT and OT.  Speech therapy evaluated and recommend home health SLP follow-up related to dysphagia as discussed below.  On initial evaluation, given his presentation, it was felt that he would need great than 2 midnights hospital level care for evaluation and management. However, he made quicker than expected recovery and was discharged after 1 midnight.  Hyperlipidemia  LDL 115.  Goal <70.  Started on atorvastatin 40 mg daily, continue.  Follow-up fasting lipids and LFTs as outpatient.  Essential hypertension  Due to recent acute ischemic stroke, allow permissive hypertension  and treat only if BP consistently >220/120.  Not on antihypertensives PTA.  Blood pressures soft but asymptomatic this  afternoon.  ?  Paroxysmal A. Fib  Not on rate control medications or anticoagulation PTA.  Patient has remained in sinus rhythm in the hospital.  Patient and wife deny history of A. fib.  EKG sinus rhythm.  No A. fib on telemetry.  Neurology has reviewed records and no A. fib documented.  Thereby they feel that his stroke is most likely due to small vessel disease and the stroke is not of embolic pattern either.  Dysphagia  Secondary to CVA/lateral medullary syndrome.  Since patient has 1 functioning lung due to prior left pneumonectomy, large aspiration can be detrimental to his respiratory status.  Thereby patient was made NPO overnight until swallow evaluation.  Speech therapy performed MBS and have placed him on dysphagia 3 and nectar thickened liquids with strict aspiration precautions.  Patient and family have been counseled by SLP regarding management.  Home health SLP follow-up.  Chest x-ray 10/8 reviewed and apart from chronic left-sided whiteout from prior pneumonectomy, right lung fields clear and without acute findings.  Prolonged QTC  EKG on admission showed QTC of 517 ms.  Appears to be chronic.  Monitor on telemetry, no arrhythmias.  Avoid QT prolonging medications.  Keep potassium >4 and magnesium >2.  Repeat EKG today: QTC 459 ms.  Resolved.  History of lung cancer and lymphoma  Said to be in remission.  Hard of hearing  Uses hearing aids.  Overweight/Body mass index is 29.15 kg/m.    Consultants:  Neurology  Procedures:  TTE 08/16/2019: IMPRESSIONS    1. Left ventricular ejection fraction, by visual estimation, is 60 to 65%. The left ventricle has normal function. Normal left ventricular size. There is mildly increased left ventricular basal septaly hypertrophy.  2. Left ventricular diastolic Doppler parameters are consistent with pseudonormalization pattern of LV diastolic fillingg.  3. Elevated left ventricular end-diastolic  pressure.  4. Global right ventricle has normal systolic function.The right ventricular size is normal. No increase in right ventricular wall thickness.  5. Left atrial size was mildly dilated.  6. Right atrial size was mildly dilated.  7. Mild calcification of the anterior mitral valve leaflet(s).  8. The mitral valve is normal in structure. Mild mitral valve regurgitation. No evidence of mitral stenosis.  9. The tricuspid valve is normal in structure. Tricuspid valve regurgitation was not visualized by color flow Doppler. 10. The aortic valve was not well visualized Aortic valve regurgitation was not visualized by color flow Doppler. Moderate aortic valve sclerosis/calcification with mild aortic stenosis. Aortic valve mean gradient measures 12.0 mmHg. Aortic valve peak  gradient measures 22.7 mmHg. Aortic valve area, by VTI measures 1.41 cm. 11. The pulmonic valve was normal in structure. Pulmonic valve regurgitation is not visualized by color flow Doppler. 12. The inferior vena cava is dilated in size with <50% respiratory variability, suggesting right atrial pressure of 15 mmHg. 13. TR signal is inadequate for assessing pulmonary artery systolic pressure.   Discharge Instructions  Discharge Instructions    Ambulatory referral to Neurology   Complete by: As directed    Follow up with stroke clinic NP (Jessica Vanschaick or Cecille Rubin, if both not available, consider Zachery Dauer, or A[place]) at [facility] in about 4 weeks. Thanks.   Call MD for:   Complete by: As directed    Recurrent strokelike symptoms.   Call MD for:  difficulty breathing, headache or visual disturbances  Complete by: As directed    Call MD for:  extreme fatigue   Complete by: As directed    Call MD for:  persistant dizziness or light-headedness   Complete by: As directed    Call MD for:  persistant nausea and vomiting   Complete by: As directed    Call MD for:  severe uncontrolled pain   Complete by: As  directed    Call MD for:  temperature >100.4   Complete by: As directed    Diet - low sodium heart healthy   Complete by: As directed    Discharge instructions   Complete by: As directed    Diet consistency as per speech therapy recommendations as follows:  Recommendations  Diet recommendations: Dysphagia 3 (mechanical soft);Nectar-thick liquid Liquids provided via: Cup;Straw Medication Administration: Whole meds with puree Supervision: Patient able to self feed Compensations: Slow rate;Small sips/bites;Effortful swallow;Follow solids with liquid;Multiple dry swallows after each bite/sip   Driving Restrictions   Complete by: As directed    No driving for 6 months or until advised by your physician during office visit.   Increase activity slowly   Complete by: As directed        Medication List    TAKE these medications   aspirin 325 MG EC tablet Take 1 tablet (325 mg total) by mouth daily. Start taking on: August 18, 2019   atorvastatin 40 MG tablet Commonly known as: LIPITOR Take 1 tablet (40 mg total) by mouth daily at 6 PM.   clopidogrel 75 MG tablet Commonly known as: PLAVIX Take 1 tablet (75 mg total) by mouth daily. Start taking on: August 18, 2019   Resource ThickenUp Clear Powd Oral, As needed      Allergies  Allergen Reactions  . Sulfa Antibiotics Hives      Procedures/Studies: Ct Head Wo Contrast  Result Date: 08/15/2019 CLINICAL DATA:  History of TIAs, headache EXAM: CT HEAD WITHOUT CONTRAST TECHNIQUE: Contiguous axial images were obtained from the base of the skull through the vertex without intravenous contrast. COMPARISON:  08/27/2009 FINDINGS: Brain: No evidence of acute infarction, hemorrhage, extra-axial collection, ventriculomegaly, or mass effect. Old right basal ganglia lacunar infarct. Generalized cerebral atrophy. Periventricular white matter low attenuation likely secondary to microangiopathy. Vascular: Cerebrovascular atherosclerotic  calcifications are noted. Skull: Negative for fracture or focal lesion. Sinuses/Orbits: Visualized portions of the orbits are unremarkable. Visualized portions of the paranasal sinuses and mastoid air cells are unremarkable. Other: None. IMPRESSION: 1. No acute intracranial pathology. 2. Chronic microvascular disease and cerebral atrophy. Electronically Signed   By: Kathreen Devoid   On: 08/15/2019 16:04   Mr Angio Head Wo Contrast  Result Date: 08/16/2019 CLINICAL DATA:  Slurred speech and left facial droop EXAM: MRI HEAD WITHOUT CONTRAST MRA HEAD WITHOUT CONTRAST TECHNIQUE: Multiplanar, multiecho pulse sequences of the brain and surrounding structures were obtained without intravenous contrast. Angiographic images of the head were obtained using MRA technique without contrast. COMPARISON:  Head CT from earlier today FINDINGS: MRI HEAD FINDINGS Brain: Small area of acute infarction in the lateral left medulla. Remote small vessel infarcts in the right cerebellum, bilateral deep gray nuclei, and bilateral deep white matter tracts. Confluent ischemic gliosis in the deep cerebral white matter. Small remote left parietal and anterior right frontal cortex based infarction. No hemorrhage, hydrocephalus, or masslike finding. Vascular: Normal flow voids Skull and upper cervical spine: Nodes negative for marrow lesion Sinuses/Orbits: Right maxillary sinusitis with mucosal thickening and fluid. This at least partially chronic given  sinus atelectasis MRA HEAD FINDINGS Extensive atheromatous irregularity of bilateral vertebral, carotid, and basilar arteries. There is up to moderate narrowing of the right ICA at the supraclinoid segment. Mild-to-moderate distal basilar narrowing. Branch vessels show diffuse atheromatous type irregularity without major branch occlusion. Apparent outpouchings inferiorly at the vertebrobasilar junction, leftward from the basilar, and superiorly at the paraclinoid right ICA segment likely  atheromatous, especially the former given location. IMPRESSION: Brain MRI: 1. Acute left lateral medullary infarct. 2. Advanced chronic small vessel ischemia with numerous remote small vessel infarcts. 3. Right maxillary sinusitis. Intracranial MRA: 1. No emergent finding. 2. Diffuse and advanced atheromatous changes with up to moderate right ICA and distal basilar narrowing. Electronically Signed   By: Monte Fantasia M.D.   On: 08/16/2019 04:58   Mr Brain Wo Contrast  Result Date: 08/16/2019 CLINICAL DATA:  Slurred speech and left facial droop EXAM: MRI HEAD WITHOUT CONTRAST MRA HEAD WITHOUT CONTRAST TECHNIQUE: Multiplanar, multiecho pulse sequences of the brain and surrounding structures were obtained without intravenous contrast. Angiographic images of the head were obtained using MRA technique without contrast. COMPARISON:  Head CT from earlier today FINDINGS: MRI HEAD FINDINGS Brain: Small area of acute infarction in the lateral left medulla. Remote small vessel infarcts in the right cerebellum, bilateral deep gray nuclei, and bilateral deep white matter tracts. Confluent ischemic gliosis in the deep cerebral white matter. Small remote left parietal and anterior right frontal cortex based infarction. No hemorrhage, hydrocephalus, or masslike finding. Vascular: Normal flow voids Skull and upper cervical spine: Nodes negative for marrow lesion Sinuses/Orbits: Right maxillary sinusitis with mucosal thickening and fluid. This at least partially chronic given sinus atelectasis MRA HEAD FINDINGS Extensive atheromatous irregularity of bilateral vertebral, carotid, and basilar arteries. There is up to moderate narrowing of the right ICA at the supraclinoid segment. Mild-to-moderate distal basilar narrowing. Branch vessels show diffuse atheromatous type irregularity without major branch occlusion. Apparent outpouchings inferiorly at the vertebrobasilar junction, leftward from the basilar, and superiorly at the  paraclinoid right ICA segment likely atheromatous, especially the former given location. IMPRESSION: Brain MRI: 1. Acute left lateral medullary infarct. 2. Advanced chronic small vessel ischemia with numerous remote small vessel infarcts. 3. Right maxillary sinusitis. Intracranial MRA: 1. No emergent finding. 2. Diffuse and advanced atheromatous changes with up to moderate right ICA and distal basilar narrowing. Electronically Signed   By: Monte Fantasia M.D.   On: 08/16/2019 04:58   Dg Chest Port 1 View  Result Date: 08/16/2019 CLINICAL DATA:  Cough.  History of lung cancer. EXAM: PORTABLE CHEST 1 VIEW COMPARISON:  05/14/2017 FINDINGS: Stable postsurgical change with volume loss and complete opacification of the left hemithorax. Right lung adequately inflated and otherwise clear. Remainder of the exam is unchanged. IMPRESSION: No acute disease. Stable chronic volume loss with postsurgical change and complete opacification of the left hemithorax. Electronically Signed   By: Marin Olp M.D.   On: 08/16/2019 18:09   Dg Swallowing Func-speech Pathology  Result Date: 08/17/2019 Objective Swallowing Evaluation: Type of Study: MBS-Modified Barium Swallow Study  Patient Details Name: Craig Chapman MRN: [id number] Date of Birth: [birth year] Today's Date: 08/17/2019 Time: SLP Start Time (ACUTE ONLY): 0945 -SLP Stop Time (ACUTE ONLY): 1019 SLP Time Calculation (min) (ACUTE ONLY): 34 min Past Medical History: Past Medical History: Diagnosis Date . Arthritis  . Atrial fibrillation ([place])  . History of lung or bronchial cancer  . Hypertension  . Lymphoma ([place])  . TIA (transient ischemic attack)  Past Surgical History: Past  Surgical History: Procedure Laterality Date . BACK SURGERY   . CHOLECYSTECTOMY   . HERNIA REPAIR   . ORIF FEMUR FRACTURE Right 05/15/2017  Procedure: OPEN REDUCTION INTERNAL FIXATION (ORIF) DISTAL FEMUR FRACTURE;  Surgeon: Newt Minion, MD;  Location: [place];  Service: Orthopedics;  Laterality: Right;  . PNEUMONECTOMY   . REPLACEMENT TOTAL KNEE BILATERAL   . ROTATOR CUFF REPAIR   HPI: Craig Chapman is a 85 y.o. 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 Noted to ahve dysphagia with meals. Only one functioning lung.  No data recorded Assessment / Plan / Recommendation CHL IP CLINICAL IMPRESSIONS 08/17/2019 Clinical Impression  Pt demonstrates a moderate oral and oropharyngeal dysphagia. Oral deficits include difficulty initiating oral transit with prolonged lingual rocking, increased time when trying to attempt a swallow strategy. Oropharyngeal impairment included intermittent late laryngeal closure and decreased cricopharyngeal relaxation (prominent CP noted) and decreased duration and strength of pharyngeal peristalsis with moderate residuals. Pt has intermittent sensed minimal to moderate aspiration events with thin liquids and potential for aspiration of residue post swallow given no awareness of pharyngeal residue.  Attempted chin tuck and head turn left and right with all textures. None significantly reduced residue more than a simple cue for an effortful swallow (pt did not quite achieve prolonged elevation with cues, but has potential). Though residue with thin liquids was less severe than with nectar, thin liquids were at times aspirated before laryngeal closure (sensed with a hard cough ?full ejection). Overall, the best diet and strategy for this pt, for the time being, is nectar thick liquids and soft moist solids with an effortful swallow x2, alternating solids and liquids. Pt needs emphasis on oral hygiene and f/u with SLP for a more efficient Mendehlson Maneuver and further pharyngeal  strengthening exercises.  SLP Visit Diagnosis -- Attention and concentration deficit following -- Frontal lobe and executive function deficit following -- Impact on safety and function --   CHL IP TREATMENT RECOMMENDATION 08/17/2019 Treatment Recommendations Therapy as outlined in treatment plan below   Prognosis 08/17/2019 Prognosis for Safe Diet Advancement Good Barriers to Reach Goals -- Barriers/Prognosis Comment -- CHL IP DIET RECOMMENDATION 08/17/2019 SLP Diet Recommendations Dysphagia 3 (Mech soft) solids;Nectar thick liquid Liquid Administration via Cup;Straw Medication Administration Crushed with puree Compensations Slow rate;Small sips/bites;Effortful swallow;Follow solids with liquid;Multiple dry swallows after each bite/sip Postural Changes Seated upright at 90 degrees   CHL IP OTHER RECOMMENDATIONS 08/17/2019 Recommended Consults -- Oral Care Recommendations Patient independent with oral care;Oral care before and after PO Other Recommendations --   CHL IP FOLLOW UP RECOMMENDATIONS 08/17/2019 Follow up Recommendations Home health SLP   CHL IP FREQUENCY AND DURATION 08/17/2019 Speech Therapy Frequency (ACUTE ONLY) min 2x/week Treatment Duration 2 weeks      CHL IP ORAL PHASE 08/17/2019 Oral Phase Impaired Oral - Pudding Teaspoon -- Oral - Pudding Cup -- Oral - Honey Teaspoon -- Oral - Honey Cup -- Oral - Nectar Teaspoon -- Oral - Nectar Cup Delayed oral transit;Lingual pumping Oral - Nectar Straw Delayed oral transit;Lingual pumping Oral - Thin Teaspoon -- Oral - Thin Cup Delayed oral transit;Lingual pumping Oral - Thin Straw Delayed oral transit;Lingual pumping Oral - Puree Delayed oral  transit;Lingual pumping Oral - Mech Soft Delayed oral transit;Lingual pumping Oral - Regular -- Oral - Multi-Consistency -- Oral - Pill -- Oral Phase - Comment --  CHL IP PHARYNGEAL PHASE 08/17/2019 Pharyngeal Phase Impaired Pharyngeal- Pudding Teaspoon -- Pharyngeal -- Pharyngeal- Pudding Cup -- Pharyngeal -- Pharyngeal- Honey  Teaspoon -- Pharyngeal -- Pharyngeal- Honey Cup -- Pharyngeal -- Pharyngeal- Nectar Teaspoon -- Pharyngeal -- Pharyngeal- Nectar Cup Reduced anterior laryngeal mobility;Reduced laryngeal elevation;Pharyngeal residue - valleculae;Pharyngeal residue - pyriform;Pharyngeal residue - cp segment;Compensatory strategies attempted (with notebox) Pharyngeal -- Pharyngeal- Nectar Straw Reduced anterior laryngeal mobility;Reduced laryngeal elevation;Pharyngeal residue - valleculae;Pharyngeal residue - pyriform;Pharyngeal residue - cp segment;Compensatory strategies attempted (with notebox) Pharyngeal -- Pharyngeal- Thin Teaspoon -- Pharyngeal -- Pharyngeal- Thin Cup Reduced anterior laryngeal mobility;Reduced laryngeal elevation;Pharyngeal residue - valleculae;Pharyngeal residue - pyriform;Pharyngeal residue - cp segment;Compensatory strategies attempted (with notebox);Penetration/Aspiration before swallow;Trace aspiration Pharyngeal Material enters airway, passes BELOW cords then ejected out;Material does not enter airway Pharyngeal- Thin Straw Reduced anterior laryngeal mobility;Reduced laryngeal elevation;Pharyngeal residue - valleculae;Pharyngeal residue - pyriform;Pharyngeal residue - cp segment;Compensatory strategies attempted (with notebox);Penetration/Aspiration before swallow;Moderate aspiration Pharyngeal Material enters airway, passes BELOW cords then ejected out;Material does not enter airway;Material enters airway, passes BELOW cords and not ejected out despite cough attempt by patient Pharyngeal- Puree Reduced anterior laryngeal mobility;Reduced laryngeal elevation;Pharyngeal residue - valleculae;Pharyngeal residue - pyriform;Pharyngeal residue - cp segment;Compensatory strategies attempted (with notebox) Pharyngeal -- Pharyngeal- Mechanical Soft -- Pharyngeal -- Pharyngeal- Regular -- Pharyngeal -- Pharyngeal- Multi-consistency -- Pharyngeal -- Pharyngeal- Pill -- Pharyngeal -- Pharyngeal Comment --  CHL IP  CERVICAL ESOPHAGEAL PHASE 08/17/2019 Cervical Esophageal Phase Impaired Pudding Teaspoon -- Pudding Cup -- Honey Teaspoon -- Honey Cup -- Nectar Teaspoon -- Nectar Cup -- Nectar Straw -- Thin Teaspoon -- Thin Cup -- Thin Straw -- Puree -- Mechanical Soft -- Regular -- Multi-consistency -- Pill -- Cervical Esophageal Comment Prominent CP, decreased opening, mild backflow from CP to pyriforms post swallow Craig Baltimore, MA CCC-SLP Acute Rehabilitation Services Pager [phone] Office [phone] Lynann Beaver 08/17/2019, 10:39 AM              Ct Head Code Stroke Wo Contrast  Result Date: 08/16/2019 CLINICAL DATA:  Code stroke. Slurred speech and left facial droop with left-sided weakness EXAM: CT HEAD WITHOUT CONTRAST TECHNIQUE: Contiguous axial images were obtained from the base of the skull through the vertex without intravenous contrast. COMPARISON:  None. FINDINGS: Brain: There is no mass, hemorrhage or extra-axial collection. There is generalized atrophy without lobar predilection. Areas of hypoattenuation of the deep gray nuclei and confluent periventricular white matter hypodensity, consistent with chronic small vessel disease. There are multiple old infarcts. Vascular: No abnormal hyperdensity of the major intracranial arteries or dural venous sinuses. No intracranial atherosclerosis. Skull: The visualized skull base, calvarium and extracranial soft tissues are normal. Sinuses/Orbits: No fluid levels or advanced mucosal thickening of the visualized paranasal sinuses. No mastoid or middle ear effusion. The orbits are normal. ASPECTS [facility] Stroke Program Early CT Score) - Ganglionic level infarction (caudate, lentiform nuclei, internal capsule, insula, M1-M3 cortex): 7 - Supraganglionic infarction (M4-M6 cortex): 3 Total score (0-10 with 10 being normal): 10 IMPRESSION: 1. No acute intracranial abnormality. 2. 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 3. ASPECTS is  10 These results were communicated to Dr. Kerney Elbe at 12:43 am on 08/16/2019 by text page via the [facility] messaging system. Electronically Signed   By: Ulyses Jarred M.D.   On: 08/16/2019 00:44   Vas  US Carotid (at [place] Only)  Result Date: 08/16/2019 Carotid Arterial Duplex Study Indications: TIA. Performing Technologist: Antonieta Pert RDMS, RVT  Examination Guidelines: A complete evaluation includes B-mode imaging, spectral Doppler, color Doppler, and power Doppler as needed of all accessible portions of each vessel. Bilateral testing is considered an integral part of a complete examination. Limited examinations for reoccurring indications may be performed as noted.  Right Carotid Findings: +----------+--------+--------+--------+-------------------------------+--------+           PSV cm/sEDV cm/sStenosisPlaque Description             Comments +----------+--------+--------+--------+-------------------------------+--------+ CCA Prox  65      12                                                      +----------+--------+--------+--------+-------------------------------+--------+ CCA Distal64      19              diffuse, hyperechoic and                                                  heterogenous                            +----------+--------+--------+--------+-------------------------------+--------+ ICA Prox  62      11              diffuse and heterogenous                +----------+--------+--------+--------+-------------------------------+--------+ ICA Distal66      22                                                      +----------+--------+--------+--------+-------------------------------+--------+ ECA       87      10                                                      +----------+--------+--------+--------+-------------------------------+--------+ +----------+--------+-------+----------------+-------------------+           PSV cm/sEDV  cmsDescribe        Arm Pressure (mmHG) +----------+--------+-------+----------------+-------------------+ [id number]            Multiphasic, WNL                    +----------+--------+-------+----------------+-------------------+ +---------+--------+--+--------+--+---------+ VertebralPSV cm/s58EDV cm/s13Antegrade +---------+--------+--+--------+--+---------+ Right vertebral VERY difficult to evaluate. Left Carotid Findings: +----------+--------+--------+--------+------------------+--------+           PSV cm/sEDV cm/sStenosisPlaque DescriptionComments +----------+--------+--------+--------+------------------+--------+ CCA Prox  77      14                                         +----------+--------+--------+--------+------------------+--------+ CCA Distal68      12                                         +----------+--------+--------+--------+------------------+--------+  ICA Prox  71      19              focal and calcific         +----------+--------+--------+--------+------------------+--------+ ICA Distal73      18                                         +----------+--------+--------+--------+------------------+--------+ ECA       124     9                                          +----------+--------+--------+--------+------------------+--------+ +----------+--------+--------+----------------+-------------------+           PSV cm/sEDV cm/sDescribe        Arm Pressure (mmHG) +----------+--------+--------+----------------+-------------------+ [id number]              Multiphasic, WNL                    +----------+--------+--------+----------------+-------------------+ +---------+--------+--+--------+--+---------+ VertebralPSV cm/s38EDV cm/s13Antegrade +---------+--------+--+--------+--+---------+  Summary: Right Carotid: Velocities in the right ICA are consistent with a 1-39% stenosis. Left Carotid: Velocities in the left ICA are  consistent with a 1-39% stenosis. Vertebrals:  Bilateral vertebral arteries demonstrate antegrade flow. Subclavians: Normal flow hemodynamics were seen in bilateral subclavian              arteries. *See table(s) above for measurements and observations.  Electronically signed by Monica Martinez MD on 08/16/2019 at 5:17:14 PM.    Final       Subjective: Patient denies complaints.  Overall feels much better.  Denies asymmetrical limb weakness or tingling.  Feels that his speech has returned to normal.  Denies cough, dyspnea or chest pain.  As per RN, no acute issues reported.  Patient denies dizziness or lightheadedness.  Discharge Exam:  Vitals:   08/17/19 0342 08/17/19 0919 08/17/19 1316 08/17/19 1355  BP: (!) 151/75 (!) 124/55 (!) 98/54 105/60  Pulse: 65 62 65   Resp: 18 16 (!) 22   Temp: 98.5 F (36.9 C) 97.6 F (36.4 C) 98.2 F (36.8 C)   TempSrc: Oral Oral Oral   SpO2: 95% 99% 99%   Weight:      Height:        General exam: Pleasant elderly male, moderately built and overweight sitting up comfortably in reclining chair this morning. Respiratory system: Clear to auscultation. Respiratory effort normal. Cardiovascular system: S1 & S2 heard, RRR. No JVD, murmurs, rubs, gallops or clicks. No pedal edema.  Telemetry personally reviewed: Sinus rhythm. Gastrointestinal system: Abdomen is nondistended, soft and nontender. No organomegaly or masses felt. Normal bowel sounds heard. Central nervous system: Alert and oriented.  Dysarthria and facial asymmetry seem to have almost resolved.  No other cranial nerve deficits noted.  Has chronic bilateral hearing loss. Extremities:  Symmetric 5 x 5 power.  No pronator drift.  Right knee healed surgical scar.   Skin: No rashes, lesions or ulcers Psychiatry: Judgement and insight appear intact. Mood & affect appropriate.    The results of significant diagnostics from this hospitalization (including imaging, microbiology, ancillary and  laboratory) are listed below for reference.     Microbiology: Recent Results (from the past 240 hour(s))  SARS CORONAVIRUS 2 (TAT 6-24 HRS) Nasopharyngeal Nasopharyngeal Swab     Status: None  Collection Time: 08/16/19  1:18 AM   Specimen: Nasopharyngeal Swab  Result Value Ref Range Status   SARS Coronavirus 2 NEGATIVE NEGATIVE Final    Comment: (NOTE) SARS-CoV-2 target nucleic acids are NOT DETECTED. The SARS-CoV-2 RNA is generally detectable in upper and lower respiratory specimens during the acute phase of infection. Negative results do not preclude SARS-CoV-2 infection, do not rule out co-infections with other pathogens, and should not be used as the sole basis for treatment or other patient management decisions. Negative results must be combined with clinical observations, patient history, and epidemiological information. The expected result is Negative. Fact Sheet for Patients: SugarRoll.be Fact Sheet for Healthcare Providers: https://www.woods-mathews.com/ This test is not yet approved or cleared by the [place] FDA and  has been authorized for detection and/or diagnosis of SARS-CoV-2 by FDA under an Emergency Use Authorization (EUA). This EUA will remain  in effect (meaning this test can be used) for the duration of the COVID-19 declaration under Section 56 4(b)(1) of the Act, 21 U.S.C. section 360bbb-3(b)(1), unless the authorization is terminated or revoked sooner. Performed at [facility], [place] [address] [place], [place] [address]      Labs: CBC: Recent Labs  Lab 08/16/19 0028 08/16/19 0035 08/16/19 0505  WBC 7.1  --  5.9  NEUTROABS 4.4  --   --   HGB 12.8* 12.9* 12.6*  HCT 38.8* 38.0* 37.8*  MCV 99.5  --  99.7  PLT 172  --  [id number]   Basic Metabolic Panel: Recent Labs  Lab 08/16/19 0028 08/16/19 0035 08/16/19 0505 08/16/19 0506  NA 136 137  --   --   K 3.7 3.6  --   --   CL 103 101  --   --    CO2 25  --   --   --   GLUCOSE 151* 142*  --   --   BUN 17 18  --   --   CREATININE 1.10 1.00 0.95  --   CALCIUM 8.9  --   --   --   MG  --   --   --  2.2   Liver Function Tests: Recent Labs  Lab 08/16/19 0028  AST 51*  ALT 37  ALKPHOS 50  BILITOT 0.6  PROT 7.3  ALBUMIN 3.6   BNP (last 3 results) No results for input(s): BNP in the last 8760 hours. Cardiac Enzymes: No results for input(s): CKTOTAL, CKMB, CKMBINDEX, TROPONINI in the last 168 hours. CBG: Recent Labs  Lab 08/16/19 0029  GLUCAP 142*   Hgb A1c Recent Labs    08/16/19 0505  HGBA1C 5.4   Lipid Profile Recent Labs    08/16/19 0506  CHOL 174  HDL 38*  LDLCALC 115*  TRIG 107  CHOLHDL 4.6   Thyroid function studies No results for input(s): TSH, T4TOTAL, T3FREE, THYROIDAB in the last 72 hours.  Invalid input(s): FREET3 Anemia work up No results for input(s): VITAMINB12, FOLATE, FERRITIN, TIBC, IRON, RETICCTPCT in the last 72 hours. Urinalysis    Component Value Date/Time   COLORURINE YELLOW 08/16/2019 0028   APPEARANCEUR HAZY (A) 08/16/2019 0028   LABSPEC 1.021 08/16/2019 0028   PHURINE 5.0 08/16/2019 0028   GLUCOSEU NEGATIVE 08/16/2019 0028   HGBUR NEGATIVE 08/16/2019 0028   BILIRUBINUR NEGATIVE 08/16/2019 0028   KETONESUR NEGATIVE 08/16/2019 0028   PROTEINUR NEGATIVE 08/16/2019 0028   UROBILINOGEN 0.2 02/15/2015 1513   NITRITE NEGATIVE 08/16/2019 0028   LEUKOCYTESUR NEGATIVE 08/16/2019 0028  I discussed in detail with patient's spouse, updated care and answered questions.  She was appreciative.  Time coordinating discharge: 40 minutes  SIGNED:  Vernell Leep, MD, FACP, [facility]. [facility]  To contact the attending provider between 7A-7P or the covering provider during after hours 7P-7A, please log into the web site [email or web address] and access using universal [place] password for that web site. If you do not have the password, please call the hospital operator.

## 2019-08-17 NOTE — Progress Notes (Signed)
Modified Barium Swallow Progress Note  Patient Details  Name: Craig Chapman MRN: [id number] Date of Birth: 10-02-1928  Today's Date: 08/17/2019  Modified Barium Swallow completed.  Full report located under Chart Review in the Imaging Section.  Brief recommendations include the following:  Clinical Impression  Pt demonstrates a moderate oral and oropharyngeal dysphagia. Oral deficits include difficulty initiating oral transit with prolonged lingual rocking, increased time when trying to attempt a swallow strategy. Oropharyngeal impairment included intermittent late laryngeal closure and decreased cricopharyngeal relaxation (prominent CP noted) and decreased duration and strength of pharyngeal peristalsis with moderate residuals. Pt has intermittent sensed minimal to moderate aspiration events with thin liquids and potential for aspiration of residue post swallow given no awareness of pharyngeal residue.   Attempted chin tuck and head turn left and right with all textures. None significantly reduced residue more than a simple cue for an effortful swallow (pt did not quite achieve prolonged elevation with cues, but has potential). Though residue with thin liquids was less severe than with nectar, thin liquids were at times aspirated before laryngeal closure (sensed with a hard cough ?full ejection). Overall, the best diet and strategy for this pt, for the time being, is nectar thick liquids and soft moist solids with an effortful swallow x2, alternating solids and liquids. Pt needs emphasis on oral hygiene and f/u with SLP for a more efficient [place]son Maneuver and further pharyngeal strengthening exercises.    Swallow Evaluation Recommendations       SLP Diet Recommendations: Dysphagia 3 (Mech soft) solids;Nectar thick liquid   Liquid Administration via: Cup;Straw   Medication Administration: Crushed with puree   Supervision: Patient able to self feed   Compensations: Slow rate;Small  sips/bites;Effortful swallow;Follow solids with liquid;Multiple dry swallows after each bite/sip   Postural Changes: Seated upright at 90 degrees   Oral Care Recommendations: Patient independent with oral care;Oral care before and after PO        Lynel Forester, Katherene Ponto 08/17/2019,10:38 AM

## 2019-08-17 NOTE — TOC Transition Note (Signed)
Transition of Care [facility]) - CM/SW Discharge Note   Patient Details  Name: Craig Chapman MRN: [id number] Date of Birth: 1928/10/26  [facility] CM/SW Contact:  Geralynn Ochs, LCSW Phone Number: 08/17/2019, 3:23 PM   Clinical Narrative:   Patient going home with home health services. Setup with Alvis Lemmings for PT, OT, and SLP. No equipment needs at this time, has all at home.     Final next level of care: [place] Barriers to Discharge: Barriers Resolved   Patient Goals and CMS Choice Patient states their goals for this hospitalization and ongoing recovery are:: to have my leg be more useful CMS Medicare.gov Compare Post Acute Care list provided to:: Patient Choice offered to / list presented to : Patient  Discharge Placement                       Discharge Plan and Services     Post Acute Care Choice: Home Health                    HH Arranged: PT, OT, Speech Therapy HH Agency: [place] Date [place]: 08/17/19 Time [place]: 1522 Representative spoke with at [place]: [employer] ([place]) Interventions     Readmission Risk Interventions No flowsheet data found.

## 2019-08-17 NOTE — Discharge Instructions (Addendum)

## 2019-08-17 NOTE — Progress Notes (Signed)
  Speech Language Pathology Treatment: Dysphagia  Patient Details Name: Craig Chapman MRN: [id number] DOB: [birth year] Today's Date: 08/17/2019 Time: [id number] SLP Time Calculation (min) (ACUTE ONLY): 20 min  Assessment / Plan / Recommendation Clinical Impression  Provided extensive verbal and written discussion of dysphagia, aspiration risk, diet recommendations, compensatory strategies, need for oral hygiene. Pt not quite accepting of dx, wife very receptive. Pt makes lots of alternative reasons for overt signs of aspiration. Tried to emphasize importance of recommendations. Pt will need home health f/u ASAP.    HPI HPI: Craig Chapman is a 85 y.o. 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 Noted to ahve dysphagia with meals. Only one functioning lung.      SLP Plan  Continue with current plan of care       Recommendations  Diet recommendations: Dysphagia 3 (mechanical soft);Nectar-thick liquid Liquids provided via: Cup;Straw Medication Administration: Whole meds with puree Supervision: Patient able to self feed Compensations: Slow rate;Small sips/bites;Effortful swallow;Follow solids with liquid;Multiple dry swallows after each bite/sip                Follow up Recommendations: Home health SLP Plan: Continue with current plan of care       GO              Herbie [place], MA [place] Pager [phone] Office [phone]   Lynann Beaver 08/17/2019, 11:24 AM

## 2019-08-17 NOTE — Progress Notes (Signed)
Physical Therapy Treatment Patient Details Name: Craig Chapman MRN: [id number] DOB: 23-Mar-1928 Today's Date: 08/17/2019    History of Present Illness Pt is an 85 y/o male admitted secondary to expressive aphasia and L facial droop. MRI negative for L lateral medullary infarct. PMH includes lymphoma, lung cancer, HTN, a fib, and CVA with residual L weakness.     PT Comments    Pt tolerated treatment well, ambulating for increased distances and performing OOB activity with reduced physical assistance from PT. Pt fatigues relatively quickly during OOB activity and was unable to progress to stair negotiation during this session. Pt does not demonstrate and LOB during session when utilizing RW. Pt reports he does not feel back to baseline, however he does feel comfortable discharging home with assistance from spouse and home health PT. Pt will benefit from continued PT POC to progress upon his current deficits and reduce falls risk until discharge is complete.   Follow Up Recommendations  Home health PT;Supervision/Assistance - 24 hour     Equipment Recommendations  None recommended by PT    Recommendations for Other Services       Precautions / Restrictions Precautions Precautions: Fall Restrictions Weight Bearing Restrictions: No    Mobility  Bed Mobility Overal bed mobility: Needs Assistance Bed Mobility: Supine to Sit     Supine to sit: Supervision     General bed mobility comments: supervision for sup to sit with use of bed rail  Transfers Overall transfer level: Needs assistance Equipment used: Rolling walker (2 wheeled) Transfers: Sit to/from Stand Sit to Stand: Min assist;Min guard         General transfer comment: minA from bed and chair without arm rests. minG from recliner chair with arm rests.  Ambulation/Gait Ambulation/Gait assistance: Min guard Gait Distance (Feet): 100 Feet(2 other trials of 25 and 15 feet) Assistive device: Rolling walker (2  wheeled) Gait Pattern/deviations: Step-through pattern;Trunk flexed Gait velocity: reduced Gait velocity interpretation: <1.8 ft/sec, indicate of risk for recurrent falls General Gait Details: Pt with slowed step through gait, L foot dragging occasionally with fatigue. PT providing cues to maintain RW closer to BOS and reduce forward trunk flexion   Stairs Stairs: (pt fatigued and declining stair negotiation at this time)           Wheelchair Mobility    Modified Rankin (Stroke Patients Only) Modified Rankin (Stroke Patients Only) Pre-Morbid Rankin Score: No significant disability Modified Rankin: Moderately severe disability     Balance Overall balance assessment: Needs assistance Sitting-balance support: Feet supported Sitting balance-Leahy Scale: Fair Sitting balance - Comments: supervision of 1 person with single UE support   Standing balance support: Bilateral upper extremity supported Standing balance-Leahy Scale: Poor Standing balance comment: minG for static standing balance                            Cognition Arousal/Alertness: Awake/alert Behavior During Therapy: WFL for tasks assessed/performed Overall Cognitive Status: No family/caregiver present to determine baseline cognitive functioning                                 General Comments: slowed processing, intermittent inappropriate questions/responses based on conversation, difficult to determine if this is baseline or acute      Exercises      General Comments        Pertinent Vitals/Pain Pain Assessment: No/denies pain  Home Living                      Prior Function            PT Goals (current goals can now be found in the care plan section) Acute Rehab PT Goals Patient Stated Goal: to go home Progress towards PT goals: Progressing toward goals    Frequency    Min 4X/week      PT Plan Current plan remains appropriate    Co-evaluation               AM-PAC PT "6 Clicks" Mobility   Outcome Measure  Help needed turning from your back to your side while in a flat bed without using bedrails?: None Help needed moving from lying on your back to sitting on the side of a flat bed without using bedrails?: None Help needed moving to and from a bed to a chair (including a wheelchair)?: A Little Help needed standing up from a chair using your arms (e.g., wheelchair or bedside chair)?: A Little Help needed to walk in hospital room?: A Little Help needed climbing 3-5 steps with a railing? : A Lot 6 Click Score: 19    End of Session Equipment Utilized During Treatment: Gait belt Activity Tolerance: Patient limited by fatigue Patient left: in chair;with call bell/phone within reach;with chair alarm set Nurse Communication: Mobility status PT Visit Diagnosis: Unsteadiness on feet (R26.81)     Time: [id number] PT Time Calculation (min) (ACUTE ONLY): 38 min  Charges:  $Gait Training: 23-37 mins $Therapeutic Activity: 8-22 mins                     Zenaida Niece, PT, DPT Acute Rehabilitation Pager: [phone]    Zenaida Niece 08/17/2019, 8:52 AM

## 2019-08-17 NOTE — Evaluation (Signed)
Occupational Therapy Evaluation Patient Details Name: SAMSON MARKET MRN: [id number] DOB: 03/13/1928 Today's Date: 08/17/2019    History of Present Illness Pt is an 85 y/o male admitted secondary to expressive aphasia and L facial droop. MRI negative for L lateral medullary infarct. PMH includes lymphoma, lung cancer, HTN, a fib, and CVA with residual L weakness.    Clinical Impression   PTA: pt living home with spouse and independent with ADL and mobility. Pt's spouse is very supportive. Pt currently, pt performing ADL functional mobility with RW and supervisionA to minguardA for safety. Pt currently performing ADL tasks in standing at sink with set-upA. Pt's vision in L eye is blurry "from before this hospital stay." Pt wears glases to correct vision, but plans to see eye doctor soon. Pt performing ADL overall with minA. Pt would benefit from continued OT skilled services in [facility] setting. OT signing off acutely.    Follow Up Recommendations  Home health OT    Equipment Recommendations  None recommended by OT    Recommendations for Other Services       Precautions / Restrictions Precautions Precautions: Fall Restrictions Weight Bearing Restrictions: No      Mobility Bed Mobility Overal bed mobility: Needs Assistance Bed Mobility: Supine to Sit     Supine to sit: Supervision     General bed mobility comments: supervision for sup to sit with use of bed rail  Transfers Overall transfer level: Needs assistance Equipment used: Rolling walker (2 wheeled) Transfers: Sit to/from Stand Sit to Stand: Min guard         General transfer comment: minguardA    Balance Overall balance assessment: Needs assistance Sitting-balance support: Feet supported Sitting balance-Leahy Scale: Fair     Standing balance support: Bilateral upper extremity supported Standing balance-Leahy Scale: Poor Standing balance comment: minG for dynamic standing balance                            ADL either performed or assessed with clinical judgement   ADL Overall ADL's : At baseline                                       General ADL Comments: Pt ambulating in room with RW and requires supervisionA for minguardA for mobility safety. Pt performing own grooming tasks.     Vision Baseline Vision/History: No visual deficits Vision Assessment?: No apparent visual deficits     Perception     Praxis      Pertinent Vitals/Pain Pain Assessment: No/denies pain     Hand Dominance     Extremity/Trunk Assessment Upper Extremity Assessment Upper Extremity Assessment: Generalized weakness;LUE deficits/detail LUE Deficits / Details: 3+/5 MM grade compared to 4-/5 MM grade on RUE LUE Coordination: decreased fine motor       Cervical / Trunk Assessment Cervical / Trunk Assessment: Normal   Communication Communication Communication: HOH   Cognition Arousal/Alertness: Awake/alert Behavior During Therapy: WFL for tasks assessed/performed Overall Cognitive Status: Within Functional Limits for tasks assessed                                     General Comments  Spouse in room. Pt struggling with HO as hearing aids charging    Exercises     Shoulder Instructions  Home Living Family/patient expects to be discharged to:: Private residence Living Arrangements: Spouse/significant other Available Help at Discharge: Family;Available 24 hours/day Type of Home: House Home Access: Stairs to enter [employer] of Steps: 6 Entrance Stairs-Rails: Right Home Layout: One level     Bathroom Shower/Tub: Walk-in shower;Tub/shower unit   Bathroom Toilet: Standard     Home Equipment: Environmental consultant - 2 wheels;Toilet riser          Prior Functioning/Environment Level of Independence: Independent with assistive device(s)        Comments: Used RW for ambulation         OT Problem List: Decreased strength;Decreased  coordination      OT Treatment/Interventions:      OT Goals(Current goals can be found in the care plan section) Acute Rehab OT Goals Patient Stated Goal: to go home  OT Frequency:     Barriers to D/C:            Co-evaluation              AM-PAC OT "6 Clicks" Daily Activity     Outcome Measure Help from another person eating meals?: None Help from another person taking care of personal grooming?: A Little Help from another person toileting, which includes using toliet, bedpan, or urinal?: A Little Help from another person bathing (including washing, rinsing, drying)?: A Little Help from another person to put on and taking off regular upper body clothing?: None Help from another person to put on and taking off regular lower body clothing?: A Little 6 Click Score: 20   End of Session Equipment Utilized During Treatment: Gait belt;Rolling walker Nurse Communication: Mobility status  Activity Tolerance: Patient tolerated treatment well Patient left: in chair;with call bell/phone within reach;with chair alarm set;with family/visitor present  OT Visit Diagnosis: Unsteadiness on feet (R26.81)                Time: 1130-1155 OT Time Calculation (min): 25 min Charges:  OT General Charges $OT Visit: 1 Visit OT Evaluation $OT Eval Moderate Complexity: 1 Mod OT Treatments $Self Care/Home Management : 8-22 mins   Ebony Hail Harold Hedge) Marsa Aris OTR/L Acute Rehabilitation Services Pager: [phone] Office: [place] 08/17/2019, 2:12 PM

## 2019-08-21 ENCOUNTER — Encounter: Payer: Self-pay | Admitting: *Deleted

## 2019-08-21 ENCOUNTER — Other Ambulatory Visit: Payer: Self-pay | Admitting: Internal Medicine

## 2019-08-21 ENCOUNTER — Other Ambulatory Visit: Payer: Self-pay | Admitting: *Deleted

## 2019-08-21 ENCOUNTER — Ambulatory Visit
Admission: RE | Admit: 2019-08-21 | Discharge: 2019-08-21 | Disposition: A | Discharge status: Home / Self Care | Payer: Medicare Other | Source: Ambulatory Visit | Attending: Internal Medicine | Admitting: Internal Medicine

## 2019-08-21 DIAGNOSIS — R0789 Other chest pain: Secondary | ICD-10-CM

## 2019-08-21 DIAGNOSIS — M25552 Pain in left hip: Secondary | ICD-10-CM

## 2019-08-21 NOTE — Patient Outreach (Signed)
[place] [facility]) Care Management  08/21/2019  SOLAN LAMMERS [birth year] [id number]   EMMI- stroke   RED ON EMMI ALERT Day # 1 Date: Sunday 08/19/19 1001 Red Alert Reason: Who reached Patient Scheduled a follow-up appointment? I Don't Know  Insurance: medicare and blue cross and blue shield [facility] admissions x 1 ED visits x 1 in the last 6 months  Last admission d/c home with [facility] for PT/OT SLP   Outreach attempt #1 successful at the home number Patient and wife are able to verify HIPAA, DOB and address Pt has expressive aphasia [facility] Care Management RN reviewed and addressed red alert with patient Mr Sophia Braford permission to speak with wife [name], and son, Chazz provided   EMMI:  Mrs Brookman confirms the answer was received wrong by [facility] automated system. Mr Kissling did make a follow up appointment with primary MD on for Friday 08/24/19  [facility] RN CM discussed the importance and purpose of follow up with primary care MD and specialist [facility] RN CM assessed for transportation needs to appointment Mrs Afable voiced interest in finding out in the future other medical transportation  resources but she reports her son will be taking Mr Yoos to the upcoming scheduled appointments this week    Fall 08/19/19 Mrs Parks reports Mr Largent fell on in their bedroom but did not receive an injury and will be evaluated by a staff member at Dr Delene Ruffini office today 08/21/19 at 1310   Social: Mr IREOLUWA SOISSON is a 85 year old retired male who lives at home with his wife, [name]. He is independent/assist in his care needs and his son provides transportation to medical appointments.    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, HTN, hx of Falls,  Cancer x 2( lung cancer & lymphoma last dx of cancer per wife), PAF, right hip pain, right knee pain, hx of closed comminuted supracondylar fx of right femur   Falls multiple unwitnessed per wife but last one about a  year ago outside and a recent fall on 08/18/19 in the bedroom   DME: walker,  may need a w/c later per wife She was encourage to ask primary care MD or his NP/PA on 08/21/19 or 08/24/19 for an order for w/c prn   Medications: They denies concerns with taking medications as prescribed, affording medications, side effects of medications and questions about medications   Appointments: Dr Laurann Montana staff - primary care provider 08/21/19 1310 after a fall  Dr Laurann Montana for hospital follow up on 08/24/19 Friday at [place] neurology f/u 09/19/19 1415  Advance Directives: Denies need for assist with advance directives  Reports they have the information forms and are working on it  [place] offered [facility] SW services but denied need at this time but may call back later    Consent: [facility] RN CM reviewed [facility] services with patient. Patient gave verbal consent for services [facility] telephonic RN CM.   Advised patient that there will be further automated EMMI- post discharge calls to assess how the patient is doing following the recent hospitalization Advised the patient that another call may be received from a nurse if any of their responses were abnormal. Patient voiced understanding and was appreciative of f/u call.   Plan: [facility] RN CM will follow up with Mr Oo within the next 10-14 business days to check on him after his fall and to assess for any other identified needs   Pt encouraged to  return a call to [place] CM prn Provided [facility] RN CM and 24 hour nurse line numbers  [facility] RN CM sent a successful outreach letter as discussed with [facility] brochure enclosed for review    L. Lavina Hamman, RN, BSN, [place] Coordinator Office number [phone] Mobile number [phone]  Main [facility]N number [phone] Fax number [phone]

## 2019-08-27 ENCOUNTER — Other Ambulatory Visit: Payer: Self-pay | Admitting: *Deleted

## 2019-08-27 NOTE — Patient Outreach (Signed)
[place] [facility]) Care Management  08/27/2019  Craig Chapman 1928/07/26 [id number]   EMMI- stroke   New RED ON EMMI ALERT Day # 6 Date: Friday 08/24/19 [place] Reason: Who reached Patient Feeling worse overall? Yes New or worsening pain/fever/shortness of breath? Yes Listened to these previous topics: Follow-up  Insurance: medicare and blue cross and blue shield [facility] admissions x 1 ED visits x 1 in the last 6 months  Last admission d/c home with [facility] for PT/OT SLP   Outreach attempt #2 successful at the home number Patient and wife are able to verify HIPAA, DOB and address Pt has expressive aphasia [facility] Care Management RN reviewed and addressed red alertwith patient Craig Chapman gave permission to speak with wife Craig Chapman  EMMI:  Craig Chapman confirms the answer was received correctly by [facility] automated system on 08/24/19. Craig Chapman did have some sob and felt tried but was seen by Dr Laurann Montana on 08/24/19 and issues are resolved.   Craig Chapman reports is doing well except Craig Chapman did not sleep well last night and today 08/27/19 is fairly weak. He is resting now. She feels he needs a good night worth of sleep tonight. [facility] RN CM discussed non medical interventions to assist with sleep (decreases surrounding noise and lighting, warm milk, melatonin. She reports she will try his melatonin prn) [facility] RN CM assessed for other s/s but other s/s denied. She was encouraged to call MD office if fatigue and weakness continued. She reports that his appetite and fluid intakes are good.   [facility] RN CM assessed for transportation needs to appointment as  Craig Chapman had voiced interest in finding possibly future resources for medical transportation during the last call contact. Today Craig Cota denies the need for transportation resources as she reports [place] and will provide transportation to all medical  appointments   Fall -Craig Chapman has not had any further falls since evaluated by Dr Laurann Montana on 08/24/19 per Craig Chapman  Social: Craig Chapman is a 85 year old retired male who lives at home with his wife, Craig Chapman. He is independent/assist in his care needs and his Children provide transportation to medical appointments.    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 She reports she was referred back to the home health PT for recommendation.  She was encouraged 08/27/19 to call [facility] RN CM or discuss DME with [facility] PT if it is determine in the future that Craig Chapman would benefit from a w/c She voiced understanding   Medications: They denies concerns with taking medications as prescribed, affording medications, side effects of medications and questions about medications   Appointments: Craig Chapman neurology f/u 09/19/19 1415  Advance Directives: Denies need for assist with advance directives  Reports they have the information forms and are working on it  [place] CM offered [facility] SW services but  denied need at this time but may call back later    Consent: [facility] RN CM reviewed [facility] services with patient. Patient gave verbal consent for services [facility] telephonic RN CM.   Advised patient that there will be further automated EMMI-post discharge calls to assess how the patient is doing following the recent hospitalization Advised the patient that another call may be received from a nurse if any of their  responses were abnormal. Patient voiced understanding and was appreciative of f/u call.   Plan: [facility] RN CM will close case at this time as patient has been assessed and no needs identified/needs resolved.   Pt encouraged to return a call to [facility] RN CM prn Provided [facility] RN CM and 24 hour nurse line numbers   Routed note to MD  Craig Millin L. Lavina Hamman, RN, BSN, [place] Coordinator Office number [phone] Mobile number [phone]  Main THN number [phone] Fax number [phone]

## 2019-08-28 ENCOUNTER — Other Ambulatory Visit: Payer: Self-pay | Admitting: *Deleted

## 2019-08-28 ENCOUNTER — Other Ambulatory Visit: Payer: Self-pay

## 2019-08-28 NOTE — Patient Outreach (Addendum)
[name]) Care Management  08/28/2019  Craig Chapman 1927-11-09 [id number]   EMMI- stroke   New RED ON EMMI ALERT Day #9 Date:Friday 08/24/19 1002 Red Alert Reason:Who reached Caregiver Feeling worse overall? Yes New or worsening pain/fever/shortness of breath? Yes New problems walking/talking/speaking/seeing? Yes  Insurance:medicare and blue cross and blue shield [facility] admissions x1ED visits x 1in the last 6 months Last admission d/c home with [facility] for PT/OT SLP   Outreach attempt #3 successful at the home number Patientand wife areable to verify HIPAA, DOB and address Pt has expressive aphasia [facility] Care Management RN reviewed and addressed red alertwith patient [facility] permission to speak [name]  EMMI: Craig Chapman again confirms the answer was received correctly by [facility] automated system on 08/27/19.   Craig Bickers reports is doing well and Craig Chapman slept better than he had on 08/1819 night, only got up twice to void last night 08/27/19 and today 08/28/19 is tired. He is up now but had been resting before the home health nurse visited today, 08/28/19 and examined him.  She was encouraged to call MD office this week if fatigue continued. She states she would   Fall -Craig Chapman has not had any further falls since evaluated by Dr Laurann Montana on 08/24/19 per Craig Taveras  Social: Craig Chapman is a 85 year old retired male who lives at home with his wife, [name]. He is independent/assist in his care needs and his Children provide transportation to medical appointments.   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 She reports she was referred back to the home health PT for recommendation.  She was encouraged 08/27/19 to call [facility] RN CM or discuss DME with [facility] PT if it is determine in the future that Craig Chapman would benefit from a w/c She voiced understanding   Medications:Theydenies concerns with taking medications as prescribed, affording medications, side effects of medications and questions about medications   Appointments: [name] neurology f/u 09/19/19 1415  Advance Directives:Denies need for assist with advance directivesReports they have the information forms and are working on it  [facility] RN CM offered [facility] SW services but denied need at this time but may call back later   Consent: [facility] RN CM reviewed [name] services with patient. Patient gave verbal consent for services [name] telephonic RN CM.   Advised patient that there will be further automated EMMI-post discharge calls to assess how the patient is doing following the recent hospitalization Advised the patient that another call may be received from a nurse if any of their responses were abnormal. Patient voiced understanding and was appreciative of f/u call.   Plan: [name] RN CM will close case at this time as patient has been assessed and no needs identified/needs resolved.   Pt encouraged to return a call to [place] CM prnProvided [facility] RN CM and 24 hour nurse line numbers   Routed note to MD  Joelene Millin L. Lavina Hamman,  RN, BSN, [place] Coordinator Office number [phone] Mobile number [phone]  Main THN number [phone] Fax number [phone]

## 2019-08-30 ENCOUNTER — Other Ambulatory Visit: Payer: Self-pay | Admitting: *Deleted

## 2019-08-30 NOTE — Patient Outreach (Signed)
[place] [facility]) Care Management  08/30/2019  JAYSION CAPIZZI 08-26-1928 [id number]   Opened in error  Joelene Millin L. Lavina Hamman, RN, BSN, [place] Coordinator Office number [phone] Mobile number [phone]  Main [facility]N number [phone] Fax number [phone]

## 2019-09-19 ENCOUNTER — Ambulatory Visit (INDEPENDENT_AMBULATORY_CARE_PROVIDER_SITE_OTHER): Payer: Medicare Other | Admitting: Adult Health

## 2019-09-19 ENCOUNTER — Encounter: Payer: Self-pay | Admitting: Adult Health

## 2019-09-19 ENCOUNTER — Other Ambulatory Visit: Payer: Self-pay

## 2019-09-19 VITALS — BP 118/63 | HR 72 | Temp 97.6°F | Ht 73.0 in | Wt 223.0 lb

## 2019-09-19 DIAGNOSIS — I639 Cerebral infarction, unspecified: Secondary | ICD-10-CM | POA: Diagnosis not present

## 2019-09-19 DIAGNOSIS — I10 Essential (primary) hypertension: Secondary | ICD-10-CM | POA: Diagnosis not present

## 2019-09-19 DIAGNOSIS — E785 Hyperlipidemia, unspecified: Secondary | ICD-10-CM | POA: Diagnosis not present

## 2019-09-19 MED ORDER — CLOPIDOGREL BISULFATE 75 MG PO TABS: 75.0000 mg | ORAL_TABLET | Freq: Every day | ORAL | 0 refills | Status: DC

## 2019-09-19 MED ORDER — ATORVASTATIN CALCIUM 40 MG PO TABS: 40.0000 mg | ORAL_TABLET | Freq: Every day | ORAL | 0 refills | Status: DC

## 2019-09-19 NOTE — Progress Notes (Signed)
[facility] [address] [place]. [place] [address] [phone]       HOSPITAL FOLLOW UP NOTE  Craig. Craig Chapman Date of Birth:  22-Aug-1928 Medical Record Number:  [id number]   Reason for Referral:  hospital stroke follow up    CHIEF COMPLAINT:  Chief Complaint  Patient presents with   [facility] stroke follow up treatment room pt with wife [name] and her temp is 97.6    HPI: Craig Chapman being seen today for in office hospital follow-up regarding left lateral medullary infarct.  History obtained from patient, wife and chart review. Reviewed all radiology images and labs personally.  Craig Chapman is a 85 y.o. male with history of AF not on AC, HTN, TIA, lung cancer s/p L pneumonectomy  who presented on 08/16/2019 with L facial droop, dryarthria, expressive aphasia, listing to the L with ambulation with BP spike of 200/100 with vomiting en route followed by bradycardia w/ presyncope.  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  In addition to acute stroke, MRI also showed numerous old small vessel infarcts.  MRA showed moderate right ICA and distal BA narrowing.  Carotid Dopplers largely unremarkable.  2D echo normal EF without cardiac source of embolus identified.  LDL 115.  A1c 5.4.  Recommended DAPT for 3 months and Plavix alone.  Blood pressure stabilized throughout admission and recommended long-term BP goal normotensive range.  Initiated atorvastatin 40 mg daily.  Other stroke risk factors include advanced age, obesity and prior history of stroke.  Residual deficits of dysarthria and dysphagia and was discharged home with wife with recommendation of home health therapies.  Craig Chapman is an 85 year old male who is being seen today for hospital follow-up accompanied by his wife.  Residual deficits of dysphagia with improvement of prior speech difficulties.  He has  since completed therapies.  Currently on a regular diet with occasional coughing with patient reporting to help clear his throat but does not feel any type of choking.  Continues to live with his wife who assist him minimally with ADLs.  Continues to ambulate with rolling walker where he unfortunately suffered a fall approximately 3 weeks ago resulting in lower back muscle strain.  He was evaluated by his PCP with imaging.  He has continued on aspirin and Plavix without bleeding or bruising.  Continues on atorvastatin without myalgias.  Blood pressure today 118/63.  Denies new or worsening stroke/TIA symptoms.      ROS:   14 system review of systems performed and negative with exception of swallowing difficulty  PMH:  Past Medical History:  Diagnosis Date   Arthritis    Atrial fibrillation ([place])    History of lung or bronchial cancer    Hypertension    Lymphoma ([place])    TIA (transient ischemic attack)     PSH:  Past Surgical History:  Procedure Laterality Date   BACK SURGERY     CHOLECYSTECTOMY     HERNIA REPAIR     ORIF FEMUR FRACTURE Right 05/15/2017   Procedure: OPEN REDUCTION INTERNAL FIXATION (ORIF) DISTAL FEMUR FRACTURE;  Surgeon: Newt Minion, MD;  Location: [facility];  Service: Orthopedics;  Laterality: Right;   PNEUMONECTOMY     REPLACEMENT TOTAL KNEE BILATERAL     ROTATOR CUFF REPAIR      Social History:  Social History   Socioeconomic History   Marital status: Married  Spouse name: [name]   Number of children: Not on file   Years of education: Not on file   Highest education level: Not on file  Occupational History   Occupation: retired   Scientist, product/process development strain: Not hard at [employer] insecurity    Worry: Never true    Inability: Never true   Transportation needs    Medical: No    Non-medical: No  Tobacco Use   Smoking status: Former Smoker   Smokeless tobacco: Never Used  Substance and Sexual Activity   Alcohol  use: No   Drug use: No   Sexual activity: Not on file  Lifestyle   Physical activity    Days per week: 0 days    Minutes per session: 0 min   Stress: Not at all  Relationships   Social connections    Talks on phone: Not on file    Gets together: Not on file    Attends religious service: Not on file    Active member of club or organization: Not on file    Attends meetings of clubs or organizations: Not on file    Relationship status: Not on file   Intimate partner violence    Fear of current or ex partner: Not on file    Emotionally abused: Not on file    Physically abused: Not on file    Forced sexual activity: Not on file  Other Topics Concern   Not on file  Social History Narrative   Craig Chapman is a 85 year old retired male who lives at home with his wife, [name]. He is independent/assist in his care needs and his son provides transportation to medical appointments.     Family History:  Family History  Family history unknown: Yes    Medications:   Current Outpatient Medications on File Prior to Visit  Medication Sig Dispense Refill   aspirin EC 325 MG EC tablet Take 1 tablet (325 mg total) by mouth daily. 30 tablet 0   atorvastatin (LIPITOR) 40 MG tablet Take 1 tablet (40 mg total) by mouth daily at 6 PM. 30 tablet 0   clopidogrel (PLAVIX) 75 MG tablet Take 1 tablet (75 mg total) by mouth daily. 30 tablet 0   Maltodextrin-Xanthan Gum (RESOURCE THICKENUP CLEAR) POWD Oral, As needed 125 g 0   No current facility-administered medications on file prior to visit.     Allergies:   Allergies  Allergen Reactions   Sulfa Antibiotics Hives     Physical Exam  Vitals:   09/19/19 1358  BP: 118/63  Pulse: 72  Temp: 97.6 F (36.4 C)  Weight: 223 lb (101.2 kg)  Height: 6\' 1"  (1.854 m)   Body mass index is 29.42 kg/m. No exam data present  Depression screen [facility] 2/9 09/19/2019  Decreased Interest 0  Down, Depressed, Hopeless 0  PHQ - 2 Score 0      General: well developed, well nourished,  very pleasant elderly Caucasian male, seated, in no evident distress Head: head normocephalic and atraumatic.   Neck: supple with no carotid or supraclavicular bruits Cardiovascular: regular rate and rhythm, no murmurs Musculoskeletal: no deformity Skin:  no rash/petichiae Vascular:  Normal pulses all extremities   Neurologic Exam Mental Status: Awake and fully alert.   Occasional mild dysarthria.  Oriented to place and time. Recent and remote memory intact. Attention span, concentration and fund of knowledge appropriate. Mood and affect appropriate.  Cranial Nerves: Fundoscopic  exam reveals sharp disc margins. Pupils equal, briskly reactive to light. Extraocular movements full without nystagmus. Visual fields full to confrontation.  HOH. Facial sensation intact. Face, tongue, palate moves normally and symmetrically.  Motor: Normal bulk and tone. Normal strength in all tested extremity muscles. Sensory.: intact to touch , pinprick , position and vibratory sensation.  Coordination: Rapid alternating movements normal in all extremities. Finger-to-nose and heel-to-shin performed accurately bilaterally. Gait and Station: Arises from chair with mild difficulty. Stance is slightly hunched. Gait demonstrates  short shuffled steps with mild imbalance and use of rolling walker Reflexes: 1+ and symmetric. Toes downgoing.     NIHSS  1 Modified Rankin  1      ASSESSMENT: Craig Chapman is a 85 y.o. year old male presented with left facial droop, dysarthria, expressive aphasia and listing towards the left on 08/16/2019 with stroke work-up revealing left lateral medullary infarct mostly secondary to small vessel disease. Vascular risk factors include HTN, prior TIA, HLD and lung cancer status post left pneumonectomy.  Reports mention history of AF not on [facility] but wife and patient deny history of A. fib and EKG during admission NSR.  He has been doing well  since discharge with residual mild dysphagia and occasional dysarthria    PLAN:  1. Left lateral medullary stroke: Continue aspirin 81 mg daily and clopidogrel 75 mg daily  and Lipitor for secondary stroke prevention.  Continue aspirin and Plavix for additional 2 months and then continue aspirin alone as he was previously not on blood thinners.  Maintain strict control of hypertension with blood pressure goal below 130/90, diabetes with hemoglobin A1c goal below 6.5% and cholesterol with LDL cholesterol (bad cholesterol) goal below 70 mg/dL.  I also advised the patient to eat a healthy diet with plenty of whole grains, cereals, fruits and vegetables, exercise regularly with at least 30 minutes of continuous activity daily and maintain ideal body weight. 2. HTN: Advised to continue current treatment regimen.  Today's BP stable.  Advised to continue to monitor at home along with continued follow-up with PCP for management 3. HLD: Advised to continue current treatment regimen along with continued follow-up with PCP for future prescribing and monitoring of lipid panel     Follow up in 4 months or call earlier if needed   Greater than 50% of time during this 45 minute visit was spent on counseling, explanation of diagnosis of left lateral medullary stroke, reviewing risk factor management of HTN and HLD, planning of further management along with potential future management, and discussion with patient and family answering all questions.    [name], AGNP-BC  [facility] [address] [place], [place] [address]  Phone [phone] Fax [phone] Note: This document was prepared with digital dictation and possible smart phrase technology. Any transcriptional errors that result from this process are unintentional.

## 2019-09-19 NOTE — Patient Instructions (Addendum)
Continue aspirin 81 mg daily and clopidogrel 75 mg daily  and Lipitor for secondary stroke prevention -refill for Lipitor will be sent to your pharmacy but request ongoing refills by PCP  Continue both aspirin and Plavix for an additional 2 months and then discontinue Plavix and continue aspirin alone -refill for Plavix will be sent to your pharmacy  Continue to follow up with PCP regarding cholesterol and blood pressure management   Continue to monitor blood pressure at home  Maintain strict control of hypertension with blood pressure goal below 130/90, diabetes with hemoglobin A1c goal below 6.5% and cholesterol with LDL cholesterol (bad cholesterol) goal below 70 mg/dL. I also advised the patient to eat a healthy diet with plenty of whole grains, cereals, fruits and vegetables, exercise regularly and maintain ideal body weight.  Followup in the future with me in 4 months or call earlier if needed       Thank you for coming to see [place] at [facility]. I hope we have been able to provide you high quality care today.  You may receive a patient satisfaction survey over the next few weeks. We would appreciate your feedback and comments so that we may continue to improve ourselves and the health of our patients.   Stroke Prevention Some medical conditions and lifestyle choices can lead to a higher risk for a stroke. You can help to prevent a stroke by making nutrition, lifestyle, and other changes. What nutrition changes can be made?   Eat healthy foods. ? Choose foods that are high in fiber. These include:  Fresh fruits.  Fresh vegetables.  Whole grains. ? Eat at least 5 or more servings of fruits and vegetables each day. Try to fill half of your plate at each meal with fruits and vegetables. ? Choose lean protein foods. These include:  Lowfat (lean) cuts of meat.  Chicken without skin.  Fish.  Tofu.  Beans.  Nuts. ? Eat low-fat dairy products. ? Avoid  foods that:  Are high in salt (sodium).  Have saturated fat.  Have trans fat.  Have cholesterol.  Are processed.  Are premade.  Follow eating guidelines as told by your doctor. These may include: ? Reducing how many calories you eat and drink each day. ? Limiting how much salt you eat or drink each day to 1,500 milligrams (mg). ? Using only healthy fats for cooking. These include:  Olive oil.  Canola oil.  Sunflower oil. ? Counting how many carbohydrates you eat and drink each day. What lifestyle changes can be made?  Try to stay at a healthy weight. Talk to your doctor about what a good weight is for you.  Get at least 30 minutes of moderate physical activity at least 5 days a week. This can include: ? Fast walking. ? Biking. ? Swimming.  Do not use any products that have nicotine or tobacco. This includes cigarettes and e-cigarettes. If you need help quitting, ask your doctor. Avoid being around tobacco smoke in general.  Limit how much alcohol you drink to no more than 1 drink a day for nonpregnant women and 2 drinks a day for men. One drink equals 12 oz of beer, 5 oz of wine, or 1 oz of hard liquor.  Do not use drugs.  Avoid taking birth control pills. Talk to your doctor about the risks of taking birth control pills if: ? You are over 39 years old. ? You smoke. ? You get migraines. ? You have had  a blood clot. What other changes can be made?  Manage your cholesterol. ? It is important to eat a healthy diet. ? If your cholesterol cannot be managed through your diet, you may also need to take medicines. Take medicines as told by your doctor.  Manage your diabetes. ? It is important to eat a healthy diet and to exercise regularly. ? If your blood sugar cannot be managed through diet and exercise, you may need to take medicines. Take medicines as told by your doctor.  Control your high blood pressure (hypertension). ? Try to keep your blood pressure below  130/80. This can help lower your risk of stroke. ? It is important to eat a healthy diet and to exercise regularly. ? If your blood pressure cannot be managed through diet and exercise, you may need to take medicines. Take medicines as told by your doctor. ? Ask your doctor if you should check your blood pressure at home. ? Have your blood pressure checked every year. Do this even if your blood pressure is normal.  Talk to your doctor about getting checked for a sleep disorder. Signs of this can include: ? Snoring a lot. ? Feeling very tired.  Take over-the-counter and prescription medicines only as told by your doctor. These may include aspirin or blood thinners (antiplatelets or anticoagulants).  Make sure that any other medical conditions you have are managed. Where to find more information  American Stroke Association: www.strokeassociation.org  National Stroke Association: www.stroke.org Get help right away if:  You have any symptoms of stroke. "BE FAST" is an easy way to remember the main warning signs: ? B - Balance. Signs are dizziness, sudden trouble walking, or loss of balance. ? E - Eyes. Signs are trouble seeing or a sudden change in how you see. ? F - Face. Signs are sudden weakness or loss of feeling of the face, or the face or eyelid drooping on one side. ? A - Arms. Signs are weakness or loss of feeling in an arm. This happens suddenly and usually on one side of the body. ? S - Speech. Signs are sudden trouble speaking, slurred speech, or trouble understanding what people say. ? T - Time. Time to call emergency services. Write down what time symptoms started.  You have other signs of stroke, such as: ? A sudden, very bad headache with no known cause. ? Feeling sick to your stomach (nausea). ? Throwing up (vomiting). ? Jerky movements you cannot control (seizure). These symptoms may represent a serious problem that is an emergency. Do not wait to see if the symptoms  will go away. Get medical help right away. Call your local emergency services (911 in the [place] Do not drive yourself to the hospital. Summary  You can prevent a stroke by eating healthy, exercising, not smoking, drinking less alcohol, and treating other health problems, such as diabetes, high blood pressure, or high cholesterol.  Do not use any products that contain nicotine or tobacco, such as cigarettes and e-cigarettes.  Get help right away if you have any signs or symptoms of a stroke. This information is not intended to replace advice given to you by your health care provider. Make sure you discuss any questions you have with your health care provider. Document Released: 04/25/2012 Document Revised: 12/21/2018 Document Reviewed: 01/26/2017 Elsevier Patient Education  2020 [employer].

## 2019-09-19 NOTE — Addendum Note (Signed)
Addended by: Mal Misty on: 09/19/2019 04:45 PM   Modules accepted: Orders

## 2019-09-23 NOTE — Progress Notes (Signed)
I agree with the above plan 

## 2019-12-19 ENCOUNTER — Telehealth: Payer: Self-pay

## 2019-12-19 NOTE — Telephone Encounter (Signed)
Received a fax from [facility] stating that the patient has not filled his clopidogrel (PLAVIX) 75 MG tablet since 09/19/2019.

## 2019-12-19 NOTE — Telephone Encounter (Signed)
Noted! Thank you

## 2020-01-17 ENCOUNTER — Other Ambulatory Visit: Payer: Self-pay

## 2020-01-17 ENCOUNTER — Encounter: Payer: Self-pay | Admitting: Adult Health

## 2020-01-17 ENCOUNTER — Ambulatory Visit (INDEPENDENT_AMBULATORY_CARE_PROVIDER_SITE_OTHER): Payer: Medicare Other | Admitting: Adult Health

## 2020-01-17 VITALS — BP 166/80 | HR 72 | Temp 97.5°F | Ht 73.0 in | Wt 228.8 lb

## 2020-01-17 DIAGNOSIS — E785 Hyperlipidemia, unspecified: Secondary | ICD-10-CM | POA: Diagnosis not present

## 2020-01-17 DIAGNOSIS — I10 Essential (primary) hypertension: Secondary | ICD-10-CM

## 2020-01-17 DIAGNOSIS — I639 Cerebral infarction, unspecified: Secondary | ICD-10-CM

## 2020-01-17 MED ORDER — ATORVASTATIN CALCIUM 40 MG PO TABS: 40.0000 mg | ORAL_TABLET | Freq: Every day | ORAL | 3 refills | Status: AC

## 2020-01-17 MED ORDER — ASPIRIN EC 81 MG PO TBEC: 325.0000 mg | DELAYED_RELEASE_TABLET | Freq: Every day | ORAL | 3 refills | Status: AC

## 2020-01-17 NOTE — Progress Notes (Signed)
[facility] [address] [place]. [place] [address] [phone]       STROKE FOLLOW UP NOTE  Craig. Craig Chapman Date of Birth:  02/21/1928 Medical Record Number:  [id number]   Reason for Referral: stroke follow up    CHIEF COMPLAINT:  Chief Complaint  Patient presents with  . Follow-up    Rm 9, son.  Per son, wife pt is not on any medicatons. Why??  . Cerebrovascular Accident    HPI:   Craig Chapman is a 86 year old male who is being seen today, 01/17/2020, for stroke follow-up accompanied by his son.  He has been doing well since prior visit with residual deficits of mild dysarthria and occasional swallowing difficulties but he also endorses difficulty clearing throat at times due to increased mucus which has been present for numerous years.  Apparently he is not currently taking any medications as he ran out of prior prescriptions and did not reach out to PCP for refill.  At prior visit, it was recommended to continue atorvastatin 40 mg daily as well as 2 additional months of DAPT and then aspirin alone.  Per patient report, he was not previously on aspirin as reported during hospitalization.  Son questions need of checking lipid panel as this has not been repeated since hospitalization.  Blood pressure today 166/80 but monitors at home and typically within normal range.  No further concerns at this time.     History copied for reference purposes only Stroke admission 08/16/2019: Craig Chapman is a 86 y.o. male with history of AF not on AC, HTN, TIA, lung cancer s/p L pneumonectomy  who presented on 08/16/2019 with L facial droop, dryarthria, expressive aphasia, listing to the L with ambulation with BP spike of 200/100 with vomiting en route followed by bradycardia w/ presyncope.  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  In addition to acute stroke, MRI also showed numerous old  small vessel infarcts.  MRA showed moderate right ICA and distal BA narrowing.  Carotid Dopplers largely unremarkable.  2D echo normal EF without cardiac source of embolus identified.  LDL 115.  A1c 5.4.  Recommended DAPT for 3 months and Plavix alone.  Blood pressure stabilized throughout admission and recommended long-term BP goal normotensive range.  Initiated atorvastatin 40 mg daily.  Other stroke risk factors include advanced age, obesity and prior history of stroke.  Residual deficits of dysarthria and dysphagia and was discharged home with wife with recommendation of home health therapies.  Initial visit 09/19/2019: Craig Chapman is an 86 year old male who is being seen today for hospital follow-up accompanied by his wife.  Residual deficits of dysphagia with improvement of prior speech difficulties.  He has since completed therapies.  Currently on a regular diet with occasional coughing with patient reporting to help clear his throat but does not feel any type of choking.  Continues to live with his wife who assist him minimally with ADLs.  Continues to ambulate with rolling walker where he unfortunately suffered a fall approximately 3 weeks ago resulting in lower back muscle strain.  He was evaluated by his PCP with imaging.  He has continued on aspirin and Plavix without bleeding or bruising.  Continues on atorvastatin without myalgias.  Blood pressure today 118/63.  Denies new or worsening stroke/TIA symptoms.     ROS:   14 system review of systems performed and negative with exception of swallowing difficulty  PMH:  Past Medical History:  Diagnosis Date  . Arthritis   . Atrial fibrillation ([place])   . History of lung or bronchial cancer   . Hypertension   . Lymphoma ([place])   . TIA (transient ischemic attack)     PSH:  Past Surgical History:  Procedure Laterality Date  . BACK SURGERY    . CHOLECYSTECTOMY    . HERNIA REPAIR    . ORIF FEMUR FRACTURE Right 05/15/2017   Procedure: OPEN  REDUCTION INTERNAL FIXATION (ORIF) DISTAL FEMUR FRACTURE;  Surgeon: Newt Minion, MD;  Location: [facility];  Service: Orthopedics;  Laterality: Right;  . PNEUMONECTOMY    . REPLACEMENT TOTAL KNEE BILATERAL    . ROTATOR CUFF REPAIR      Social History:  Social History   Socioeconomic History  . Marital status: Married    Spouse name: [name]  . Number of children: Not on file  . Years of education: Not on file  . Highest education level: Not on file  Occupational History  . Occupation: retired   Tobacco Use  . Smoking status: Former Research scientist (life sciences)  . Smokeless tobacco: Never Used  Substance and Sexual Activity  . Alcohol use: No  . Drug use: No  . Sexual activity: Not on file  Other Topics Concern  . Not on file  Social History Narrative   Craig Chapman is a 86 year old retired male who lives at home with his wife, [name]. He is independent/assist in his care needs and his son provides transportation to medical appointments.    Social Determinants of Health   Financial Resource Strain: Low Risk   . Difficulty of Paying Living Expenses: Not hard at all  Food Insecurity: No Food Insecurity  . Worried About Charity fundraiser in the Last Year: Never true  . Ran Out of Food in the Last Year: Never true  Transportation Needs: No Transportation Needs  . Lack of Transportation (Medical): No  . Lack of Transportation (Non-Medical): No  Physical Activity: Inactive  . Days of Exercise per Week: 0 days  . Minutes of Exercise per Session: 0 min  Stress: No Stress Concern Present  . Feeling of Stress : Not at all  Social Connections:   . Frequency of Communication with Friends and Family:   . Frequency of Social Gatherings with Friends and Family:   . Attends Religious Services:   . Active Member of Clubs or Organizations:   . Attends Archivist Meetings:   Marland Kitchen Marital Status:   Intimate Partner Violence:   . Fear of Current or Ex-Partner:   . Emotionally Abused:   Marland Kitchen  Physically Abused:   . Sexually Abused:     Family History:  Family History  Family history unknown: Yes    Medications:   No current outpatient medications on file prior to visit.   No current facility-administered medications on file prior to visit.    Allergies:   Allergies  Allergen Reactions  . Sulfa Antibiotics Hives     Physical Exam  Vitals:   01/17/20 1346  BP: (!) 166/80  Pulse: 72  Temp: (!) 97.5 F (36.4 C)  SpO2: 93%  Weight: 228 lb 12.8 oz (103.8 kg)  Height: 6\' 1"  (1.854 m)   Body mass index is 30.19 kg/m. No exam data present   General: well developed, well nourished, very pleasant elderly Caucasian male, seated, in no evident distress Head: head normocephalic and atraumatic.   Neck: supple  with no carotid or supraclavicular bruits Cardiovascular: regular rate and rhythm, no murmurs Musculoskeletal: no deformity Skin:  no rash/petichiae Vascular:  Normal pulses all extremities   Neurologic Exam Mental Status: Awake and fully alert.   Slight dysarthria. Oriented to place and time. Recent and remote memory intact. Attention span, concentration and fund of knowledge appropriate. Mood and affect appropriate.  Cranial Nerves: Pupils equal, briskly reactive to light. Extraocular movements full without nystagmus. Visual fields full to confrontation.  HOH bilaterally. Facial sensation intact. Face, tongue, palate moves normally and symmetrically.  Motor: Normal bulk and tone. Normal strength in all tested extremity muscles. Sensory.: intact to touch , pinprick , position and vibratory sensation.  Coordination: Rapid alternating movements normal in all extremities. Finger-to-nose and heel-to-shin performed accurately bilaterally. Gait and Station: Arises from chair with mild difficulty. Stance is slightly hunched. Gait demonstrates  short shuffled steps with mild imbalance and use of rolling walker Reflexes: 1+ and symmetric. Toes downgoing.          ASSESSMENT: Craig Chapman is a 86 y.o. year old male presented with left facial droop, dysarthria, expressive aphasia and listing towards the left on 08/16/2019 with stroke work-up revealing left lateral medullary infarct mostly secondary to small vessel disease. Vascular risk factors include HTN, prior TIA, HLD and lung cancer status post left pneumonectomy.  Reports mention history of AF not on [facility] but wife and patient deny history of A. fib and EKG during admission NSR.  Residual deficits of occasional dysphagia and mild dysarthria    PLAN:  1. Left lateral medullary stroke: Restart aspirin 81 mg daily  and Lipitor 40 mg daily for secondary stroke prevention. Refills placed but ongoing refills requested to be prescribed by PCP.  Maintain strict control of hypertension with blood pressure goal below 130/90, diabetes with hemoglobin A1c goal below 6.5% and cholesterol with LDL cholesterol (bad cholesterol) goal below 70 mg/dL.  I also advised the patient to eat a healthy diet with plenty of whole grains, cereals, fruits and vegetables, exercise regularly with at least 30 minutes of continuous activity daily and maintain ideal body weight. 2. HTN: Advised to continue current treatment regimen.  Advised to continue to monitor at home along with continued follow-up with PCP for management 3. HLD: Advised to restart atorvastatin 40 mg daily and will recheck lipid panel at today's visit to ensure satisfactory management.  Ongoing monitoring and management as well as refills requested to be provided by PCP 4. Discussion regarding possible allergies with postnasal drip causing sensation of mucus in his throat interfering with adequate swallowing.  Recommend patient follow-up with PCP for possible need of allergy medication which would be appropriate for patient.  Also recommended use of humidifier while sleeping, increase fluids and avoid irritants   Follow-up in 6 months or call earlier if  needed   I spent 26 minutes of face-to-face and non-face-to-face time with patient.  This included previsit chart review, lab review, study review, order entry, electronic health record documentation, patient education    [name], [facility]  [facility] [address] [place], [place] [address]  Phone [phone] Fax [phone] Note: This document was prepared with digital dictation and possible smart phrase technology. Any transcriptional errors that result from this process are unintentional.

## 2020-01-17 NOTE — Patient Instructions (Addendum)
Restart aspirin 81 mg daily  and lipitor  for secondary stroke prevention - refill placed but ongoing refills will be through PCP  Continue to follow up with PCP regarding cholesterol and blood pressure management   Will check your cholesterol levels to ensure satisfactory management  Speak with your PCP regarding possible need of allergy medication as your mucous concerns could be due to allergies and post nasal drip   Continue to monitor blood pressure at home  Maintain strict control of hypertension with blood pressure goal below 130/90, diabetes with hemoglobin A1c goal below 6.5% and cholesterol with LDL cholesterol (bad cholesterol) goal below 70 mg/dL. I also advised the patient to eat a healthy diet with plenty of whole grains, cereals, fruits and vegetables, exercise regularly and maintain ideal body weight.  Followup in the future with me in 6 months or call earlier if needed       Thank you for coming to see [place] at [facility]. I hope we have been able to provide you high quality care today.  You may receive a patient satisfaction survey over the next few weeks. We would appreciate your feedback and comments so that we may continue to improve ourselves and the health of our patients.

## 2020-01-18 LAB — LIPID PANEL
Chol/HDL Ratio: 4 ratio (ref 0.0–5.0)
Cholesterol, Total: 173 mg/dL (ref 100–199)
HDL: 43 mg/dL (ref >39)
LDL Chol Calc (NIH): 101 mg/dL — ABNORMAL HIGH (ref 0–99)
Triglycerides: 168 mg/dL — ABNORMAL HIGH (ref 0–149)
VLDL Cholesterol Cal: 29 mg/dL (ref 5–40)

## 2020-02-11 NOTE — Progress Notes (Signed)
I agree with the above plan 

## 2020-05-26 ENCOUNTER — Emergency Department (HOSPITAL_COMMUNITY): Payer: Medicare Other

## 2020-05-26 ENCOUNTER — Other Ambulatory Visit: Payer: Self-pay

## 2020-05-26 ENCOUNTER — Inpatient Hospital Stay (HOSPITAL_COMMUNITY)
Admission: EM | Admit: 2020-05-26 | Discharge: 2020-06-08 | DRG: 177 | Disposition: E | Payer: Medicare Other | Attending: Internal Medicine | Admitting: Internal Medicine

## 2020-05-26 ENCOUNTER — Encounter (HOSPITAL_COMMUNITY): Payer: Self-pay | Admitting: Internal Medicine

## 2020-05-26 DIAGNOSIS — Z882 Allergy status to sulfonamides status: Secondary | ICD-10-CM | POA: Diagnosis not present

## 2020-05-26 DIAGNOSIS — Z96653 Presence of artificial knee joint, bilateral: Secondary | ICD-10-CM | POA: Diagnosis present

## 2020-05-26 DIAGNOSIS — J9601 Acute respiratory failure with hypoxia: Secondary | ICD-10-CM | POA: Diagnosis present

## 2020-05-26 DIAGNOSIS — I480 Paroxysmal atrial fibrillation: Secondary | ICD-10-CM | POA: Diagnosis present

## 2020-05-26 DIAGNOSIS — U071 COVID-19: Secondary | ICD-10-CM | POA: Diagnosis present

## 2020-05-26 DIAGNOSIS — D649 Anemia, unspecified: Secondary | ICD-10-CM | POA: Diagnosis not present

## 2020-05-26 DIAGNOSIS — Z8673 Personal history of transient ischemic attack (TIA), and cerebral infarction without residual deficits: Secondary | ICD-10-CM

## 2020-05-26 DIAGNOSIS — Z902 Acquired absence of lung [part of]: Secondary | ICD-10-CM | POA: Diagnosis not present

## 2020-05-26 DIAGNOSIS — I10 Essential (primary) hypertension: Secondary | ICD-10-CM | POA: Diagnosis present

## 2020-05-26 DIAGNOSIS — R54 Age-related physical debility: Secondary | ICD-10-CM | POA: Diagnosis present

## 2020-05-26 DIAGNOSIS — Z87891 Personal history of nicotine dependence: Secondary | ICD-10-CM

## 2020-05-26 DIAGNOSIS — E870 Hyperosmolality and hypernatremia: Secondary | ICD-10-CM | POA: Diagnosis not present

## 2020-05-26 DIAGNOSIS — J1282 Pneumonia due to coronavirus disease 2019: Secondary | ICD-10-CM

## 2020-05-26 DIAGNOSIS — Z85118 Personal history of other malignant neoplasm of bronchus and lung: Secondary | ICD-10-CM

## 2020-05-26 DIAGNOSIS — D509 Iron deficiency anemia, unspecified: Secondary | ICD-10-CM | POA: Diagnosis present

## 2020-05-26 DIAGNOSIS — R0602 Shortness of breath: Secondary | ICD-10-CM

## 2020-05-26 DIAGNOSIS — Z87440 Personal history of urinary (tract) infections: Secondary | ICD-10-CM | POA: Diagnosis not present

## 2020-05-26 DIAGNOSIS — Z8572 Personal history of non-Hodgkin lymphomas: Secondary | ICD-10-CM

## 2020-05-26 DIAGNOSIS — Z515 Encounter for palliative care: Secondary | ICD-10-CM | POA: Diagnosis not present

## 2020-05-26 DIAGNOSIS — J189 Pneumonia, unspecified organism: Secondary | ICD-10-CM

## 2020-05-26 DIAGNOSIS — E876 Hypokalemia: Secondary | ICD-10-CM | POA: Diagnosis present

## 2020-05-26 DIAGNOSIS — E860 Dehydration: Secondary | ICD-10-CM | POA: Diagnosis present

## 2020-05-26 DIAGNOSIS — E785 Hyperlipidemia, unspecified: Secondary | ICD-10-CM | POA: Diagnosis present

## 2020-05-26 DIAGNOSIS — G92 Toxic encephalopathy: Secondary | ICD-10-CM | POA: Diagnosis present

## 2020-05-26 DIAGNOSIS — Z7982 Long term (current) use of aspirin: Secondary | ICD-10-CM

## 2020-05-26 DIAGNOSIS — Z66 Do not resuscitate: Secondary | ICD-10-CM | POA: Diagnosis not present

## 2020-05-26 DIAGNOSIS — Z79899 Other long term (current) drug therapy: Secondary | ICD-10-CM

## 2020-05-26 DIAGNOSIS — K922 Gastrointestinal hemorrhage, unspecified: Secondary | ICD-10-CM

## 2020-05-26 DIAGNOSIS — R7989 Other specified abnormal findings of blood chemistry: Secondary | ICD-10-CM | POA: Diagnosis not present

## 2020-05-26 DIAGNOSIS — J690 Pneumonitis due to inhalation of food and vomit: Secondary | ICD-10-CM | POA: Diagnosis not present

## 2020-05-26 LAB — COMPREHENSIVE METABOLIC PANEL
ALT: 25 U/L (ref 0–44)
AST: 55 U/L — ABNORMAL HIGH (ref 15–41)
Albumin: 2.4 g/dL — ABNORMAL LOW (ref 3.5–5.0)
Alkaline Phosphatase: 63 U/L (ref 38–126)
Anion gap: 9 (ref 5–15)
BUN: 18 mg/dL (ref 8–23)
CO2: 22 mmol/L (ref 22–32)
Calcium: 7.5 mg/dL — ABNORMAL LOW (ref 8.9–10.3)
Chloride: 105 mmol/L (ref 98–111)
Creatinine, Ser: 0.93 mg/dL (ref 0.61–1.24)
GFR calc Af Amer: >60 mL/min (ref >60)
GFR calc non Af Amer: >60 mL/min (ref >60)
Glucose, Bld: 116 mg/dL — ABNORMAL HIGH (ref 70–99)
Potassium: 3.3 mmol/L — ABNORMAL LOW (ref 3.5–5.1)
Sodium: 136 mmol/L (ref 135–145)
Total Bilirubin: 0.8 mg/dL (ref 0.3–1.2)
Total Protein: 6.4 g/dL — ABNORMAL LOW (ref 6.5–8.1)

## 2020-05-26 LAB — URINALYSIS, COMPLETE (UACMP) WITH MICROSCOPIC
Bacteria, UA: NONE SEEN
Bilirubin Urine: NEGATIVE
Glucose, UA: NEGATIVE mg/dL
Hgb urine dipstick: NEGATIVE
Ketones, ur: NEGATIVE mg/dL
Leukocytes,Ua: NEGATIVE
Nitrite: NEGATIVE
Protein, ur: NEGATIVE mg/dL
Specific Gravity, Urine: 1.016 (ref 1.005–1.030)
pH: 5 (ref 5.0–8.0)

## 2020-05-26 LAB — I-STAT ARTERIAL BLOOD GAS, ED
Acid-Base Excess: 0 mmol/L (ref 0.0–2.0)
Bicarbonate: 24.2 mmol/L (ref 20.0–28.0)
Calcium, Ion: 1.12 mmol/L — ABNORMAL LOW (ref 1.15–1.40)
HCT: 35 % — ABNORMAL LOW (ref 39.0–52.0)
Hemoglobin: 11.9 g/dL — ABNORMAL LOW (ref 13.0–17.0)
O2 Saturation: 94 %
Patient temperature: 98.6
Potassium: 3.5 mmol/L (ref 3.5–5.1)
Sodium: 139 mmol/L (ref 135–145)
TCO2: 25 mmol/L (ref 22–32)
pCO2 arterial: 39.3 mmHg (ref 32.0–48.0)
pH, Arterial: 7.398 (ref 7.350–7.450)
pO2, Arterial: 70 mmHg — ABNORMAL LOW (ref 83.0–108.0)

## 2020-05-26 LAB — SARS CORONAVIRUS 2 BY RT PCR (HOSPITAL ORDER, PERFORMED IN ~~LOC~~ HOSPITAL LAB): SARS Coronavirus 2: POSITIVE — AB

## 2020-05-26 LAB — PROTIME-INR
INR: 1.5 — ABNORMAL HIGH (ref 0.8–1.2)
Prothrombin Time: 17.2 seconds — ABNORMAL HIGH (ref 11.4–15.2)

## 2020-05-26 LAB — ABO/RH: ABO/RH(D): A NEG

## 2020-05-26 LAB — IRON AND TIBC
Iron: 14 ug/dL — ABNORMAL LOW (ref 45–182)
Saturation Ratios: 6 % — ABNORMAL LOW (ref 17.9–39.5)
TIBC: 242 ug/dL — ABNORMAL LOW (ref 250–450)
UIBC: 228 ug/dL

## 2020-05-26 LAB — CBC WITH DIFFERENTIAL/PLATELET
Abs Immature Granulocytes: 0.03 10*3/uL (ref 0.00–0.07)
Basophils Absolute: 0 10*3/uL (ref 0.0–0.1)
Basophils Relative: 0 %
Eosinophils Absolute: 0 10*3/uL (ref 0.0–0.5)
Eosinophils Relative: 0 %
HCT: 27.5 % — ABNORMAL LOW (ref 39.0–52.0)
Hemoglobin: 8.6 g/dL — ABNORMAL LOW (ref 13.0–17.0)
Immature Granulocytes: 1 %
Lymphocytes Relative: 20 %
Lymphs Abs: 0.6 10*3/uL — ABNORMAL LOW (ref 0.7–4.0)
MCH: 29 pg (ref 26.0–34.0)
MCHC: 31.3 g/dL (ref 30.0–36.0)
MCV: 92.6 fL (ref 80.0–100.0)
Monocytes Absolute: 0.3 10*3/uL (ref 0.1–1.0)
Monocytes Relative: 9 %
Neutro Abs: 2.1 10*3/uL (ref 1.7–7.7)
Neutrophils Relative %: 70 %
Platelets: 148 10*3/uL — ABNORMAL LOW (ref 150–400)
RBC: 2.97 MIL/uL — ABNORMAL LOW (ref 4.22–5.81)
RDW: 15.3 % (ref 11.5–15.5)
WBC: 3 10*3/uL — ABNORMAL LOW (ref 4.0–10.5)
nRBC: 0 % (ref 0.0–0.2)

## 2020-05-26 LAB — LACTIC ACID, PLASMA: Lactic Acid, Venous: 1.1 mmol/L (ref 0.5–1.9)

## 2020-05-26 LAB — BRAIN NATRIURETIC PEPTIDE: B Natriuretic Peptide: 361.5 pg/mL — ABNORMAL HIGH (ref 0.0–100.0)

## 2020-05-26 LAB — TYPE AND SCREEN
ABO/RH(D): A NEG
Antibody Screen: NEGATIVE

## 2020-05-26 LAB — POC OCCULT BLOOD, ED: Fecal Occult Bld: POSITIVE — AB

## 2020-05-26 LAB — FERRITIN: Ferritin: 213 ng/mL (ref 24–336)

## 2020-05-26 LAB — APTT: aPTT: 37 seconds — ABNORMAL HIGH (ref 24–36)

## 2020-05-26 LAB — CBG MONITORING, ED: Glucose-Capillary: 113 mg/dL — ABNORMAL HIGH (ref 70–99)

## 2020-05-26 MED ORDER — ALBUTEROL SULFATE HFA 108 (90 BASE) MCG/ACT IN AERS
4.0000 | INHALATION_SPRAY | Freq: Once | RESPIRATORY_TRACT | Status: AC
  Administered 2020-05-26: 4 via RESPIRATORY_TRACT
  Filled 2020-05-26: qty 6.7

## 2020-05-26 MED ORDER — DEXAMETHASONE SODIUM PHOSPHATE 10 MG/ML IJ SOLN
6.0000 mg | Freq: Once | INTRAMUSCULAR | Status: AC
  Administered 2020-05-26: 6 mg via INTRAVENOUS
  Filled 2020-05-26: qty 1

## 2020-05-26 MED ORDER — GUAIFENESIN-DM 100-10 MG/5ML PO SYRP: 10.0000 mL | ORAL_SOLUTION | ORAL | Status: DC | PRN

## 2020-05-26 MED ORDER — ATORVASTATIN CALCIUM 40 MG PO TABS
40.0000 mg | ORAL_TABLET | Freq: Every day | ORAL | Status: DC
  Administered 2020-05-26 – 2020-06-01 (×6): 40 mg via ORAL
  Filled 2020-05-26 (×7): qty 1

## 2020-05-26 MED ORDER — DEXAMETHASONE 6 MG PO TABS: 6.0000 mg | ORAL_TABLET | ORAL | Status: DC

## 2020-05-26 MED ORDER — ORAL CARE MOUTH RINSE
15.0000 mL | Freq: Two times a day (BID) | OROMUCOSAL | Status: DC
  Administered 2020-05-27 – 2020-06-03 (×15): 15 mL via OROMUCOSAL

## 2020-05-26 MED ORDER — PANTOPRAZOLE SODIUM 40 MG PO TBEC
40.0000 mg | DELAYED_RELEASE_TABLET | Freq: Every day | ORAL | Status: DC
  Administered 2020-05-26 – 2020-06-01 (×6): 40 mg via ORAL
  Filled 2020-05-26 (×6): qty 1

## 2020-05-26 MED ORDER — SODIUM CHLORIDE 0.9 % IV SOLN
100.0000 mg | Freq: Every day | INTRAVENOUS | Status: AC
  Administered 2020-05-27 – 2020-05-30 (×4): 100 mg via INTRAVENOUS
  Filled 2020-05-26 (×4): qty 20

## 2020-05-26 MED ORDER — ENOXAPARIN SODIUM 40 MG/0.4ML ~~LOC~~ SOLN
40.0000 mg | SUBCUTANEOUS | Status: DC
  Administered 2020-05-26: 40 mg via SUBCUTANEOUS
  Filled 2020-05-26: qty 0.4

## 2020-05-26 MED ORDER — ACETAMINOPHEN 500 MG PO TABS: 500.0000 mg | ORAL_TABLET | Freq: Four times a day (QID) | ORAL | Status: DC | PRN

## 2020-05-26 MED ORDER — SODIUM CHLORIDE 0.9 % IV SOLN
200.0000 mg | Freq: Once | INTRAVENOUS | Status: AC
  Administered 2020-05-26: 200 mg via INTRAVENOUS
  Filled 2020-05-26: qty 40

## 2020-05-26 MED ORDER — SENNOSIDES-DOCUSATE SODIUM 8.6-50 MG PO TABS: 1.0000 | ORAL_TABLET | Freq: Every evening | ORAL | Status: DC | PRN

## 2020-05-26 MED ORDER — ASPIRIN EC 325 MG PO TBEC
325.0000 mg | DELAYED_RELEASE_TABLET | Freq: Every day | ORAL | Status: DC
  Administered 2020-05-26 – 2020-05-27 (×2): 325 mg via ORAL
  Filled 2020-05-26 (×2): qty 1

## 2020-05-26 MED ORDER — IPRATROPIUM-ALBUTEROL 20-100 MCG/ACT IN AERS
1.0000 | INHALATION_SPRAY | Freq: Four times a day (QID) | RESPIRATORY_TRACT | Status: DC
  Administered 2020-05-26 – 2020-06-02 (×21): 1 via RESPIRATORY_TRACT
  Filled 2020-05-26: qty 4

## 2020-05-26 MED ORDER — POTASSIUM CHLORIDE CRYS ER 20 MEQ PO TBCR
40.0000 meq | EXTENDED_RELEASE_TABLET | Freq: Once | ORAL | Status: AC
  Administered 2020-05-26: 40 meq via ORAL
  Filled 2020-05-26: qty 2

## 2020-05-26 NOTE — Plan of Care (Signed)
Case discussed and reviewed with Dr. Tomi Bamberger (ED).  Patient admitted with shortness of breath from COVID-positive pneumonia.  Has interval drop in Hgb without overt GI bleeding.  Primary focus now is of course on COVID pneumonia.  Regarding anemia, I would suggest PPI as you are doing, serial CBCs, transfusion PRBCs as needed, and otherwise medical management of anemia.  Would not consider endoscopy or any GI tract evaluation in absence of overt destabilizing hemorrhage (which patient at this time does not have).  Please call us back if any further questions/concerns arise during this hospitalization.

## 2020-05-26 NOTE — ED Triage Notes (Signed)
Arrived via [place], per EMS patient family stated that patient had AMS, patient had UTI and was on Cipro x3 days and haven't gotten any better.  EMS stated patient was 80% on RA and they puyt him on NRB and went up to 96%.  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  CBG-108, HR-76, 102/55

## 2020-05-26 NOTE — H&P (Addendum)
History and Physical    Craig Chapman [id number] DOB: 04-Nov-1928 DOA: 05/17/2020  PCP: Lavone Orn, MD (Confirm with patient/family/NH records and if not entered, this has to be entered at [facility] point of entry) Patient coming from: Home  I have personally briefly reviewed patient's old medical records in [place]  Chief Complaint: AMS  HPI: Craig Chapman is a 86 y.o. male with medical history significant of HLD, paroxysmal A. Fib, remote history of lymphoma, remote history of lung cancer status post left pneumonectomy, presented with confusion and short of breath.  Patient started to have episodes of confusion and subjective fever, family contacted PCP who suspect patient had UTI and thus started pt on Cipro.  Patient completed 3 days of Cipro, but remained confused.  Patient also developed increasing short of breath and wheezing since yesterday.  Patient son reported that last week, patient's several relatives have mild symptoms of cough and patient had 1 day of loose bowel movement resolved next day.  He denied any abdominal pain, no black or tarry stool no abdominal pain. ED Course: EMS found patient O2 saturation in the 80s and placed on NRB. ABG in ED 7.39/39/70.  Pressure was right lung diffuse infiltrates.  Covid test came back positive.  Hemoglobin 8.6 compared to 12 about 9 months ago.  Review of Systems: As per HPI otherwise 10 point review of systems negative.    Past Medical History:  Diagnosis Date  . Arthritis   . Atrial fibrillation ([place])   . History of lung or bronchial cancer   . Hypertension   . Lymphoma ([place])   . TIA (transient ischemic attack)     Past Surgical History:  Procedure Laterality Date  . BACK SURGERY    . CHOLECYSTECTOMY    . HERNIA REPAIR    . ORIF FEMUR FRACTURE Right 05/15/2017   Procedure: OPEN REDUCTION INTERNAL FIXATION (ORIF) DISTAL FEMUR FRACTURE;  Surgeon: Newt Minion, MD;  Location: [facility];  Service: Orthopedics;   Laterality: Right;  . PNEUMONECTOMY    . REPLACEMENT TOTAL KNEE BILATERAL    . ROTATOR CUFF REPAIR       reports that he has quit smoking. He has never used smokeless tobacco. He reports that he does not drink alcohol and does not use drugs.  Allergies  Allergen Reactions  . Sulfa Antibiotics Hives    Family History  Family history unknown: Yes     Prior to Admission medications   Medication Sig Start Date End Date Taking? Authorizing Provider  acetaminophen (TYLENOL) 500 MG tablet Take 500 mg by mouth every 6 (six) hours as needed for fever.   Yes [provider]  aspirin 81 MG tablet Take 4 tablets (325 mg total) by mouth daily. 01/17/20  Yes McCue, Janett Billow, NP  atorvastatin (LIPITOR) 40 MG tablet Take 1 tablet (40 mg total) by mouth daily at 6 PM. 01/17/20  Yes McCue, Janett Billow, NP  ciprofloxacin (CIPRO) 250 MG tablet Take 250 mg by mouth 2 (two) times daily. 05/24/20  Yes [provider]    Physical Exam: Vitals:   05/23/2020 1200 06/01/2020 1208 06/03/2020 1209 05/25/2020 1307  BP: 117/61   (!) 157/73  Pulse: 65   69  Resp: (!) 21   (!) 24  SpO2: 95%  96% 91%  Weight:  104.3 kg    Height:  6' (1.829 m)      Constitutional: NAD, calm, comfortable Vitals:   06/02/2020 1200 05/29/2020 1208 05/22/2020 1209 05/10/2020  1307  BP: 117/61   (!) 157/73  Pulse: 65   69  Resp: (!) 21   (!) 24  SpO2: 95%  96% 91%  Weight:  104.3 kg    Height:  6' (1.829 m)     Eyes: PERRL, lids and conjunctivae normal ENMT: Mucous membranes are moist. Posterior pharynx clear of any exudate or lesions.Normal dentition.  Neck: normal, supple, no masses, no thyromegaly Respiratory: No breathing sound appreciated on the left, diffused wheezing on the right side.  Increasing respiratory effort, talking in broken sentences. No accessory muscle use.  Cardiovascular: Regular rate and rhythm, no murmurs / rubs / gallops. No extremity edema. 2+ pedal pulses. No carotid bruits.  Abdomen: no tenderness,  no masses palpated. No hepatosplenomegaly. Bowel sounds positive.  Musculoskeletal: no clubbing / cyanosis. No joint deformity upper and lower extremities. Good ROM, no contractures. Normal muscle tone.  Skin: no rashes, lesions, ulcers. No induration Neurologic: CN 2-12 grossly intact. Sensation intact, DTR normal. Strength 5/5 in all 4.  Psychiatric: Oriented to person and place, confused about time   Labs on Admission: I have personally reviewed following labs and imaging studies  CBC: Recent Labs  Lab 05/13/2020 1200 05/21/2020 1332  WBC 3.0*  --   NEUTROABS 2.1  --   HGB 8.6* 11.9*  HCT 27.5* 35.0*  MCV 92.6  --   PLT 148*  --    Basic Metabolic Panel: Recent Labs  Lab 05/18/2020 1200 05/14/2020 1332  NA 136 139  K 3.3* 3.5  CL 105  --   CO2 22  --   GLUCOSE 116*  --   BUN 18  --   CREATININE 0.93  --   CALCIUM 7.5*  --    GFR: Estimated Creatinine Clearance: 65.9 mL/min (by C-G formula based on SCr of 0.93 mg/dL). Liver Function Tests: Recent Labs  Lab 06/02/2020 1200  AST 55*  ALT 25  ALKPHOS 63  BILITOT 0.8  PROT 6.4*  ALBUMIN 2.4*   No results for input(s): LIPASE, AMYLASE in the last 168 hours. No results for input(s): AMMONIA in the last 168 hours. Coagulation Profile: Recent Labs  Lab 05/14/2020 1200  INR 1.5*   Cardiac Enzymes: No results for input(s): CKTOTAL, CKMB, CKMBINDEX, TROPONINI in the last 168 hours. BNP (last 3 results) No results for input(s): PROBNP in the last 8760 hours. HbA1C: No results for input(s): HGBA1C in the last 72 hours. CBG: Recent Labs  Lab 05/27/2020 1312  GLUCAP 113*   Lipid Profile: No results for input(s): CHOL, HDL, LDLCALC, TRIG, CHOLHDL, LDLDIRECT in the last 72 hours. Thyroid Function Tests: No results for input(s): TSH, T4TOTAL, FREET4, T3FREE, THYROIDAB in the last 72 hours. Anemia Panel: No results for input(s): VITAMINB12, FOLATE, FERRITIN, TIBC, IRON, RETICCTPCT in the last 72 hours. Urine analysis:      Component Value Date/Time   COLORURINE YELLOW 08/16/2019 0028   APPEARANCEUR HAZY (A) 08/16/2019 0028   LABSPEC 1.021 08/16/2019 0028   PHURINE 5.0 08/16/2019 0028   GLUCOSEU NEGATIVE 08/16/2019 0028   HGBUR NEGATIVE 08/16/2019 0028   BILIRUBINUR NEGATIVE 08/16/2019 0028   KETONESUR NEGATIVE 08/16/2019 0028   PROTEINUR NEGATIVE 08/16/2019 0028   UROBILINOGEN 0.2 02/15/2015 1513   NITRITE NEGATIVE 08/16/2019 0028   LEUKOCYTESUR NEGATIVE 08/16/2019 0028    Radiological Exams on Admission: DG Chest 1 View  Result Date: 05/24/2020 CLINICAL DATA:  Altered mental status, recent UTI, history of lung cancer, lymphoma, hypertension, atrial fibrillation EXAM: CHEST  1 VIEW  COMPARISON:  08/21/2019 FINDINGS: Post LEFT pneumonectomy with persistent opacification of LEFT hemithorax. Atherosclerotic calcification aorta. Perihilar infiltrates identified in RIGHT upper lobe consistent with pneumonia. Enlargement of RIGHT pulmonary hilum, could reflect prominent vascular structure, adenopathy, mass, or artifact from slight rotation to the LEFT. No pneumothorax. Bones demineralized with chronic RIGHT rotator cuff tear. IMPRESSION: Postsurgical changes LEFT pneumonectomy. New RIGHT upper lobe infiltrate consistent with pneumonia. Question RIGHT hilar mass/adenopathy versus artifact from rotation. Electronically Signed   By: Lavonia Dana M.D.   On: 06/03/2020 12:49   CT Head Wo Contrast  Result Date: 06/01/2020 CLINICAL DATA:  Altered mental status EXAM: CT HEAD WITHOUT CONTRAST TECHNIQUE: Contiguous axial images were obtained from the base of the skull through the vertex without intravenous contrast. COMPARISON:  08/16/2019 FINDINGS: Brain: Atrophic changes and scattered lacunar infarcts and chronic white matter ischemic change are seen and relatively stable from the prior exam. No findings to suggest acute hemorrhage, acute infarction or space-occupying mass lesion are noted. Vascular: No hyperdense vessel or  unexpected calcification. Skull: Normal. Negative for fracture or focal lesion. Sinuses/Orbits: No acute finding. Other: None. IMPRESSION: Atrophic and ischemic changes without acute abnormality. Electronically Signed   By: Inez Catalina M.D.   On: 05/23/2020 12:52    EKG: Independently reviewed.  Sinus rhythm, borderline prolonged QTC  Assessment/Plan Active Problems:   COVID-19 virus infection   COVID-19  (please populate well all problems here in Problem List. (For example, if patient is on BP meds at home and you resume or decide to hold them, it is a problem that needs to be her. Same for CAD, COPD, HLD and so on)  Acute hypoxic respite failure secondary to COVID-19 pneumonia -Start remdesivir and Decadron -Bronchodilators, Tussin -Check daily blood work and x-ray  Question of right hilar mass -History of left-sided lung cancer status post lobectomy, was considered cured several years ago. -Consider repeat CAT scan once pneumonia treated  Anemia, question of GI bleed -No history of GI bleed, start PPI -GI service notified will follow up -Send iron studies  Hypokalemia -Replaced and recheck in AM  Recent UTI -Completed 3 days of Cirpo, will check PVR.  HLD -Statin  DVT prophylaxis: Lovenox Code Status: Partial code, patient desired chest compression but no intubation "I only have one side lung left" Family Communication: Son over the phone Disposition Plan: Given advantage and complicated lung issue in the past, expected more than 3 days hospital stay.  PT evaluation Consults called: None Admission status: Telemetry admission  Lequita Halt MD [facility] Pager [phone]  05/23/2020, 2:27 PM

## 2020-05-26 NOTE — ED Notes (Signed)
Help get patient undress on the monitor patient is resting did ekg shown to Dr Tomi Bamberger patient is resting with call bell in reach

## 2020-05-26 NOTE — ED Notes (Signed)
Off floor for CT

## 2020-05-26 NOTE — ED Notes (Signed)
Family at bedside. 

## 2020-05-26 NOTE — ED Notes (Signed)
Attempted to call Report unsuccessful with nurse or Charge nurse.

## 2020-05-26 NOTE — ED Provider Notes (Addendum)
[facility] EMERGENCY DEPARTMENT Provider Note   CSN: [id number] Arrival date & time: 05/13/2020  1106     History Chief Complaint  Patient presents with  . Altered Mental Status    Craig Chapman is a 86 y.o. male.  HPI   Patient presented to ED for evaluation of confusion.  According to the EMS report family stated that the patient was diagnosed with a urinary tract infection a few days ago.  Apparently that was when he started to become confused.  He has been taking Cipro for 3 days and they do not feel that he is getting any better.  Reports are that he has had fevers at home but no documented temperature.  When EMS arrived they noted the patient was hypoxic.  Oxygen saturation was in the 80s.  That improved with supplemental oxygen.  Patient himself is alert and awake but is not sure why he is here.  He denies have any specific complaints.  He is not having pain.  Family members were able to provide some additional history.  Patient was confused prior to starting his Cipro.  Patient also has not been vaccinated against Covid  Past Medical History:  Diagnosis Date  . Arthritis   . Atrial fibrillation ([place])   . History of lung or bronchial cancer   . Hypertension   . Lymphoma ([place])   . TIA (transient ischemic attack)     Patient Active Problem List   Diagnosis Date Noted  . Acute ischemic stroke ([place]) 08/16/2019  . Acute pain of right knee 11/07/2017  . Right hip pain 06/22/2017  . Closed comminuted supracondylar fracture of right femur ([place]) 05/14/2017  . PAF (paroxysmal atrial fibrillation) ([place]) 05/14/2017  . Hypertension 05/14/2017  . History of lung or bronchial cancer 05/14/2017  . Closed comminuted supracondylar fracture of femur, right, initial encounter ([place])   . Fall     Past Surgical History:  Procedure Laterality Date  . BACK SURGERY    . CHOLECYSTECTOMY    . HERNIA REPAIR    . ORIF FEMUR FRACTURE Right 05/15/2017   Procedure: OPEN  REDUCTION INTERNAL FIXATION (ORIF) DISTAL FEMUR FRACTURE;  Surgeon: Newt Minion, MD;  Location: [facility];  Service: Orthopedics;  Laterality: Right;  . PNEUMONECTOMY    . REPLACEMENT TOTAL KNEE BILATERAL    . ROTATOR CUFF REPAIR         Family History  Family history unknown: Yes    Social History   Tobacco Use  . Smoking status: Former Research scientist (life sciences)  . Smokeless tobacco: Never Used  Substance Use Topics  . Alcohol use: No  . Drug use: No    Home Medications Prior to Admission medications   Medication Sig Start Date End Date Taking? Authorizing Provider  acetaminophen (TYLENOL) 500 MG tablet Take 500 mg by mouth every 6 (six) hours as needed for fever.   Yes [provider]  aspirin 81 MG tablet Take 4 tablets (325 mg total) by mouth daily. 01/17/20  Yes McCue, Janett Billow, NP  atorvastatin (LIPITOR) 40 MG tablet Take 1 tablet (40 mg total) by mouth daily at 6 PM. 01/17/20  Yes McCue, Janett Billow, NP  ciprofloxacin (CIPRO) 250 MG tablet Take 250 mg by mouth 2 (two) times daily. 05/24/20  Yes [provider]    Allergies    Sulfa antibiotics  Review of Systems   Review of Systems  All other systems reviewed and are negative.   Physical Exam Updated Vital Signs  BP (!) 157/73   Pulse 69   Resp (!) 24   Ht 1.829 m (6')   Wt 104.3 kg   SpO2 91%   BMI 31.19 kg/m   Physical Exam Vitals and nursing note reviewed.  Constitutional:      Appearance: He is well-developed. He is not toxic-appearing or diaphoretic.  HENT:     Head: Normocephalic and atraumatic.     Right Ear: External ear normal.     Left Ear: External ear normal.  Eyes:     General: No scleral icterus.       Right eye: No discharge.        Left eye: No discharge.     Conjunctiva/sclera: Conjunctivae normal.  Neck:     Trachea: No tracheal deviation.  Cardiovascular:     Rate and Rhythm: Normal rate and regular rhythm.  Pulmonary:     Effort: Pulmonary effort is normal. No respiratory distress.       Breath sounds: No stridor. Wheezing present. No rales.  Abdominal:     General: Bowel sounds are normal. There is no distension.     Palpations: Abdomen is soft.     Tenderness: There is no abdominal tenderness. There is no guarding or rebound.  Musculoskeletal:        General: No tenderness.     Cervical back: Neck supple.  Skin:    General: Skin is warm and dry.     Findings: No rash.  Neurological:     Mental Status: He is alert. He is disoriented.     Cranial Nerves: No cranial nerve deficit (no facial droop, extraocular movements intact, no slurred speech).     Sensory: No sensory deficit.     Motor: No abnormal muscle tone or seizure activity.     Coordination: Coordination normal.     ED Results / Procedures / Treatments   Labs (all labs ordered are listed, but only abnormal results are displayed) Labs Reviewed  SARS CORONAVIRUS 2 BY RT PCR ([place]) - Abnormal; Notable for the following components:      Result Value   SARS Coronavirus 2 POSITIVE (*)    All other components within normal limits  COMPREHENSIVE METABOLIC PANEL - Abnormal; Notable for the following components:   Potassium 3.3 (*)    Glucose, Bld 116 (*)    Calcium 7.5 (*)    Total Protein 6.4 (*)    Albumin 2.4 (*)    AST 55 (*)    All other components within normal limits  CBC WITH DIFFERENTIAL/PLATELET - Abnormal; Notable for the following components:   WBC 3.0 (*)    RBC 2.97 (*)    Hemoglobin 8.6 (*)    HCT 27.5 (*)    Platelets 148 (*)    Lymphs Abs 0.6 (*)    All other components within normal limits  APTT - Abnormal; Notable for the following components:   aPTT 37 (*)    All other components within normal limits  PROTIME-INR - Abnormal; Notable for the following components:   Prothrombin Time 17.2 (*)    INR 1.5 (*)    All other components within normal limits  BRAIN NATRIURETIC PEPTIDE - Abnormal; Notable for the following components:    B Natriuretic Peptide 361.5 (*)    All other components within normal limits  CBG MONITORING, ED - Abnormal; Notable for the following components:   Glucose-Capillary 113 (*)    All other components  within normal limits  I-STAT ARTERIAL BLOOD GAS, ED - Abnormal; Notable for the following components:   pO2, Arterial 70 (*)    Calcium, Ion 1.12 (*)    HCT 35.0 (*)    Hemoglobin 11.9 (*)    All other components within normal limits  POC OCCULT BLOOD, ED - Abnormal; Notable for the following components:   Fecal Occult Bld POSITIVE (*)    All other components within normal limits  CULTURE, BLOOD (ROUTINE X 2)  CULTURE, BLOOD (ROUTINE X 2)  LACTIC ACID, PLASMA  URINALYSIS, COMPLETE (UACMP) WITH MICROSCOPIC    EKG EKG Interpretation  Date/Time:  Monday May 26 2020 11:19:33 EDT Ventricular Rate:  73 PR Interval:    QRS Duration: 106 QT Interval:  466 QTC Calculation: 514 R Axis:   19 Text Interpretation: sinus rhythm Ventricular premature complex Prolonged QT interval , new since last tracing Confirmed by Dorie Rank [phone]) on 05/14/2020 11:24:11 AM   Radiology DG Chest 1 View  Result Date: 05/13/2020 CLINICAL DATA:  Altered mental status, recent UTI, history of lung cancer, lymphoma, hypertension, atrial fibrillation EXAM: CHEST  1 VIEW COMPARISON:  08/21/2019 FINDINGS: Post LEFT pneumonectomy with persistent opacification of LEFT hemithorax. Atherosclerotic calcification aorta. Perihilar infiltrates identified in RIGHT upper lobe consistent with pneumonia. Enlargement of RIGHT pulmonary hilum, could reflect prominent vascular structure, adenopathy, mass, or artifact from slight rotation to the LEFT. No pneumothorax. Bones demineralized with chronic RIGHT rotator cuff tear. IMPRESSION: Postsurgical changes LEFT pneumonectomy. New RIGHT upper lobe infiltrate consistent with pneumonia. Question RIGHT hilar mass/adenopathy versus artifact from rotation. Electronically Signed   By: Lavonia Dana M.D.   On: 05/10/2020 12:49   CT Head Wo Contrast  Result Date: 06/05/2020 CLINICAL DATA:  Altered mental status EXAM: CT HEAD WITHOUT CONTRAST TECHNIQUE: Contiguous axial images were obtained from the base of the skull through the vertex without intravenous contrast. COMPARISON:  08/16/2019 FINDINGS: Brain: Atrophic changes and scattered lacunar infarcts and chronic white matter ischemic change are seen and relatively stable from the prior exam. No findings to suggest acute hemorrhage, acute infarction or space-occupying mass lesion are noted. Vascular: No hyperdense vessel or unexpected calcification. Skull: Normal. Negative for fracture or focal lesion. Sinuses/Orbits: No acute finding. Other: None. IMPRESSION: Atrophic and ischemic changes without acute abnormality. Electronically Signed   By: Inez Catalina M.D.   On: 05/09/2020 12:52    Procedures .Critical Care Performed by: Dorie Rank, MD Authorized by: Dorie Rank, MD   Critical care provider statement:    Critical care time (minutes):  45   Critical care was time spent personally by me on the following activities:  Discussions with consultants, evaluation of patient's response to treatment, examination of patient, ordering and performing treatments and interventions, ordering and review of laboratory studies, ordering and review of radiographic studies, pulse oximetry, re-evaluation of patient's condition, obtaining history from patient or surrogate and review of old charts   (including critical care time)  Medications Ordered in ED Medications  dexamethasone (DECADRON) injection 6 mg (has no administration in time range)  albuterol (VENTOLIN HFA) 108 (90 Base) MCG/ACT inhaler 4 puff (4 puffs Inhalation Given 05/21/2020 1321)    ED Course  I have reviewed the triage vital signs and the nursing notes.  Pertinent labs & imaging results that were available during my care of the patient were reviewed by me and considered in my  medical decision making (see chart for details).  Clinical Course as of May 26 1426  Mon May 26, 2020  1309 No electrolyte abnormalities to account for the mental status changes   Comprehensive metabolic panel(!) [JK]  5366 Hgb decreased from previous   [JK]  1338 Chest x-ray suggest pneumonia.  ABG without respiratory acidosis.   [JK]  4403 Dr Erlinda Hong office, [facility] contacted.  They will page him   [JK]    Clinical Course User Index [JK] Dorie Rank, MD   MDM Rules/Calculators/A&P                          Patient presents to the ED for evaluation of weakness, confusion.  ED work-up notable for new anemia.  Hemoccult was performed the patient is Hemoccult positive.  He appears to have a component of GI bleeding.  No indication for blood transfusion at this time.  Chest x-ray she also showed evidence of pneumonia.  Patient's Covid test is positive.  He has not been vaccinated.  I will start remdesivir and Decadron.  Results were discussed with the patient and his family members.  I will consult the medical service for admission and further treatment  Final Clinical Impression(s) / ED Diagnoses Final diagnoses:  Pneumonia due to COVID-19 virus  Anemia, unspecified type  Gastrointestinal hemorrhage, unspecified gastrointestinal hemorrhage type      Dorie Rank, MD 05/15/2020 1348    Dorie Rank, MD 05/29/2020 1427

## 2020-05-26 NOTE — Plan of Care (Signed)

## 2020-05-27 ENCOUNTER — Inpatient Hospital Stay (HOSPITAL_COMMUNITY): Payer: Medicare Other

## 2020-05-27 DIAGNOSIS — R7989 Other specified abnormal findings of blood chemistry: Secondary | ICD-10-CM

## 2020-05-27 LAB — COMPREHENSIVE METABOLIC PANEL
ALT: 25 U/L (ref 0–44)
AST: 55 U/L — ABNORMAL HIGH (ref 15–41)
Albumin: 2.4 g/dL — ABNORMAL LOW (ref 3.5–5.0)
Alkaline Phosphatase: 67 U/L (ref 38–126)
Anion gap: 6 (ref 5–15)
BUN: 15 mg/dL (ref 8–23)
CO2: 27 mmol/L (ref 22–32)
Calcium: 8.1 mg/dL — ABNORMAL LOW (ref 8.9–10.3)
Chloride: 105 mmol/L (ref 98–111)
Creatinine, Ser: 0.79 mg/dL (ref 0.61–1.24)
GFR calc Af Amer: >60 mL/min (ref >60)
GFR calc non Af Amer: >60 mL/min (ref >60)
Glucose, Bld: 143 mg/dL — ABNORMAL HIGH (ref 70–99)
Potassium: 4.1 mmol/L (ref 3.5–5.1)
Sodium: 138 mmol/L (ref 135–145)
Total Bilirubin: 0.6 mg/dL (ref 0.3–1.2)
Total Protein: 6.9 g/dL (ref 6.5–8.1)

## 2020-05-27 LAB — CBC WITH DIFFERENTIAL/PLATELET
Abs Immature Granulocytes: 0.02 10*3/uL (ref 0.00–0.07)
Basophils Absolute: 0 10*3/uL (ref 0.0–0.1)
Basophils Relative: 0 %
Eosinophils Absolute: 0 10*3/uL (ref 0.0–0.5)
Eosinophils Relative: 0 %
HCT: 30.7 % — ABNORMAL LOW (ref 39.0–52.0)
Hemoglobin: 9.7 g/dL — ABNORMAL LOW (ref 13.0–17.0)
Immature Granulocytes: 1 %
Lymphocytes Relative: 35 %
Lymphs Abs: 0.6 10*3/uL — ABNORMAL LOW (ref 0.7–4.0)
MCH: 29.3 pg (ref 26.0–34.0)
MCHC: 31.6 g/dL (ref 30.0–36.0)
MCV: 92.7 fL (ref 80.0–100.0)
Monocytes Absolute: 0.1 10*3/uL (ref 0.1–1.0)
Monocytes Relative: 8 %
Neutro Abs: 1 10*3/uL — ABNORMAL LOW (ref 1.7–7.7)
Neutrophils Relative %: 56 %
Platelets: 173 10*3/uL (ref 150–400)
RBC: 3.31 MIL/uL — ABNORMAL LOW (ref 4.22–5.81)
RDW: 15.1 % (ref 11.5–15.5)
WBC: 1.8 10*3/uL — ABNORMAL LOW (ref 4.0–10.5)
nRBC: 0 % (ref 0.0–0.2)

## 2020-05-27 LAB — PROCALCITONIN: Procalcitonin: <0.1 ng/mL

## 2020-05-27 LAB — FERRITIN: Ferritin: 197 ng/mL (ref 24–336)

## 2020-05-27 LAB — D-DIMER, QUANTITATIVE: D-Dimer, Quant: 3.02 ug/mL-FEU — ABNORMAL HIGH (ref 0.00–0.50)

## 2020-05-27 LAB — MAGNESIUM: Magnesium: 2.1 mg/dL (ref 1.7–2.4)

## 2020-05-27 LAB — BRAIN NATRIURETIC PEPTIDE: B Natriuretic Peptide: 530.8 pg/mL — ABNORMAL HIGH (ref 0.0–100.0)

## 2020-05-27 LAB — PHOSPHORUS: Phosphorus: 2.5 mg/dL (ref 2.5–4.6)

## 2020-05-27 LAB — C-REACTIVE PROTEIN: CRP: 6.9 mg/dL — ABNORMAL HIGH (ref <1.0)

## 2020-05-27 MED ORDER — DEXAMETHASONE SODIUM PHOSPHATE 10 MG/ML IJ SOLN
6.0000 mg | INTRAMUSCULAR | Status: DC
  Administered 2020-05-27 – 2020-05-28 (×2): 6 mg via INTRAVENOUS
  Filled 2020-05-27 (×2): qty 1

## 2020-05-27 MED ORDER — HYDRALAZINE HCL 20 MG/ML IJ SOLN
10.0000 mg | Freq: Four times a day (QID) | INTRAMUSCULAR | Status: DC | PRN
  Administered 2020-05-28: 10 mg via INTRAVENOUS
  Filled 2020-05-27: qty 1

## 2020-05-27 MED ORDER — FERROUS SULFATE 325 (65 FE) MG PO TABS
325.0000 mg | ORAL_TABLET | Freq: Two times a day (BID) | ORAL | Status: DC
  Administered 2020-05-27 – 2020-06-01 (×10): 325 mg via ORAL
  Filled 2020-05-27 (×10): qty 1

## 2020-05-27 MED ORDER — ENOXAPARIN SODIUM 60 MG/0.6ML ~~LOC~~ SOLN
60.0000 mg | SUBCUTANEOUS | Status: DC
  Administered 2020-05-27 – 2020-05-30 (×4): 60 mg via SUBCUTANEOUS
  Filled 2020-05-27 (×4): qty 0.6

## 2020-05-27 MED ORDER — DOCUSATE SODIUM 100 MG PO CAPS
200.0000 mg | ORAL_CAPSULE | Freq: Two times a day (BID) | ORAL | Status: DC
  Administered 2020-05-27 – 2020-06-01 (×8): 200 mg via ORAL
  Filled 2020-05-27 (×8): qty 2

## 2020-05-27 MED ORDER — ASPIRIN EC 81 MG PO TBEC
81.0000 mg | DELAYED_RELEASE_TABLET | Freq: Every day | ORAL | Status: DC
  Administered 2020-05-28 – 2020-06-01 (×4): 81 mg via ORAL
  Filled 2020-05-27 (×4): qty 1

## 2020-05-27 NOTE — Plan of Care (Signed)
  Problem: Clinical Measurements: Goal: Respiratory complications will improve Outcome: Progressing   

## 2020-05-27 NOTE — Evaluation (Signed)
Physical Therapy Evaluation Patient Details Name: Craig Chapman MRN: [id number] DOB: 1927-12-30 Today's Date: 05/27/2020   History of Present Illness  86 y.o. male with medical history significant of HLD, paroxysmal A. Fib, remote history of lymphoma, remote history of lung cancer status post left pneumonectomy, presented with confusion and short of breath.  Patient started to have episodes of confusion and subjective fever, family contacted PCP who suspect patient had UTI and thus started pt on Cipro.  Patient completed 3 days of Cipro, but remained confused. +Acute hypoxic respiratory failure secondary to COVID-19 pneumonia; anemia  Clinical Impression   Pt admitted with above diagnosis. Patient with slow processing and slow movements, requiring incr time to complete tasks. He started on 4L  O2 with sats 89-90% at rest (supine) and ultimately had to be increased to 6L with sats 85% (walking). By end of session, he was back on 4L with sats 88-90%. Spoke with his daughter during session (at pt's request) and she states the family is very committed to bringing pt home.  Pt currently with functional limitations due to the deficits listed below (see PT Problem List). Pt will benefit from skilled PT to increase their independence and safety with mobility to allow discharge to the venue listed below.       Follow Up Recommendations Home health PT;Supervision/Assistance - 24 hour    Equipment Recommendations  None recommended by PT    Recommendations for Other Services OT consult     Precautions / Restrictions Precautions Precautions: Fall      Mobility  Bed Mobility Overal bed mobility: Needs Assistance Bed Mobility: Supine to Sit     Supine to sit: Mod assist;HOB elevated     General bed mobility comments: pt able to get legs over EOB; assist to raise torso and then to scoot out to EOB with feet on the floor  Transfers Overall transfer level: Needs assistance Equipment used:  Rolling walker (2 wheeled) Transfers: Sit to/from Stand Sit to Stand: Min assist         General transfer comment: vc for proper set-up; pt prefers hands on RW as he stands from the bed and RW anchored for him; from chair used armrests  Ambulation/Gait Ambulation/Gait assistance: Min assist Gait Distance (Feet): 5 Feet Assistive device: Rolling walker (2 wheeled) Gait Pattern/deviations: Step-to pattern;Decreased stride length;Shuffle;Trunk flexed     General Gait Details: pt limited by dypnea and decr sats to 85% on 6L  Stairs            Wheelchair Mobility    Modified Rankin (Stroke Patients Only)       Balance Overall balance assessment: Needs assistance Sitting-balance support: No upper extremity supported;Feet supported Sitting balance-Leahy Scale: Fair Sitting balance - Comments: sitting EOB without UE support as gown changed due to wetness   Standing balance support: Bilateral upper extremity supported Standing balance-Leahy Scale: Poor Standing balance comment: requires bil UE support via RW and min assist                             Pertinent Vitals/Pain Pain Assessment: No/denies pain    Home Living Family/patient expects to be discharged to:: Private residence Living Arrangements: Spouse/significant other Available Help at Discharge: Family;Available 24 hours/day Type of Home: House Home Access: Ramped entrance (per daughter he walks up ramp)     Home Layout: Two level Home Equipment: Walker - 2 wheels;Toilet riser;Shower seat;Grab bars - tub/shower;Cane - single  point Additional Comments: 2 golf carts (50 acres)    Prior Function Level of Independence: Needs assistance   Gait / Transfers Assistance Needed: uses RW; but sometimes "furniture walks"; reports he has fallen this year but not sure how many times  ADL's / Homemaking Assistance Needed: does independently until recent illness        Hand Dominance   Dominant Hand:  Right    Extremity/Trunk Assessment   Upper Extremity Assessment Upper Extremity Assessment: Defer to OT evaluation    Lower Extremity Assessment Lower Extremity Assessment: Generalized weakness    Cervical / Trunk Assessment Cervical / Trunk Assessment: Kyphotic  Communication   Communication: HOH  Cognition Arousal/Alertness: Awake/alert Behavior During Therapy: WFL for tasks assessed/performed Overall Cognitive Status: History of cognitive impairments - at baseline                                 General Comments: pt stated home set-up incorrectly (daughter called and provided correct information); not oriented to month, +year, +place and situation      General Comments General comments (skin integrity, edema, etc.): on 4L on arrival; slight shortness of breath with talking with sats 89-90%; once EOB sats 92% on 4L; with standing/walking decr to 83% on 4L and incr to 6L with incr to 85% and gradual increase to 89-90%; returned to 4L with sats 88-90% seated at rest    Exercises     Assessment/Plan    PT Assessment Patient needs continued PT services  PT Problem List Decreased strength;Decreased activity tolerance;Decreased balance;Decreased mobility;Decreased cognition;Decreased knowledge of use of DME;Decreased safety awareness;Decreased knowledge of precautions;Cardiopulmonary status limiting activity       PT Treatment Interventions DME instruction;Gait training;Functional mobility training;Therapeutic activities;Therapeutic exercise;Balance training;Cognitive remediation;Patient/family education    PT Goals (Current goals can be found in the Care Plan section)  Acute Rehab PT Goals Patient Stated Goal: return home PT Goal Formulation: With patient/family Time For Goal Achievement: 06/10/20 Potential to Achieve Goals: Good    Frequency Min 3X/week   Barriers to discharge        Co-evaluation               AM-PAC PT "6 Clicks" Mobility   Outcome Measure Help needed turning from your back to your side while in a flat bed without using bedrails?: A Little Help needed moving from lying on your back to sitting on the side of a flat bed without using bedrails?: A Lot Help needed moving to and from a bed to a chair (including a wheelchair)?: A Little Help needed standing up from a chair using your arms (e.g., wheelchair or bedside chair)?: A Little Help needed to walk in hospital room?: A Little Help needed climbing 3-5 steps with a railing? : Total 6 Click Score: 15    End of Session Equipment Utilized During Treatment: Gait belt;Oxygen Activity Tolerance: Patient limited by fatigue;Treatment limited secondary to medical complications (Comment) Patient left: in chair;with call bell/phone within reach (chair alarm pad in chair--no chair alarm box) Nurse Communication: Mobility status;Other (comment) (degree of desaturation) PT Visit Diagnosis: Muscle weakness (generalized) (M62.81);Difficulty in walking, not elsewhere classified (R26.2)    Time: 5400-8676 PT Time Calculation (min) (ACUTE ONLY): 61 min   Charges:   PT Evaluation $PT Eval Low Complexity: 1 Low PT Treatments $Therapeutic Activity: 23-37 mins         Arby Barrette, PT Pager [phone]  Jeanie Cooks Toniqua Melamed 05/27/2020, 4:13 PM

## 2020-05-27 NOTE — Progress Notes (Signed)
PROGRESS NOTE                                                                                                                                                                                                             Patient Demographics:    Craig Chapman, is a 86 y.o. male, DOB - 01-13-1928, VEH:[id number]  Outpatient Primary MD for the patient is Lavone Orn, MD    LOS - 1  Admit date - 05/31/2020    Chief Complaint  Patient presents with  . Altered Mental Status       Brief Narrative  Craig Chapman is a 86 y.o. male with medical history significant of HLD, paroxysmal A. Fib, remote history of lymphoma, remote history of lung cancer status post left pneumonectomy, presented with confusion and short of breath, he was diagnosed with acute hypoxic respiratory failure due to COVID-19 pneumonia and admitted to the hospital.  Unfortunately patient was not able to get the vaccine so far.   Subjective:    Craig Chapman today has, No headache, No chest pain, No abdominal pain - No Nausea, No new weakness tingling or numbness, mild cough but improved shortness of breath.   Assessment  & Plan :     1. Acute Hypoxic Resp. Failure due to Acute Covid 19 Viral Pneumonitis during the ongoing 2020 Covid 19 Pandemic - he seems to have moderate disease has been started on IV steroids and remdesivir combination, continue to monitor closely.  Poor chronic candidate for Actemra due to advanced age, frail status and multiple medical comorbidities.  Encouraged the patient to sit up in chair in the daytime use I-S and flutter valve for pulmonary toiletry and then prone in bed when at night.  Will advance activity and titrate down oxygen as possible.   SpO2: 93 % O2 Flow Rate (L/min): 3 L/min  Recent Labs  Lab 06/03/2020 1149 06/03/2020 1200 05/27/20 0130  CRP  --   --  6.9*  DDIMER  --   --  3.02*  BNP  --  361.5*  --   SARSCOV2NAA  POSITIVE*  --   --     Hepatic Function Latest Ref Rng & Units 05/27/2020 05/15/2020 08/16/2019  Total Protein 6.5 - 8.1 g/dL 6.9 6.4(L) 7.3  Albumin 3.5 - 5.0 g/dL 2.4(L) 2.4(L) 3.6  AST 15 -  41 U/L 55(H) 55(H) 51(H)  ALT 0 - 44 U/L 25 25 37  Alk Phosphatase 38 - 126 U/L 67 63 50  Total Bilirubin 0.3 - 1.2 mg/dL 0.6 0.8 0.6    2.  History of lung cancer with questionable right hilar mass.  History of  left-sided lung lobectomy, outpatient follow-up with PCP and pulmonologist post discharge.  Does not appear to be a candidate for invasive testing or procedures.  3.  Anemia with evidence of some iron deficiency.  Placed on oral iron, continue PPI, outpatient age-appropriate anemia work-up per DCP.  Currently not a candidate for EGD or colonoscopy.  4.  Dyslipidemia on statin.  5.  Hypertension.  As needed hydralazine and monitor.   Condition - Extremely Guarded  Family Communication  :  Craig Chapman [phone] on 05/27/2020  Code Status : No intubation  Consults  : None  Procedures  : None  PUD Prophylaxis : PPI  Disposition Plan  :    Status is: Inpatient  Remains inpatient appropriate because:IV treatments appropriate due to intensity of illness or inability to take PO   Dispo: The patient is from: Home              Anticipated d/c is to: Home              Anticipated d/c date is: > 3 days              Patient currently is not medically stable to d/c.   DVT Prophylaxis  :  Lovenox    Lab Results  Component Value Date   PLT 173 05/27/2020    Diet :  Diet Order            DIET SOFT Room service appropriate? Yes; Fluid consistency: Thin  Diet effective now                  Inpatient Medications  Scheduled Meds: . aspirin EC  325 mg Oral Daily  . atorvastatin  40 mg Oral q1800  . dexamethasone (DECADRON) injection  6 mg Intravenous Q24H  . docusate sodium  200 mg Oral BID  . enoxaparin (LOVENOX) injection  60 mg Subcutaneous Q24H  . ferrous sulfate  325  mg Oral BID WC  . Ipratropium-Albuterol  1 puff Inhalation Q6H  . mouth rinse  15 mL Mouth Rinse BID  . pantoprazole  40 mg Oral Daily   Continuous Infusions: . remdesivir 100 mg in NS 100 mL     PRN Meds:.acetaminophen, guaiFENesin-dextromethorphan, senna-docusate  Antibiotics  :    Anti-infectives (From admission, onward)   Start     Dose/Rate Route Frequency Ordered Stop   05/27/20 1000  remdesivir 100 mg in sodium chloride 0.9 % 100 mL IVPB     Discontinue    "Followed by" Linked Group Details   100 mg 200 mL/hr over 30 Minutes Intravenous Daily 05/18/2020 1348 05/31/20 0959   05/27/2020 1400  remdesivir 200 mg in sodium chloride 0.9% 250 mL IVPB       "Followed by" Linked Group Details   200 mg 580 mL/hr over 30 Minutes Intravenous Once 05/21/2020 1348 05/28/2020 1715       Time Spent in minutes  30   Lala Lund M.D on 05/27/2020 at 8:57 AM  To page go to [email or web address] - password [email or web address]  [facility] -  Office  [phone]  See all Orders from today for further details    Objective:   Vitals:  06/05/2020 1615 05/23/2020 1701 05/28/2020 2108 05/27/20 0423  BP: (!) 142/86 (!) 140/94 (!) 121/59 (!) 142/75  Pulse: 63 76 64   Resp: (!) _0 Temp:  97.7 F (36.5 C) 97.7 F (36.5 C) 97.7 F (36.5 C)  TempSrc:  Oral Oral Axillary  SpO2: 98% 91% 93%   Weight:      Height:        Wt Readings from Last 3 Encounters:  06/03/2020 104.3 kg  01/17/20 103.8 kg  09/19/19 101.2 kg     Intake/Output Summary (Last 24 hours) at 05/27/2020 0857 Last data filed at 05/27/2020 0425 Gross per 24 hour  Intake 490 ml  Output 1350 ml  Net -860 ml     Physical Exam  Awake Alert, No new F.N deficits, Normal affect Craig Chapman.AT,PERRAL Supple Neck,No JVD, No cervical lymphadenopathy appriciated.  Symmetrical Chest wall movement, Good air movement bilaterally, coarse bibasilar breath RRR,No Gallops,Rubs or coarse bibasilar new Murmurs, No Parasternal Heave +ve B.Sounds, Abd  Soft, No tenderness, No organomegaly appriciated, No rebound - guarding or rigidity. No Cyanosis, Clubbing or edema, No new Rash or bruise      Data Review:    CBC Recent Labs  Lab 05/25/2020 1200 05/28/2020 1332 05/27/20 0130  WBC 3.0*  --  1.8*  HGB 8.6* 11.9* 9.7*  HCT 27.5* 35.0* 30.7*  PLT 148*  --  173  MCV 92.6  --  92.7  MCH 29.0  --  29.3  MCHC 31.3  --  31.6  RDW 15.3  --  15.1  LYMPHSABS 0.6*  --  0.6*  MONOABS 0.3  --  0.1  EOSABS 0.0  --  0.0  BASOSABS 0.0  --  0.0    Chemistries  Recent Labs  Lab 05/23/2020 1200 05/17/2020 1332 05/27/20 0130  NA 136 139 138  K 3.3* 3.5 4.1  CL 105  --  105  CO2 22  --  27  GLUCOSE 116*  --  143*  BUN 18  --  15  CREATININE 0.93  --  0.79  CALCIUM 7.5*  --  8.1*  AST 55*  --  55*  ALT 25  --  25  ALKPHOS 63  --  67  BILITOT 0.8  --  0.6  MG  --   --  2.1  INR 1.5*  --   --      ------------------------------------------------------------------------------------------------------------------ No results for input(s): CHOL, HDL, LDLCALC, TRIG, CHOLHDL, LDLDIRECT in the last 72 hours.  Lab Results  Component Value Date   HGBA1C 5.4 08/16/2019   ------------------------------------------------------------------------------------------------------------------ No results for input(s): TSH, T4TOTAL, T3FREE, THYROIDAB in the last 72 hours.  Invalid input(s): FREET3  Cardiac Enzymes No results for input(s): CKMB, TROPONINI, MYOGLOBIN in the last 168 hours.  Invalid input(s): CK ------------------------------------------------------------------------------------------------------------------    Component Value Date/Time   BNP 361.5 (H) 05/11/2020 1200    Micro Results Recent Results (from the past 240 hour(s))  Blood culture (routine x 2)     Status: None (Preliminary result)   Collection Time: 05/22/2020 11:41 AM   Specimen: BLOOD RIGHT FOREARM  Result Value Ref Range Status   Specimen Description BLOOD RIGHT  FOREARM  Final   Special Requests   Final    BOTTLES DRAWN AEROBIC AND ANAEROBIC Blood Culture adequate volume   Culture   Final    NO GROWTH < 24 HOURS Performed at [facility], [place] [address] [place], [place] [address]    Report  Status PENDING  Incomplete  Blood culture (routine x 2)     Status: None (Preliminary result)   Collection Time: 05/14/2020 11:46 AM   Specimen: BLOOD RIGHT HAND  Result Value Ref Range Status   Specimen Description BLOOD RIGHT HAND  Final   Special Requests   Final    BOTTLES DRAWN AEROBIC AND ANAEROBIC Blood Culture results may not be optimal due to an inadequate volume of blood received in culture bottles   Culture   Final    NO GROWTH < 24 HOURS Performed at [facility], [place] [address], [place], [place] [address]    Report Status PENDING  Incomplete  SARS Coronavirus 2 by RT PCR (hospital order, performed in [facility] hospital lab) Nasopharyngeal Nasopharyngeal Swab     Status: Abnormal   Collection Time: 05/31/2020 11:49 AM   Specimen: Nasopharyngeal Swab  Result Value Ref Range Status   SARS Coronavirus 2 POSITIVE (A) NEGATIVE Final    Comment: RESULT CALLED TO, READ BACK BY AND VERIFIED WITH: RN S PLUNKETT AT 1339 05/12/2020 BY L BENFIELD (NOTE) SARS-CoV-2 target nucleic acids are DETECTED  SARS-CoV-2 RNA is generally detectable in upper respiratory specimens  during the acute phase of infection.  Positive results are indicative  of the presence of the identified virus, but do not rule out bacterial infection or co-infection with other pathogens not detected by the test.  Clinical correlation with patient history and  other diagnostic information is necessary to determine patient infection status.  The expected result is negative.  Fact Sheet for Patients:   StrictlyIdeas.no   Fact Sheet for Healthcare Providers:   BankingDealers.co.za    This test is not yet approved or cleared by  the [place] FDA and  has been authorized for detection and/or diagnosis of SARS-CoV-2 by FDA under an Emergency Use Authorization (EUA).  This EUA will remain in effect (meanin g this test can be used) for the duration of  the COVID-19 declaration under Section 564(b)(1) of the Act, 21 U.S.C. section 360-bbb-3(b)(1), unless the authorization is terminated or revoked sooner.  Performed at [facility], [place] [address], [place], [place] [address]     Radiology Reports DG Chest 1 View  Result Date: 05/24/2020 CLINICAL DATA:  Altered mental status, recent UTI, history of lung cancer, lymphoma, hypertension, atrial fibrillation EXAM: CHEST  1 VIEW COMPARISON:  08/21/2019 FINDINGS: Post LEFT pneumonectomy with persistent opacification of LEFT hemithorax. Atherosclerotic calcification aorta. Perihilar infiltrates identified in RIGHT upper lobe consistent with pneumonia. Enlargement of RIGHT pulmonary hilum, could reflect prominent vascular structure, adenopathy, mass, or artifact from slight rotation to the LEFT. No pneumothorax. Bones demineralized with chronic RIGHT rotator cuff tear. IMPRESSION: Postsurgical changes LEFT pneumonectomy. New RIGHT upper lobe infiltrate consistent with pneumonia. Question RIGHT hilar mass/adenopathy versus artifact from rotation. Electronically Signed   By: Lavonia Dana M.D.   On: 06/06/2020 12:49   CT Head Wo Contrast  Result Date: 05/30/2020 CLINICAL DATA:  Altered mental status EXAM: CT HEAD WITHOUT CONTRAST TECHNIQUE: Contiguous axial images were obtained from the base of the skull through the vertex without intravenous contrast. COMPARISON:  08/16/2019 FINDINGS: Brain: Atrophic changes and scattered lacunar infarcts and chronic white matter ischemic change are seen and relatively stable from the prior exam. No findings to suggest acute hemorrhage, acute infarction or space-occupying mass lesion are noted. Vascular: No hyperdense vessel or unexpected  calcification. Skull: Normal. Negative for fracture or focal lesion. Sinuses/Orbits: No acute finding. Other: None.  IMPRESSION: Atrophic and ischemic changes without acute abnormality. Electronically Signed   By: Inez Catalina M.D.   On: 05/10/2020 12:52

## 2020-05-27 NOTE — Progress Notes (Signed)
Lower extremity venous has been completed.   Preliminary results in CV Proc.   Craig Chapman 05/27/2020 1:53 PM

## 2020-05-28 ENCOUNTER — Inpatient Hospital Stay (HOSPITAL_COMMUNITY): Payer: Medicare Other

## 2020-05-28 LAB — COMPREHENSIVE METABOLIC PANEL
ALT: 27 U/L (ref 0–44)
AST: 59 U/L — ABNORMAL HIGH (ref 15–41)
Albumin: 2.6 g/dL — ABNORMAL LOW (ref 3.5–5.0)
Alkaline Phosphatase: 69 U/L (ref 38–126)
Anion gap: 10 (ref 5–15)
BUN: 18 mg/dL (ref 8–23)
CO2: 24 mmol/L (ref 22–32)
Calcium: 8.5 mg/dL — ABNORMAL LOW (ref 8.9–10.3)
Chloride: 103 mmol/L (ref 98–111)
Creatinine, Ser: 0.8 mg/dL (ref 0.61–1.24)
GFR calc Af Amer: >60 mL/min (ref >60)
GFR calc non Af Amer: >60 mL/min (ref >60)
Glucose, Bld: 121 mg/dL — ABNORMAL HIGH (ref 70–99)
Potassium: 3.9 mmol/L (ref 3.5–5.1)
Sodium: 137 mmol/L (ref 135–145)
Total Bilirubin: 0.7 mg/dL (ref 0.3–1.2)
Total Protein: 6.9 g/dL (ref 6.5–8.1)

## 2020-05-28 LAB — CBC WITH DIFFERENTIAL/PLATELET
Abs Immature Granulocytes: 0.08 10*3/uL — ABNORMAL HIGH (ref 0.00–0.07)
Basophils Absolute: 0 10*3/uL (ref 0.0–0.1)
Basophils Relative: 0 %
Eosinophils Absolute: 0 10*3/uL (ref 0.0–0.5)
Eosinophils Relative: 0 %
HCT: 33.7 % — ABNORMAL LOW (ref 39.0–52.0)
Hemoglobin: 10.4 g/dL — ABNORMAL LOW (ref 13.0–17.0)
Immature Granulocytes: 2 %
Lymphocytes Relative: 19 %
Lymphs Abs: 0.9 10*3/uL (ref 0.7–4.0)
MCH: 28.6 pg (ref 26.0–34.0)
MCHC: 30.9 g/dL (ref 30.0–36.0)
MCV: 92.6 fL (ref 80.0–100.0)
Monocytes Absolute: 0.4 10*3/uL (ref 0.1–1.0)
Monocytes Relative: 8 %
Neutro Abs: 3.6 10*3/uL (ref 1.7–7.7)
Neutrophils Relative %: 71 %
Platelets: 201 10*3/uL (ref 150–400)
RBC: 3.64 MIL/uL — ABNORMAL LOW (ref 4.22–5.81)
RDW: 15.3 % (ref 11.5–15.5)
WBC: 5 10*3/uL (ref 4.0–10.5)
nRBC: 0 % (ref 0.0–0.2)

## 2020-05-28 LAB — MAGNESIUM: Magnesium: 1.9 mg/dL (ref 1.7–2.4)

## 2020-05-28 LAB — BRAIN NATRIURETIC PEPTIDE: B Natriuretic Peptide: 236.1 pg/mL — ABNORMAL HIGH (ref 0.0–100.0)

## 2020-05-28 LAB — C-REACTIVE PROTEIN: CRP: 6.7 mg/dL — ABNORMAL HIGH (ref <1.0)

## 2020-05-28 LAB — PROCALCITONIN: Procalcitonin: <0.1 ng/mL

## 2020-05-28 LAB — D-DIMER, QUANTITATIVE: D-Dimer, Quant: 2.82 ug/mL-FEU — ABNORMAL HIGH (ref 0.00–0.50)

## 2020-05-28 MED ORDER — DEXAMETHASONE SODIUM PHOSPHATE 4 MG/ML IJ SOLN
4.0000 mg | Freq: Once | INTRAMUSCULAR | Status: AC
  Administered 2020-05-28: 4 mg via INTRAVENOUS
  Filled 2020-05-28: qty 1

## 2020-05-28 MED ORDER — MAGNESIUM SULFATE IN D5W 1-5 GM/100ML-% IV SOLN
1.0000 g | Freq: Once | INTRAVENOUS | Status: AC
  Administered 2020-05-28: 1 g via INTRAVENOUS
  Filled 2020-05-28: qty 100

## 2020-05-28 MED ORDER — HALOPERIDOL LACTATE 5 MG/ML IJ SOLN: 2.0000 mg | Freq: Once | INTRAMUSCULAR | Status: DC

## 2020-05-28 MED ORDER — FUROSEMIDE 10 MG/ML IJ SOLN
40.0000 mg | Freq: Once | INTRAMUSCULAR | Status: AC
  Administered 2020-05-28: 40 mg via INTRAVENOUS
  Filled 2020-05-28: qty 4

## 2020-05-28 MED ORDER — METOPROLOL TARTRATE 50 MG PO TABS
50.0000 mg | ORAL_TABLET | Freq: Two times a day (BID) | ORAL | Status: DC
  Administered 2020-05-28 – 2020-06-01 (×7): 50 mg via ORAL
  Filled 2020-05-28 (×8): qty 1

## 2020-05-28 MED ORDER — HALOPERIDOL LACTATE 5 MG/ML IJ SOLN
2.0000 mg | Freq: Four times a day (QID) | INTRAMUSCULAR | Status: DC | PRN
  Administered 2020-05-28 – 2020-05-31 (×4): 2 mg via INTRAVENOUS
  Filled 2020-05-28 (×4): qty 1

## 2020-05-28 MED ORDER — DEXAMETHASONE SODIUM PHOSPHATE 10 MG/ML IJ SOLN
10.0000 mg | INTRAMUSCULAR | Status: DC
  Administered 2020-05-29: 10 mg via INTRAVENOUS
  Filled 2020-05-28: qty 1

## 2020-05-28 MED ORDER — POTASSIUM CHLORIDE 20 MEQ PO PACK
40.0000 meq | PACK | Freq: Once | ORAL | Status: AC
  Administered 2020-05-28: 40 meq via ORAL
  Filled 2020-05-28: qty 2

## 2020-05-28 NOTE — Progress Notes (Signed)
patient's VS MEWs turned to yellow for respiration and heart rate. On call MD notified. Called the respitory bed side and discussed about patient's conditions. Started 02 with HFNC 12 L. Will continue monitor.

## 2020-05-28 NOTE — Plan of Care (Signed)
  Problem: Activity: Goal: Risk for activity intolerance will decrease Outcome: Progressing   

## 2020-05-28 NOTE — Progress Notes (Addendum)
PROGRESS NOTE                                                                                                                                                                                                             Patient Demographics:    Craig Chapman, is a 86 y.o. male, DOB - 1928/07/28, MBT:[id number]  Outpatient Primary MD for the patient is Lavone Orn, MD    LOS - 2  Admit date - 05/10/2020    Chief Complaint  Patient presents with  . Altered Mental Status       Brief Narrative  Craig Chapman is a 86 y.o. male with medical history significant of HLD, paroxysmal A. Fib, remote history of lymphoma, remote history of lung cancer status post left pneumonectomy, presented with confusion and short of breath, he was diagnosed with acute hypoxic respiratory failure due to COVID-19 pneumonia and admitted to the hospital.  Unfortunately patient was not able to get the vaccine so far.   Subjective:   Patient in bed, appears comfortable, denies any headache, no fever, no chest pain or pressure,+ve shortness of breath , no abdominal pain. No focal weakness.   Assessment  & Plan :     1. Acute Hypoxic Resp. Failure due to Acute Covid 19 Viral Pneumonitis during the ongoing 2020 Covid 19 Pandemic - he seems to have moderate disease has been started on IV steroids and remdesivir combination, continue to monitor closely.  Poor chronic candidate for Actemra due to advanced age, frail status and multiple medical comorbidities.  Also involve speech therapy to make sure he is swallowing safely.  Addendum.  3:45 PM patient's pulmonary status unfortunately is rapidly worsening, will try gentle diuresis as well, continue on soft diet continue on steroids and remdesivir.  This likely is progressive parenchymal injury due to COVID-19 pneumonia which can be fatal in this age group.  Explained to the family clearly.  Full medical  treatment but DNR.  At this age if he develops respiratory failure he will not benefit from CPR, as it is he wanted to be no intubation.   Encouraged the patient to sit up in chair in the daytime use I-S and flutter valve for pulmonary toiletry and then prone in bed when at night.  Will advance activity and titrate down oxygen as possible.   SpO2:  93 % O2 Flow Rate (L/min): 9 L/min FiO2 (%): 100 %  Recent Labs  Lab 05/28/2020 1149 05/19/2020 1200 05/27/20 0130 05/28/20 0321  CRP  --   --  6.9* 6.7*  DDIMER  --   --  3.02* 2.82*  BNP  --  361.5* 530.8* 236.1*  PROCALCITON  --   --  <0.10 <0.10  SARSCOV2NAA POSITIVE*  --   --   --     Hepatic Function Latest Ref Rng & Units 05/28/2020 05/27/2020 06/05/2020  Total Protein 6.5 - 8.1 g/dL 6.9 6.9 6.4(L)  Albumin 3.5 - 5.0 g/dL 2.6(L) 2.4(L) 2.4(L)  AST 15 - 41 U/L 59(H) 55(H) 55(H)  ALT 0 - 44 U/L _0 Alk Phosphatase 38 - 126 U/L 69 67 63  Total Bilirubin 0.3 - 1.2 mg/dL 0.7 0.6 0.8    2.  History of lung cancer with questionable right hilar mass.  History of  left-sided lung lobectomy, outpatient follow-up with PCP and pulmonologist post discharge.  Does not appear to be a candidate for invasive testing or procedures.  3.  Anemia with evidence of some iron deficiency.  Placed on oral iron, continue PPI, outpatient age-appropriate anemia work-up per DCP.  Currently not a candidate for EGD or colonoscopy.  4.  Dyslipidemia on statin.  5.  Hypertension.  As needed hydralazine and monitor.  6.  Toxic encephalopathy.  Due to Covid infection.  Haldol, minimize narcotics and benzodiazepines.  Supportive care.  Family has already been warned that this might happen on 05/27/2020 and reaffirmed on 05/28/2020.    Condition - Extremely Guarded  Family Communication  :  Clif Serio [phone] on 05/27/2020, 05/28/20  Code Status : DNR  Consults  : None  Procedures  : None  PUD Prophylaxis : PPI  Disposition Plan  :    Status is:  Inpatient  Remains inpatient appropriate because:IV treatments appropriate due to intensity of illness or inability to take PO   Dispo: The patient is from: Home              Anticipated d/c is to: Home              Anticipated d/c date is: > 3 days              Patient currently is not medically stable to d/c.   DVT Prophylaxis  :  Lovenox    Lab Results  Component Value Date   PLT 201 05/28/2020    Diet :  Diet Order            DIET SOFT Room service appropriate? Yes; Fluid consistency: Thin  Diet effective now                  Inpatient Medications  Scheduled Meds: . aspirin EC  81 mg Oral Daily  . atorvastatin  40 mg Oral q1800  . dexamethasone (DECADRON) injection  6 mg Intravenous Q24H  . docusate sodium  200 mg Oral BID  . enoxaparin (LOVENOX) injection  60 mg Subcutaneous Q24H  . ferrous sulfate  325 mg Oral BID WC  . haloperidol lactate  2 mg Intravenous Once  . Ipratropium-Albuterol  1 puff Inhalation Q6H  . mouth rinse  15 mL Mouth Rinse BID  . pantoprazole  40 mg Oral Daily   Continuous Infusions: . remdesivir 100 mg in NS 100 mL 100 mg (05/28/20 1039)   PRN Meds:.acetaminophen, guaiFENesin-dextromethorphan, hydrALAZINE, senna-docusate  Antibiotics  :  Anti-infectives (From admission, onward)   Start     Dose/Rate Route Frequency Ordered Stop   05/27/20 1000  remdesivir 100 mg in sodium chloride 0.9 % 100 mL IVPB     Discontinue    "Followed by" Linked Group Details   100 mg 200 mL/hr over 30 Minutes Intravenous Daily 05/27/2020 1348 05/31/20 0959   05/11/2020 1400  remdesivir 200 mg in sodium chloride 0.9% 250 mL IVPB       "Followed by" Linked Group Details   200 mg 580 mL/hr over 30 Minutes Intravenous Once 05/22/2020 1348 05/08/2020 1715       Time Spent in minutes  30   Lala Lund M.D on 05/28/2020 at 1:24 PM  To page go to [email or web address] - password [facility]  [facility] -  Office  [phone]  See all Orders from today for  further details    Objective:   Vitals:   05/28/20 0509 05/28/20 0526 05/28/20 0740 05/28/20 1255  BP:  133/69 139/79 138/67  Pulse: 87 86 84 78  Resp: (!) 26 (!) 25 (!) 21 20  Temp:  97.7 F (36.5 C) 97.7 F (36.5 C) 98.7 F (37.1 C)  TempSrc:  Oral Oral Axillary  SpO2: 92% 91% 91% 93%  Weight:      Height:        Wt Readings from Last 3 Encounters:  05/23/2020 104.3 kg  01/17/20 103.8 kg  09/19/19 101.2 kg     Intake/Output Summary (Last 24 hours) at 05/28/2020 1324 Last data filed at 05/28/2020 1039 Gross per 24 hour  Intake 460 ml  Output 975 ml  Net -515 ml     Physical Exam  Awake , confused, No new F.N deficits,   [place].AT,PERRAL Supple Neck,No JVD, No cervical lymphadenopathy appriciated.  Symmetrical Chest wall movement, Good air movement bilaterally, CTAB RRR,No Gallops, Rubs or new Murmurs, No Parasternal Heave +ve B.Sounds, Abd Soft, No tenderness, No organomegaly appriciated, No rebound - guarding or rigidity. No Cyanosis, Clubbing or edema, No new Rash or bruise    Data Review:    CBC Recent Labs  Lab 06/06/2020 1200 05/17/2020 1332 05/27/20 0130 05/28/20 0321  WBC 3.0*  --  1.8* 5.0  HGB 8.6* 11.9* 9.7* 10.4*  HCT 27.5* 35.0* 30.7* 33.7*  PLT 148*  --  173 201  MCV 92.6  --  92.7 92.6  MCH 29.0  --  29.3 28.6  MCHC 31.3  --  31.6 30.9  RDW 15.3  --  15.1 15.3  LYMPHSABS 0.6*  --  0.6* 0.9  MONOABS 0.3  --  0.1 0.4  EOSABS 0.0  --  0.0 0.0  BASOSABS 0.0  --  0.0 0.0    Chemistries  Recent Labs  Lab 05/10/2020 1200 05/28/2020 1332 05/27/20 0130 05/28/20 0321  NA 136 139 138 137  K 3.3* 3.5 4.1 3.9  CL 105  --  105 103  CO2 22  --  27 24  GLUCOSE 116*  --  143* 121*  BUN 18  --  15 18  CREATININE 0.93  --  0.79 0.80  CALCIUM 7.5*  --  8.1* 8.5*  AST 55*  --  55* 59*  ALT 25  --  25 27  ALKPHOS 63  --  67 69  BILITOT 0.8  --  0.6 0.7  MG  --   --  2.1 1.9  INR 1.5*  --   --   --        ------------------------------------------------------------------------------------------------------------------  No results for input(s): CHOL, HDL, LDLCALC, TRIG, CHOLHDL, LDLDIRECT in the last 72 hours.  Lab Results  Component Value Date   HGBA1C 5.4 08/16/2019   ------------------------------------------------------------------------------------------------------------------ No results for input(s): TSH, T4TOTAL, T3FREE, THYROIDAB in the last 72 hours.  Invalid input(s): FREET3  Cardiac Enzymes No results for input(s): CKMB, TROPONINI, MYOGLOBIN in the last 168 hours.  Invalid input(s): CK ------------------------------------------------------------------------------------------------------------------    Component Value Date/Time   BNP 236.1 (H) 05/28/2020 0321    Micro Results Recent Results (from the past 240 hour(s))  Blood culture (routine x 2)     Status: None (Preliminary result)   Collection Time: 06/01/2020 11:41 AM   Specimen: BLOOD RIGHT FOREARM  Result Value Ref Range Status   Specimen Description BLOOD RIGHT FOREARM  Final   Special Requests   Final    BOTTLES DRAWN AEROBIC AND ANAEROBIC Blood Culture adequate volume   Culture   Final    NO GROWTH 2 DAYS Performed at [facility], [address] [place], [place] [address]    Report Status PENDING  Incomplete  Blood culture (routine x 2)     Status: None (Preliminary result)   Collection Time: 05/19/2020 11:46 AM   Specimen: BLOOD RIGHT HAND  Result Value Ref Range Status   Specimen Description BLOOD RIGHT HAND  Final   Special Requests   Final    BOTTLES DRAWN AEROBIC AND ANAEROBIC Blood Culture results may not be optimal due to an inadequate volume of blood received in culture bottles   Culture   Final    NO GROWTH 2 DAYS Performed at [facility], [place] [address], [place], [place] [address]    Report Status PENDING  Incomplete  SARS Coronavirus 2 by RT PCR (hospital order, performed in  [facility] hospital lab) Nasopharyngeal Nasopharyngeal Swab     Status: Abnormal   Collection Time: 05/16/2020 11:49 AM   Specimen: Nasopharyngeal Swab  Result Value Ref Range Status   SARS Coronavirus 2 POSITIVE (A) NEGATIVE Final    Comment: RESULT CALLED TO, READ BACK BY AND VERIFIED WITH: RN S PLUNKETT AT 1339 05/20/2020 BY L BENFIELD (NOTE) SARS-CoV-2 target nucleic acids are DETECTED  SARS-CoV-2 RNA is generally detectable in upper respiratory specimens  during the acute phase of infection.  Positive results are indicative  of the presence of the identified virus, but do not rule out bacterial infection or co-infection with other pathogens not detected by the test.  Clinical correlation with patient history and  other diagnostic information is necessary to determine patient infection status.  The expected result is negative.  Fact Sheet for Patients:   StrictlyIdeas.no   Fact Sheet for Healthcare Providers:   BankingDealers.co.za    This test is not yet approved or cleared by the [place] FDA and  has been authorized for detection and/or diagnosis of SARS-CoV-2 by FDA under an Emergency Use Authorization (EUA).  This EUA will remain in effect (meanin g this test can be used) for the duration of  the COVID-19 declaration under Section 564(b)(1) of the Act, 21 U.S.C. section 360-bbb-3(b)(1), unless the authorization is terminated or revoked sooner.  Performed at [facility], [place] [address], [place], [place] [address]     Radiology Reports DG Chest 1 View  Result Date: 05/20/2020 CLINICAL DATA:  Altered mental status, recent UTI, history of lung cancer, lymphoma, hypertension, atrial fibrillation EXAM: CHEST  1 VIEW COMPARISON:  08/21/2019 FINDINGS: Post LEFT pneumonectomy with persistent opacification  of LEFT hemithorax. Atherosclerotic calcification aorta. Perihilar infiltrates identified in RIGHT upper lobe consistent  with pneumonia. Enlargement of RIGHT pulmonary hilum, could reflect prominent vascular structure, adenopathy, mass, or artifact from slight rotation to the LEFT. No pneumothorax. Bones demineralized with chronic RIGHT rotator cuff tear. IMPRESSION: Postsurgical changes LEFT pneumonectomy. New RIGHT upper lobe infiltrate consistent with pneumonia. Question RIGHT hilar mass/adenopathy versus artifact from rotation. Electronically Signed   By: Lavonia Dana M.D.   On: 05/30/2020 12:49   CT Head Wo Contrast  Result Date: 05/25/2020 CLINICAL DATA:  Altered mental status EXAM: CT HEAD WITHOUT CONTRAST TECHNIQUE: Contiguous axial images were obtained from the base of the skull through the vertex without intravenous contrast. COMPARISON:  08/16/2019 FINDINGS: Brain: Atrophic changes and scattered lacunar infarcts and chronic white matter ischemic change are seen and relatively stable from the prior exam. No findings to suggest acute hemorrhage, acute infarction or space-occupying mass lesion are noted. Vascular: No hyperdense vessel or unexpected calcification. Skull: Normal. Negative for fracture or focal lesion. Sinuses/Orbits: No acute finding. Other: None. IMPRESSION: Atrophic and ischemic changes without acute abnormality. Electronically Signed   By: Inez Catalina M.D.   On: 05/20/2020 12:52   DG Chest Port 1 View  Result Date: 05/28/2020 CLINICAL DATA:  Shortness of breath, COVID-19 positive. EXAM: PORTABLE CHEST 1 VIEW COMPARISON:  May 27, 2020. FINDINGS: Status post left pneumonectomy. No pneumothorax is noted stable right upper lobe airspace opacity is noted consistent with pneumonia. Mildly increased right basilar opacity is noted concerning for worsening pneumonia. Bony thorax is unremarkable. IMPRESSION: Stable right upper lobe airspace opacity is noted consistent with pneumonia. Mildly increased right basilar opacity is noted concerning for worsening pneumonia. Electronically Signed   By: Marijo Conception  M.D.   On: 05/28/2020 09:03   DG Chest Port 1 View  Result Date: 05/27/2020 CLINICAL DATA:  Shortness of breath, pneumonia, COVID-19 EXAM: PORTABLE CHEST 1 VIEW COMPARISON:  Portable exam 0649 hours compared to 06/03/2020 FINDINGS: Persistent complete opacification of the LEFT hemithorax postpneumonectomy. Increased RIGHT upper lobe infiltrate consistent with pneumonia. Prominence of RIGHT hilum unchanged. Minimal atelectasis at RIGHT base. No pneumothorax. Bones demineralized. IMPRESSION: Increased RIGHT upper lobe infiltrate consistent with pneumonia. Post LEFT pneumonectomy. Electronically Signed   By: Lavonia Dana M.D.   On: 05/27/2020 09:14   VAS [place] LOWER EXTREMITY VENOUS (DVT)  Result Date: 05/27/2020  Lower Venous DVTStudy Indications: Edema, Swelling, and Rapidly rising D-dimer.  Comparison Study: no prior Performing Technologist: Abram Sander RVS  Examination Guidelines: A complete evaluation includes B-mode imaging, spectral Doppler, color Doppler, and power Doppler as needed of all accessible portions of each vessel. Bilateral testing is considered an integral part of a complete examination. Limited examinations for reoccurring indications may be performed as noted. The reflux portion of the exam is performed with the patient in reverse Trendelenburg.  +---------+---------------+---------+-----------+----------+--------------+ RIGHT    CompressibilityPhasicitySpontaneityPropertiesThrombus Aging +---------+---------------+---------+-----------+----------+--------------+ CFV      Full           Yes      Yes                                 +---------+---------------+---------+-----------+----------+--------------+ SFJ      Full                                                        +---------+---------------+---------+-----------+----------+--------------+  FV Prox  Full                                                         +---------+---------------+---------+-----------+----------+--------------+ FV Mid   Full                                                        +---------+---------------+---------+-----------+----------+--------------+ FV DistalFull                                                        +---------+---------------+---------+-----------+----------+--------------+ PFV      Full                                                        +---------+---------------+---------+-----------+----------+--------------+ POP      Full           Yes      Yes                                 +---------+---------------+---------+-----------+----------+--------------+ PTV      Full                                                        +---------+---------------+---------+-----------+----------+--------------+ PERO     Full                                                        +---------+---------------+---------+-----------+----------+--------------+   +---------+---------------+---------+-----------+----------+--------------+ LEFT     CompressibilityPhasicitySpontaneityPropertiesThrombus Aging +---------+---------------+---------+-----------+----------+--------------+ CFV      Full           Yes      Yes                                 +---------+---------------+---------+-----------+----------+--------------+ SFJ      Full                                                        +---------+---------------+---------+-----------+----------+--------------+ FV Prox  Full                                                        +---------+---------------+---------+-----------+----------+--------------+  FV Mid   Full                                                        +---------+---------------+---------+-----------+----------+--------------+ FV DistalFull                                                         +---------+---------------+---------+-----------+----------+--------------+ PFV      Full                                                        +---------+---------------+---------+-----------+----------+--------------+ POP      Full           Yes      Yes                                 +---------+---------------+---------+-----------+----------+--------------+ PTV      Full                                                        +---------+---------------+---------+-----------+----------+--------------+ PERO                                                  Not visualized +---------+---------------+---------+-----------+----------+--------------+     Summary: BILATERAL: - No evidence of deep vein thrombosis seen in the lower extremities, bilaterally. - No evidence of superficial venous thrombosis in the lower extremities, bilaterally. -   *See table(s) above for measurements and observations. Electronically signed by Servando Snare MD on 05/27/2020 at 5:30:45 PM.    Final

## 2020-05-28 NOTE — Progress Notes (Signed)
Occupational Therapy Evaluation Patient Details Name: Craig Chapman MRN: [id number] DOB: [birth year] Today's Date: 05/28/2020    History of Present Illness 86 y.o. male with medical history significant of HLD, paroxysmal A. Fib, remote history of lymphoma, remote history of lung cancer status post left pneumonectomy, presented with confusion and short of breath.  Patient started to have episodes of confusion and subjective fever, family contacted PCP who suspect patient had UTI and thus started pt on Cipro.  Patient completed 3 days of Cipro, but remained confused. +Acute hypoxic respiratory failure secondary to COVID-19 pneumonia; anemia   Clinical Impression   Patient lives with spouse and other family in a single level house.  PTA patient independent with ADLs and uses RW sometimes for mobility.  Today patient presenting with decreased activity tolerance, strength, balance, and cognition. Required mod assist with getting to EOB and min assist with stand pivot to chair.  Also needing verbal and tactile cues for safety and sequencing with mobility.  Patient started on 9L O2 HFNC at rest and ended on 14L O2 as SpO2 dropping to 79 with mobility (more details below).  Practiced and educated on pursed lip breathing.  Patient with appears to present with urge incontinence, asking for urinal 5 times during session and requiring min assist with LB washing up.  Changed gown with min guard and set up.    Patient's daughter requesting he return home.  If patient continues to require increased O2 and have foor activity tolerance, may benefit from short SNF stay.  Will continue to follow with OT acutely to address the deficits listed below.      Follow Up Recommendations  Supervision/Assistance - 24 hour;Other (comment);Home health OT (Pts daughter requesting HH according to PT)    Equipment Recommendations  3 in 1 bedside commode    Recommendations for Other Services       Precautions / Restrictions  Precautions Precautions: Fall Restrictions Weight Bearing Restrictions: No      Mobility Bed Mobility Overal bed mobility: Needs Assistance Bed Mobility: Supine to Sit     Supine to sit: Mod assist;HOB elevated     General bed mobility comments: Needing assist raising torso upright  Transfers Overall transfer level: Needs assistance Equipment used: Rolling walker (2 wheeled) Transfers: Stand Pivot Transfers   Stand pivot transfers: Min assist       General transfer comment: Assist with power up. Cueing for safety and sequencing.      Balance Overall balance assessment: Needs assistance Sitting-balance support: No upper extremity supported;Feet supported Sitting balance-Leahy Scale: Fair     Standing balance support: Bilateral upper extremity supported Standing balance-Leahy Scale: Poor Standing balance comment: requires bil UE support via RW and min assist                           ADL either performed or assessed with clinical judgement   ADL Overall ADL's : Needs assistance/impaired Eating/Feeding: Set up;Sitting   Grooming: Set up;Supervision/safety;Sitting   Upper Body Bathing: Min guard;Set up;Sitting   Lower Body Bathing: Moderate assistance;Sitting/lateral leans;Sit to/from stand Lower Body Bathing Details (indicate cue type and reason): Due to shortness of breath, fatigue Upper Body Dressing : Set up;Min guard;Sitting   Lower Body Dressing: Minimal assistance;Sitting/lateral leans;Sit to/from stand;Adhering to hip precautions   Toilet Transfer: Minimal assistance;Stand-pivot;BSC;RW           Functional mobility during ADLs: Minimal assistance;Rolling walker General ADL Comments: Limited by decreased activity  tolerance and shortness of breath.       Vision Patient Visual Report: No change from baseline       Perception     Praxis      Pertinent Vitals/Pain Pain Assessment: Faces Faces Pain Scale: Hurts a little bit Pain  Location: general discomfort with shortness of breath Pain Descriptors / Indicators: Discomfort Pain Intervention(s): Limited activity within patient's tolerance;Monitored during session;Repositioned     Hand Dominance Right   Extremity/Trunk Assessment Upper Extremity Assessment Upper Extremity Assessment: Generalized weakness   Lower Extremity Assessment Lower Extremity Assessment: Generalized weakness       Communication Communication Communication: HOH   Cognition Arousal/Alertness: Awake/alert Behavior During Therapy: WFL for tasks assessed/performed Overall Cognitive Status: History of cognitive impairments - at baseline                                 General Comments: Patient a little confused. STM impaired, repeating many questions.  Required verbal and tactile cues for sequencing and following commands. Not oriented to time   General Comments  Patient on 9L O2 via HFNC on arrival with SpO2 87 at rest.  On sitting up at EOB SpO2 decreased to 79.  Worked on pursed lip breathing.  Increased O2 to 11L. and SpO2 increased to 88.  With stand pivot to chair, SpO2 decreased to 80.  Lingering at 80 after ~3 min sitting rest and pursed lip breathing , patient very short of breath.  Increased O2 to 14L and left patient on this.  After ~5 min resting SpO2 at 87.  RN aware of increase     Exercises     Shoulder Instructions      Home Living Family/patient expects to be discharged to:: Private residence Living Arrangements: Spouse/significant other Available Help at Discharge: Family;Available 24 hours/day Type of Home: House Home Access: Ramped entrance     Home Layout: Two level Alternate Level Stairs-Number of Steps: 1 (1 step into living room)   Bathroom Shower/Tub: Occupational psychologist: Standard     Home Equipment: Environmental consultant - 2 wheels;Toilet riser;Shower seat;Grab bars - tub/shower;Cane - single point   Additional Comments: 2 golf carts (50  acres)      Prior Functioning/Environment Level of Independence: Needs assistance  Gait / Transfers Assistance Needed: uses RW; but sometimes "furniture walks"; reports he has fallen this year but not sure how many times ADL's / Homemaking Assistance Needed: does independently until recent illness            OT Problem List: Decreased strength;Decreased range of motion;Decreased activity tolerance;Impaired balance (sitting and/or standing);Decreased cognition;Decreased safety awareness;Decreased knowledge of use of DME or AE;Cardiopulmonary status limiting activity      OT Treatment/Interventions: Self-care/ADL training;Therapeutic exercise;Energy conservation;DME and/or AE instruction;Therapeutic activities;Cognitive remediation/compensation;Patient/family education;Balance training    OT Goals(Current goals can be found in the care plan section) Acute Rehab OT Goals Patient Stated Goal: return home OT Goal Formulation: With patient Time For Goal Achievement: 06/11/20 Potential to Achieve Goals: Good  OT Frequency: Min 2X/week   Barriers to D/C:            Co-evaluation              AM-PAC OT "6 Clicks" Daily Activity     Outcome Measure Help from another person eating meals?: A Little Help from another person taking care of personal grooming?: A Little Help from another person toileting,  which includes using toliet, bedpan, or urinal?: A Lot Help from another person bathing (including washing, rinsing, drying)?: A Lot Help from another person to put on and taking off regular upper body clothing?: A Little Help from another person to put on and taking off regular lower body clothing?: A Lot 6 Click Score: 15   End of Session Equipment Utilized During Treatment: Rolling walker;Gait belt;Oxygen Nurse Communication: Mobility status  Activity Tolerance: Patient tolerated treatment well Patient left: in chair;with call bell/phone within reach;with nursing/sitter in  room  OT Visit Diagnosis: Unsteadiness on feet (R26.81);History of falling (Z91.81);Muscle weakness (generalized) (M62.81);Other symptoms and signs involving cognitive function                Time: 9735-3299 OT Time Calculation (min): 38 min Charges:  OT General Charges $OT Visit: 1 Visit OT Evaluation $OT Eval Moderate Complexity: 1 Mod OT Treatments $Self Care/Home Management : 23-37 mins  August Luz, OTR/L   Phylliss Bob 05/28/2020, 11:26 AM

## 2020-05-28 NOTE — Progress Notes (Signed)
Physical Therapy Treatment Patient Details Name: Craig Chapman MRN: [id number] DOB: Aug 01, 1928 Today's Date: 05/28/2020    History of Present Illness 86 y.o. male with medical history significant of HLD, paroxysmal A. Fib, remote history of lymphoma, remote history of lung cancer status post left pneumonectomy, presented with confusion and short of breath.  Patient started to have episodes of confusion and subjective fever, family contacted PCP who suspect patient had UTI and thus started pt on Cipro.  Patient completed 3 days of Cipro, but remained confused. +Acute hypoxic respiratory failure secondary to COVID-19 pneumonia; anemia    PT Comments    As arrived, noted pt to be leaning forward in chair, reaching towards the floor and appeared in distress. Assisted pt to lean back in chair and noted HFNC off and sats 79%. Re-applied HFNC (?9L), called for nursing assistance, and worked to get pt's feet elevated to keep him from sliding off seat of chair. NT in to assist and able to pull pt up in chair. Sats up to 85% and RN in to assess. RN noted pt had been more confused today and with incr O2 demand. RN requested return pt to bed and pt performed stand-pivot with +2 moderate assist. Patient noted with incr WOB. Once supine with HOB elevated, sats slowly incr to 89%. RN with pt at end of session.     Follow Up Recommendations  Home health PT;Supervision/Assistance - 24 hour (continue to assess; medical decline 7/21)     Equipment Recommendations  None recommended by PT    Recommendations for Other Services       Precautions / Restrictions Precautions Precautions: Fall Restrictions Weight Bearing Restrictions: No    Mobility  Bed Mobility Overal bed mobility: Needs Assistance Bed Mobility: Sit to Sidelying     Supine to sit: Mod assist;HOB elevated   Sit to sidelying: Max assist;+2 for physical assistance General bed mobility comments: pt confused with low sats and required  incr assist  Transfers Overall transfer level: Needs assistance Equipment used: Rolling walker (2 wheeled) Transfers: Stand Pivot Transfers   Stand pivot transfers: Mod assist;+2 physical assistance;+2 safety/equipment       General transfer comment: due to confusion, did not attempt RW; NT assisted with chair to bed  Ambulation/Gait                 Stairs             Wheelchair Mobility    Modified Rankin (Stroke Patients Only)       Balance Overall balance assessment: Needs assistance Sitting-balance support: No upper extremity supported;Feet supported Sitting balance-Leahy Scale: Poor     Standing balance support: Bilateral upper extremity supported Standing balance-Leahy Scale: Poor Standing balance comment: requires bil UE support via RW and min assist                            Cognition Arousal/Alertness: Lethargic Behavior During Therapy: Restless Overall Cognitive Status: Impaired/Different from baseline                                 General Comments: Patient noted to be trying to get out of chair with HFNC O2 off; more confused as attempting to get O2 on and back in chair to prevent fall      Exercises      General Comments General comments (skin integrity, edema, etc.):  Patient on 9L O2 via HFNC on arrival with SpO2 87 at rest.  On sitting up at EOB SpO2 decreased to 79.  Worked on pursed lip breathing.  Increased O2 to 11L. and SpO2 increased to 88.  With stand pivot to chair, SpO2 decreased to 80.  Lingering at 80 after ~3 min sitting rest and pursed lip breathing , patient very short of breath.  Increased O2 to 14L and left patient on this.  After ~5 min resting SpO2 at 87.  RN aware of increase       Pertinent Vitals/Pain Pain Assessment: Faces Faces Pain Scale: Hurts even more Pain Location: general discomfort with shortness of breath Pain Descriptors / Indicators: Discomfort Pain Intervention(s): Limited  activity within patient's tolerance;Monitored during session;Other (comment) (called for RN assist)    Home Living Family/patient expects to be discharged to:: Private residence Living Arrangements: Spouse/significant other Available Help at Discharge: Family;Available 24 hours/day Type of Home: House Home Access: Ramped entrance   Home Layout: Two level Home Equipment: Walker - 2 wheels;Toilet riser;Shower seat;Grab bars - tub/shower;Cane - single point Additional Comments: 2 golf carts (50 acres)    Prior Function Level of Independence: Needs assistance  Gait / Transfers Assistance Needed: uses RW; but sometimes "furniture walks"; reports he has fallen this year but not sure how many times ADL's / Homemaking Assistance Needed: does independently until recent illness     PT Goals (current goals can now be found in the care plan section) Acute Rehab PT Goals Patient Stated Goal: return home Time For Goal Achievement: 06/10/20 Potential to Achieve Goals: Good Progress towards PT goals: Not progressing toward goals - comment (decline in medical status)    Frequency    Min 3X/week      PT Plan Other (comment) (continue to assess)    Co-evaluation              AM-PAC PT "6 Clicks" Mobility   Outcome Measure  Help needed turning from your back to your side while in a flat bed without using bedrails?: A Lot Help needed moving from lying on your back to sitting on the side of a flat bed without using bedrails?: A Lot Help needed moving to and from a bed to a chair (including a wheelchair)?: A Lot Help needed standing up from a chair using your arms (e.g., wheelchair or bedside chair)?: A Lot Help needed to walk in hospital room?: Total   6 Click Score: 9    End of Session Equipment Utilized During Treatment: Oxygen Activity Tolerance: Treatment limited secondary to medical complications (Comment) Patient left: with call bell/phone within reach;in bed;with bed alarm  set;with nursing/sitter in room Nurse Communication: Other (comment) (incr dyspnea; found with O2 off and sats 79%) PT Visit Diagnosis: Muscle weakness (generalized) (M62.81);Difficulty in walking, not elsewhere classified (R26.2)     Time: 8182-9937 PT Time Calculation (min) (ACUTE ONLY): 21 min  Charges:  $Therapeutic Activity: 8-22 mins                      Arby Barrette, PT Pager [phone]    Rexanne Mano 05/28/2020, 12:48 PM

## 2020-05-28 NOTE — Progress Notes (Signed)
RT called to assess patient for desaturation.  Upon entering room, patient on NRB.  Replaced pulse ox. Pulled patient up in bed. Increased flow on NRB to 15L to maximize inflation on NRB.  Patient currently 99%. BBS noted for wheezing. Combivent inhaler given by RN prior to assessment.

## 2020-05-28 NOTE — Progress Notes (Signed)
Patient desalting 82% on [place] 5 lit. Patient was restless and try to get out from the  bed. Notified on call [facility] and called raspatory at bed site. 12 lead EKG done as per MD.patient doesn't tolerate with venturi mask. Replaced NRB to 15L.Given Combivent inhaler as as per schedule. Will continue monitor.

## 2020-05-29 ENCOUNTER — Inpatient Hospital Stay (HOSPITAL_COMMUNITY): Payer: Medicare Other

## 2020-05-29 LAB — MAGNESIUM: Magnesium: 2 mg/dL (ref 1.7–2.4)

## 2020-05-29 LAB — CBC WITH DIFFERENTIAL/PLATELET
Abs Immature Granulocytes: 0.13 10*3/uL — ABNORMAL HIGH (ref 0.00–0.07)
Basophils Absolute: 0 10*3/uL (ref 0.0–0.1)
Basophils Relative: 0 %
Eosinophils Absolute: 0 10*3/uL (ref 0.0–0.5)
Eosinophils Relative: 0 %
HCT: 33.3 % — ABNORMAL LOW (ref 39.0–52.0)
Hemoglobin: 10.7 g/dL — ABNORMAL LOW (ref 13.0–17.0)
Immature Granulocytes: 2 %
Lymphocytes Relative: 10 %
Lymphs Abs: 0.8 10*3/uL (ref 0.7–4.0)
MCH: 29.3 pg (ref 26.0–34.0)
MCHC: 32.1 g/dL (ref 30.0–36.0)
MCV: 91.2 fL (ref 80.0–100.0)
Monocytes Absolute: 0.5 10*3/uL (ref 0.1–1.0)
Monocytes Relative: 6 %
Neutro Abs: 6.6 10*3/uL (ref 1.7–7.7)
Neutrophils Relative %: 82 %
Platelets: 244 10*3/uL (ref 150–400)
RBC: 3.65 MIL/uL — ABNORMAL LOW (ref 4.22–5.81)
RDW: 15.2 % (ref 11.5–15.5)
WBC: 8.1 10*3/uL (ref 4.0–10.5)
nRBC: 0 % (ref 0.0–0.2)

## 2020-05-29 LAB — COMPREHENSIVE METABOLIC PANEL
ALT: 30 U/L (ref 0–44)
AST: 62 U/L — ABNORMAL HIGH (ref 15–41)
Albumin: 2.7 g/dL — ABNORMAL LOW (ref 3.5–5.0)
Alkaline Phosphatase: 74 U/L (ref 38–126)
Anion gap: 10 (ref 5–15)
BUN: 20 mg/dL (ref 8–23)
CO2: 26 mmol/L (ref 22–32)
Calcium: 8.2 mg/dL — ABNORMAL LOW (ref 8.9–10.3)
Chloride: 101 mmol/L (ref 98–111)
Creatinine, Ser: 0.98 mg/dL (ref 0.61–1.24)
GFR calc Af Amer: >60 mL/min (ref >60)
GFR calc non Af Amer: >60 mL/min (ref >60)
Glucose, Bld: 133 mg/dL — ABNORMAL HIGH (ref 70–99)
Potassium: 3.2 mmol/L — ABNORMAL LOW (ref 3.5–5.1)
Sodium: 137 mmol/L (ref 135–145)
Total Bilirubin: 1 mg/dL (ref 0.3–1.2)
Total Protein: 7 g/dL (ref 6.5–8.1)

## 2020-05-29 LAB — C-REACTIVE PROTEIN: CRP: 11.5 mg/dL — ABNORMAL HIGH (ref <1.0)

## 2020-05-29 LAB — D-DIMER, QUANTITATIVE: D-Dimer, Quant: 2.81 ug/mL-FEU — ABNORMAL HIGH (ref 0.00–0.50)

## 2020-05-29 LAB — PROCALCITONIN: Procalcitonin: <0.1 ng/mL

## 2020-05-29 LAB — BRAIN NATRIURETIC PEPTIDE: B Natriuretic Peptide: 283 pg/mL — ABNORMAL HIGH (ref 0.0–100.0)

## 2020-05-29 MED ORDER — LORAZEPAM 2 MG/ML IJ SOLN
0.5000 mg | Freq: Once | INTRAMUSCULAR | Status: AC
  Administered 2020-05-29: 0.5 mg via INTRAVENOUS
  Filled 2020-05-29: qty 1

## 2020-05-29 MED ORDER — POTASSIUM CHLORIDE 20 MEQ PO PACK: 40.0000 meq | PACK | Freq: Once | ORAL | Status: DC

## 2020-05-29 MED ORDER — DEXAMETHASONE SODIUM PHOSPHATE 10 MG/ML IJ SOLN
20.0000 mg | INTRAMUSCULAR | Status: DC
  Administered 2020-05-30 – 2020-06-02 (×4): 20 mg via INTRAVENOUS
  Filled 2020-05-29 (×4): qty 2

## 2020-05-29 MED ORDER — POTASSIUM CHLORIDE 10 MEQ/100ML IV SOLN
10.0000 meq | INTRAVENOUS | Status: AC
  Administered 2020-05-29 (×4): 10 meq via INTRAVENOUS
  Filled 2020-05-29 (×3): qty 100

## 2020-05-29 MED ORDER — FUROSEMIDE 10 MG/ML IJ SOLN
60.0000 mg | Freq: Once | INTRAMUSCULAR | Status: AC
  Administered 2020-05-29: 60 mg via INTRAVENOUS
  Filled 2020-05-29: qty 6

## 2020-05-29 MED ORDER — DEXAMETHASONE SODIUM PHOSPHATE 10 MG/ML IJ SOLN
10.0000 mg | Freq: Once | INTRAMUSCULAR | Status: AC
  Administered 2020-05-29: 10 mg via INTRAVENOUS
  Filled 2020-05-29: qty 1

## 2020-05-29 NOTE — Progress Notes (Signed)
PROGRESS NOTE                                                                                                                                                                                                             Patient Demographics:    Craig Chapman, is a 86 y.o. male, DOB - Mar 03, 1928, MBT:[id number]  Outpatient Primary MD for the patient is Lavone Orn, MD    LOS - 3  Admit date - 05/25/2020    Chief Complaint  Patient presents with  . Altered Mental Status       Brief Narrative  Craig Chapman is a 86 y.o. male with medical history significant of HLD, paroxysmal A. Fib, remote history of lymphoma, remote history of lung cancer status post left pneumonectomy, presented with confusion and short of breath, he was diagnosed with acute hypoxic respiratory failure due to COVID-19 pneumonia and admitted to the hospital.  Unfortunately patient was not able to get the vaccine so far.   Subjective:   Seen in bed appears more confused and lethargic, slightly short of breath, unable to answer questions or follow commands reliably.   Assessment  & Plan :     1. Acute Hypoxic Resp. Failure due to Acute Covid 19 Viral Pneumonitis during the ongoing 2020 Covid 19 Pandemic -he had moderate disease but quickly is progressing towards extremely severe parenchymal lung injury, on high-dose IV steroids and remdesivir along with as needed Lasix, currently on 15 L nasal cannula oxygen and will be placed on heated high flow.  Poor candidate for Actemra due to advanced age and frail condition, rapidly declining, family has been informed that if worse and suffering we will focus on keeping him comfortable.  He is DNR.  Prognosis again appears poor.  Encouraged the patient to sit up in chair in the daytime when possible and use I-S and flutter valve for pulmonary toiletry and then prone in bed when at night.  Will advance activity and  titrate down oxygen as possible.   SpO2: 91 % O2 Flow Rate (L/min): 15 L/min FiO2 (%): 100 %  Recent Labs  Lab 05/31/2020 1149 06/03/2020 1200 05/27/20 0130 05/28/20 0321 05/29/20 0336  CRP  --   --  6.9* 6.7* 11.5*  DDIMER  --   --  3.02* 2.82* 2.81*  BNP  --  361.5* 530.8* 236.1* 283.0*  PROCALCITON  --   --  <0.10 <0.10 <0.10  SARSCOV2NAA POSITIVE*  --   --   --   --     Hepatic Function Latest Ref Rng & Units 05/29/2020 05/28/2020 05/27/2020  Total Protein 6.5 - 8.1 g/dL 7.0 6.9 6.9  Albumin 3.5 - 5.0 g/dL 2.7(L) 2.6(L) 2.4(L)  AST 15 - 41 U/L 62(H) 59(H) 55(H)  ALT 0 - 44 U/L _0 Alk Phosphatase 38 - 126 U/L 74 69 67  Total Bilirubin 0.3 - 1.2 mg/dL 1.0 0.7 0.6    2.  History of lung cancer with questionable right hilar mass.  History of  left-sided lung lobectomy, outpatient follow-up with PCP and pulmonologist post discharge.  Does not appear to be a candidate for invasive testing or procedures.  3.  Anemia with evidence of some iron deficiency.  Placed on oral iron, continue PPI, outpatient age-appropriate anemia work-up per DCP.  Currently not a candidate for EGD or colonoscopy.  4.  Dyslipidemia on statin.  5.  Hypertension.  As needed hydralazine and monitor.  6.  Toxic encephalopathy.  Due to Covid infection.  Haldol, minimize narcotics and benzodiazepines.  Supportive care.  Family has already been warned that this might happen on 05/27/2020 and reaffirmed on 05/28/2020.    Condition - Extremely Guarded  Family Communication  :  Dougles Kimmey [phone] on 05/27/2020, 05/28/20, 05/29/20  Code Status : DNR  Consults  : None  Procedures  : None  PUD Prophylaxis : PPI  Disposition Plan  :    Status is: Inpatient  Remains inpatient appropriate because:IV treatments appropriate due to intensity of illness or inability to take PO   Dispo: The patient is from: Home              Anticipated d/c is to: Home              Anticipated d/c date is: > 3 days               Patient currently is not medically stable to d/c.   DVT Prophylaxis  :  Lovenox    Lab Results  Component Value Date   PLT 244 05/29/2020    Diet :  Diet Order            DIET SOFT Room service appropriate? Yes; Fluid consistency: Thin  Diet effective now                  Inpatient Medications  Scheduled Meds: . aspirin EC  81 mg Oral Daily  . atorvastatin  40 mg Oral q1800  . dexamethasone (DECADRON) injection  10 mg Intravenous Once  . [START ON 05/30/2020] dexamethasone (DECADRON) injection  20 mg Intravenous Q24H  . docusate sodium  200 mg Oral BID  . enoxaparin (LOVENOX) injection  60 mg Subcutaneous Q24H  . ferrous sulfate  325 mg Oral BID WC  . furosemide  60 mg Intravenous Once  . Ipratropium-Albuterol  1 puff Inhalation Q6H  . mouth rinse  15 mL Mouth Rinse BID  . metoprolol tartrate  50 mg Oral BID  . pantoprazole  40 mg Oral Daily  . potassium chloride  40 mEq Oral Once   Continuous Infusions: . potassium chloride    . remdesivir 100 mg in NS 100 mL 100 mg (05/29/20 0946)   PRN Meds:.acetaminophen, guaiFENesin-dextromethorphan, haloperidol lactate, hydrALAZINE, senna-docusate  Antibiotics  :    Anti-infectives (From admission, onward)  Start     Dose/Rate Route Frequency Ordered Stop   05/27/20 1000  remdesivir 100 mg in sodium chloride 0.9 % 100 mL IVPB     Discontinue    "Followed by" Linked Group Details   100 mg 200 mL/hr over 30 Minutes Intravenous Daily 05/31/2020 1348 05/31/20 0959   05/28/2020 1400  remdesivir 200 mg in sodium chloride 0.9% 250 mL IVPB       "Followed by" Linked Group Details   200 mg 580 mL/hr over 30 Minutes Intravenous Once 05/27/2020 1348 05/25/2020 1715       Time Spent in minutes  30   Lala Lund M.D on 05/29/2020 at 9:52 AM  To page go to [email or web address] - password [facility]  [facility] -  Office  [phone]  See all Orders from today for further details    Objective:   Vitals:   05/29/20 0600  05/29/20 0652 05/29/20 0800 05/29/20 0937  BP: 108/63 (!) 144/83  98/80  Pulse: 80 95    Resp: 20 15    Temp: 97.7 F (36.5 C) 97.8 F (36.6 C) 99.2 F (37.3 C) 99.3 F (37.4 C)  TempSrc: Axillary Axillary Axillary Axillary  SpO2: 94% 91%    Weight:      Height:        Wt Readings from Last 3 Encounters:  05/29/20 96 kg  01/17/20 103.8 kg  09/19/19 101.2 kg     Intake/Output Summary (Last 24 hours) at 05/29/2020 0952 Last data filed at 05/29/2020 0818 Gross per 24 hour  Intake 200 ml  Output 2250 ml  Net -2050 ml     Physical Exam  Awake but more confused, moving all 4 extremities by himself, .AT,PERRAL Supple Neck,No JVD, No cervical lymphadenopathy appriciated.  Symmetrical Chest wall movement, Good air movement bilaterally but has coarse breath sounds bilaterally, RRR,No Gallops, Rubs or new Murmurs, No Parasternal Heave +ve B.Sounds, Abd Soft, No tenderness, No organomegaly appriciated, No rebound - guarding or rigidity. No Cyanosis, Clubbing or edema      Data Review:    CBC Recent Labs  Lab 06/05/2020 1200 05/28/2020 1332 05/27/20 0130 05/28/20 0321 05/29/20 0336  WBC 3.0*  --  1.8* 5.0 8.1  HGB 8.6* 11.9* 9.7* 10.4* 10.7*  HCT 27.5* 35.0* 30.7* 33.7* 33.3*  PLT 148*  --  173 201 244  MCV 92.6  --  92.7 92.6 91.2  MCH 29.0  --  29.3 28.6 29.3  MCHC 31.3  --  31.6 30.9 32.1  RDW 15.3  --  15.1 15.3 15.2  LYMPHSABS 0.6*  --  0.6* 0.9 0.8  MONOABS 0.3  --  0.1 0.4 0.5  EOSABS 0.0  --  0.0 0.0 0.0  BASOSABS 0.0  --  0.0 0.0 0.0    Chemistries  Recent Labs  Lab 05/29/2020 1200 05/10/2020 1332 05/27/20 0130 05/28/20 0321 05/29/20 0336  NA 136 139 138 137 137  K 3.3* 3.5 4.1 3.9 3.2*  CL 105  --  105 103 101  CO2 22  --  _0 GLUCOSE 116*  --  143* 121* 133*  BUN 18  --  _1 CREATININE 0.93  --  0.79 0.80 0.98  CALCIUM 7.5*  --  8.1* 8.5* 8.2*  AST 55*  --  55* 59* 62*  ALT 25  --  _2 ALKPHOS 63  --  67 69 74  BILITOT 0.8   --  0.6 0.7 1.0  MG  --   --  2.1 1.9 2.0  INR 1.5*  --   --   --   --      ------------------------------------------------------------------------------------------------------------------ No results for input(s): CHOL, HDL, LDLCALC, TRIG, CHOLHDL, LDLDIRECT in the last 72 hours.  Lab Results  Component Value Date   HGBA1C 5.4 08/16/2019   ------------------------------------------------------------------------------------------------------------------ No results for input(s): TSH, T4TOTAL, T3FREE, THYROIDAB in the last 72 hours.  Invalid input(s): FREET3  Cardiac Enzymes No results for input(s): CKMB, TROPONINI, MYOGLOBIN in the last 168 hours.  Invalid input(s): CK ------------------------------------------------------------------------------------------------------------------    Component Value Date/Time   BNP 283.0 (H) 05/29/2020 7121    Micro Results Recent Results (from the past 240 hour(s))  Blood culture (routine x 2)     Status: None (Preliminary result)   Collection Time: 05/17/2020 11:41 AM   Specimen: BLOOD RIGHT FOREARM  Result Value Ref Range Status   Specimen Description BLOOD RIGHT FOREARM  Final   Special Requests   Final    BOTTLES DRAWN AEROBIC AND ANAEROBIC Blood Culture adequate volume   Culture   Final    NO GROWTH 2 DAYS Performed at [facility], [address] [place], [place] [address]    Report Status PENDING  Incomplete  Blood culture (routine x 2)     Status: None (Preliminary result)   Collection Time: 05/09/2020 11:46 AM   Specimen: BLOOD RIGHT HAND  Result Value Ref Range Status   Specimen Description BLOOD RIGHT HAND  Final   Special Requests   Final    BOTTLES DRAWN AEROBIC AND ANAEROBIC Blood Culture results may not be optimal due to an inadequate volume of blood received in culture bottles   Culture   Final    NO GROWTH 2 DAYS Performed at [facility], [place] [address], [place], [place] [address]    Report Status  PENDING  Incomplete  SARS Coronavirus 2 by RT PCR (hospital order, performed in [facility] hospital lab) Nasopharyngeal Nasopharyngeal Swab     Status: Abnormal   Collection Time: 05/09/2020 11:49 AM   Specimen: Nasopharyngeal Swab  Result Value Ref Range Status   SARS Coronavirus 2 POSITIVE (A) NEGATIVE Final    Comment: RESULT CALLED TO, READ BACK BY AND VERIFIED WITH: RN S PLUNKETT AT 1339 05/10/2020 BY L BENFIELD (NOTE) SARS-CoV-2 target nucleic acids are DETECTED  SARS-CoV-2 RNA is generally detectable in upper respiratory specimens  during the acute phase of infection.  Positive results are indicative  of the presence of the identified virus, but do not rule out bacterial infection or co-infection with other pathogens not detected by the test.  Clinical correlation with patient history and  other diagnostic information is necessary to determine patient infection status.  The expected result is negative.  Fact Sheet for Patients:   StrictlyIdeas.no   Fact Sheet for Healthcare Providers:   BankingDealers.co.za    This test is not yet approved or cleared by the [place] FDA and  has been authorized for detection and/or diagnosis of SARS-CoV-2 by FDA under an Emergency Use Authorization (EUA).  This EUA will remain in effect (meanin g this test can be used) for the duration of  the COVID-19 declaration under Section 564(b)(1) of the Act, 21 U.S.C. section 360-bbb-3(b)(1), unless the authorization is terminated or revoked sooner.  Performed at [facility], [place] [address], [place], [place] [address]     Radiology Reports DG Chest 1 View  Result Date: 05/22/2020 CLINICAL  DATA:  Altered mental status, recent UTI, history of lung cancer, lymphoma, hypertension, atrial fibrillation EXAM: CHEST  1 VIEW COMPARISON:  08/21/2019 FINDINGS: Post LEFT pneumonectomy with persistent opacification of LEFT hemithorax. Atherosclerotic  calcification aorta. Perihilar infiltrates identified in RIGHT upper lobe consistent with pneumonia. Enlargement of RIGHT pulmonary hilum, could reflect prominent vascular structure, adenopathy, mass, or artifact from slight rotation to the LEFT. No pneumothorax. Bones demineralized with chronic RIGHT rotator cuff tear. IMPRESSION: Postsurgical changes LEFT pneumonectomy. New RIGHT upper lobe infiltrate consistent with pneumonia. Question RIGHT hilar mass/adenopathy versus artifact from rotation. Electronically Signed   By: Lavonia Dana M.D.   On: 05/10/2020 12:49   CT Head Wo Contrast  Result Date: 05/22/2020 CLINICAL DATA:  Altered mental status EXAM: CT HEAD WITHOUT CONTRAST TECHNIQUE: Contiguous axial images were obtained from the base of the skull through the vertex without intravenous contrast. COMPARISON:  08/16/2019 FINDINGS: Brain: Atrophic changes and scattered lacunar infarcts and chronic white matter ischemic change are seen and relatively stable from the prior exam. No findings to suggest acute hemorrhage, acute infarction or space-occupying mass lesion are noted. Vascular: No hyperdense vessel or unexpected calcification. Skull: Normal. Negative for fracture or focal lesion. Sinuses/Orbits: No acute finding. Other: None. IMPRESSION: Atrophic and ischemic changes without acute abnormality. Electronically Signed   By: Inez Catalina M.D.   On: 05/16/2020 12:52   DG Chest Port 1 View  Result Date: 05/29/2020 CLINICAL DATA:  Shortness of breath.  COVID 19 positive. EXAM: PORTABLE CHEST 1 VIEW COMPARISON:  05/28/2020 FINDINGS: Sequelae of left pneumonectomy are again identified with unchanged opacification of the left hemithorax. Right hilar prominence is unchanged. There is persistent airspace opacity throughout the right upper lobe without significant interval change. Mild right basilar opacity has decreased. No sizable right-sided pleural effusion or pneumothorax is identified. IMPRESSION: 1.  Unchanged right upper lobe pneumonia. 2. Improved right basilar aeration. Electronically Signed   By: Logan Bores M.D.   On: 05/29/2020 08:17   DG Chest Port 1 View  Result Date: 05/28/2020 CLINICAL DATA:  Shortness of breath, COVID-19 positive. EXAM: PORTABLE CHEST 1 VIEW COMPARISON:  May 27, 2020. FINDINGS: Status post left pneumonectomy. No pneumothorax is noted stable right upper lobe airspace opacity is noted consistent with pneumonia. Mildly increased right basilar opacity is noted concerning for worsening pneumonia. Bony thorax is unremarkable. IMPRESSION: Stable right upper lobe airspace opacity is noted consistent with pneumonia. Mildly increased right basilar opacity is noted concerning for worsening pneumonia. Electronically Signed   By: Marijo Conception M.D.   On: 05/28/2020 09:03   DG Chest Port 1 View  Result Date: 05/27/2020 CLINICAL DATA:  Shortness of breath, pneumonia, COVID-19 EXAM: PORTABLE CHEST 1 VIEW COMPARISON:  Portable exam 0649 hours compared to 05/18/2020 FINDINGS: Persistent complete opacification of the LEFT hemithorax postpneumonectomy. Increased RIGHT upper lobe infiltrate consistent with pneumonia. Prominence of RIGHT hilum unchanged. Minimal atelectasis at RIGHT base. No pneumothorax. Bones demineralized. IMPRESSION: Increased RIGHT upper lobe infiltrate consistent with pneumonia. Post LEFT pneumonectomy. Electronically Signed   By: Lavonia Dana M.D.   On: 05/27/2020 09:14   VAS [place] LOWER EXTREMITY VENOUS (DVT)  Result Date: 05/27/2020  Lower Venous DVTStudy Indications: Edema, Swelling, and Rapidly rising D-dimer.  Comparison Study: no prior Performing Technologist: Abram Sander RVS  Examination Guidelines: A complete evaluation includes B-mode imaging, spectral Doppler, color Doppler, and power Doppler as needed of all accessible portions of each vessel. Bilateral testing is considered an integral part of a complete examination.  Limited examinations for reoccurring  indications may be performed as noted. The reflux portion of the exam is performed with the patient in reverse Trendelenburg.  +---------+---------------+---------+-----------+----------+--------------+ RIGHT    CompressibilityPhasicitySpontaneityPropertiesThrombus Aging +---------+---------------+---------+-----------+----------+--------------+ CFV      Full           Yes      Yes                                 +---------+---------------+---------+-----------+----------+--------------+ SFJ      Full                                                        +---------+---------------+---------+-----------+----------+--------------+ FV Prox  Full                                                        +---------+---------------+---------+-----------+----------+--------------+ FV Mid   Full                                                        +---------+---------------+---------+-----------+----------+--------------+ FV DistalFull                                                        +---------+---------------+---------+-----------+----------+--------------+ PFV      Full                                                        +---------+---------------+---------+-----------+----------+--------------+ POP      Full           Yes      Yes                                 +---------+---------------+---------+-----------+----------+--------------+ PTV      Full                                                        +---------+---------------+---------+-----------+----------+--------------+ PERO     Full                                                        +---------+---------------+---------+-----------+----------+--------------+   +---------+---------------+---------+-----------+----------+--------------+ LEFT     CompressibilityPhasicitySpontaneityPropertiesThrombus Aging  +---------+---------------+---------+-----------+----------+--------------+ CFV      Full  Yes      Yes                                 +---------+---------------+---------+-----------+----------+--------------+ SFJ      Full                                                        +---------+---------------+---------+-----------+----------+--------------+ FV Prox  Full                                                        +---------+---------------+---------+-----------+----------+--------------+ FV Mid   Full                                                        +---------+---------------+---------+-----------+----------+--------------+ FV DistalFull                                                        +---------+---------------+---------+-----------+----------+--------------+ PFV      Full                                                        +---------+---------------+---------+-----------+----------+--------------+ POP      Full           Yes      Yes                                 +---------+---------------+---------+-----------+----------+--------------+ PTV      Full                                                        +---------+---------------+---------+-----------+----------+--------------+ PERO                                                  Not visualized +---------+---------------+---------+-----------+----------+--------------+     Summary: BILATERAL: - No evidence of deep vein thrombosis seen in the lower extremities, bilaterally. - No evidence of superficial venous thrombosis in the lower extremities, bilaterally. -   *See table(s) above for measurements and observations. Electronically signed by Servando Snare MD on 05/27/2020 at 5:30:45 PM.    Final

## 2020-05-29 NOTE — Progress Notes (Signed)
This note also relates to the following rows which could not be included: BP - Cannot attach notes to unvalidated device data Pulse Rate - Cannot attach notes to unvalidated device data ECG Heart Rate - Cannot attach notes to unvalidated device data Resp - Cannot attach notes to unvalidated device data SpO2 - Cannot attach notes to unvalidated device data    05/29/20 0800  Assess: MEWS Score  Temp 99.2 F (37.3 C)  Level of Consciousness Responds to Pain  O2 Device Nasal Cannula  Patient Activity (if Appropriate) In bed  Assess: MEWS Score  MEWS Temp 0  MEWS Systolic 0  MEWS Pulse 0  MEWS RR 0  MEWS LOC 2  MEWS Score 2  MEWS Score Color Yellow  Assess: if the MEWS score is Yellow or Red  Were vital signs taken at a resting state? Yes  Focused Assessment Change from prior assessment (see assessment flowsheet)  Early Detection of Sepsis Score *See Row Information* Low  MEWS guidelines implemented *See Row Information* Yes  Treat  MEWS Interventions Administered scheduled meds/treatments  Pain Scale Faces  Pain Score 0  Faces Pain Scale 0  Notify: Charge Nurse/RN  Name of Charge Nurse/RN Notified Sarah, RN  Date Charge Nurse/RN Notified 05/29/20  Time Charge Nurse/RN Notified [phone]  Notify: Provider  Provider Name/Title P. Candiss Norse  Date Provider Notified 05/29/20  Time Provider Notified [phone]  Notification Type  (chat)  Notification Reason Change in status

## 2020-05-29 NOTE — Progress Notes (Addendum)
   05/29/20 0937  Vitals  Temp 99.3 F (37.4 C)  Temp Source Axillary  BP 98/80  BP Location Right Arm  BP Method Automatic  Patient Position (if appropriate) Lying  Pulse Rate Source Monitor  Level of Consciousness  Level of Consciousness Responds to Pain  MEWS COLOR  MEWS Score Color Yellow  Oxygen Therapy  O2 Device Nasal Cannula;HFNC  Heater temperature 59 F (15 C)  O2 Flow Rate (L/min) 15 L/min  Pain Assessment  Pain Scale Faces  Pain Score 0  Faces Pain Scale 0  MEWS Score  MEWS Temp 0  MEWS Systolic 1  MEWS Pulse 0  MEWS RR 0  MEWS LOC 2  MEWS Score 3  Provider Notification  Provider Name/Title P Sngh  Date Provider Notified 05/29/20  Time Provider Notified [phone]  Notification Type  (Chat)  Notification Reason Other (Comment)  Response See new orders  Date of Provider Response 05/29/20  Time of Provider Response [phone]  Notified provider and charge. Continuing to monitor patient.

## 2020-05-29 NOTE — Progress Notes (Signed)
Pt had a yellow MEWs occur dt resp rate. Rechecked resp rate and it was improved, see documentation. MD paged dt pt having increased work of breathing, ativan IV ordered and a chest CT pending. Gave handoff to Beverlee Nims, RN and explained recent events and POC.

## 2020-05-29 NOTE — Progress Notes (Signed)
SLP Cancellation Note  Patient Details Name: PAGE LANCON MRN: [id number] DOB: 1928/08/10   Cancelled treatment:       Reason Eval/Treat Not Completed: Other (comment). Pt declining per RN. No need for swallow eval at this time. Will d/c order.    Kedron Uno, Katherene Ponto 05/29/2020, 12:51 PM

## 2020-05-29 NOTE — Progress Notes (Signed)
Physical Therapy Discharge Patient Details Name: AIYDEN LAUDERBACK MRN: [id number] DOB: [birth year] Today's Date: 05/29/2020 Time:  -     Patient discharged from PT services secondary to patient has had medical decline and is only responding to painful stimuli. Family visits preparing for end of life have been initiated. .  Please see latest therapy progress note for current level of functioning and progress toward goals.    Progress and discharge plan not discussed with patient and/or caregiver: not discussed due to end of life   GP      Arby Barrette, PT Pager [phone]  Rexanne Mano 05/29/2020, 5:55 PM

## 2020-05-29 NOTE — Progress Notes (Signed)
Spoke to patient grandson this morning who requested to come see patient and per dr Candiss Norse if it's ok with nursing then no problem to have him come up and see patient.  Explained that he will need to wear all PPE required.  He fully understood all directions.  Advised to have desk call our desk and then send him up to our floor.

## 2020-05-29 NOTE — Progress Notes (Signed)
Patient a yellow MEWS due to pain only responding to painful stimuli. Patient has low grade fever of 99.2 axillary.  Notified all required.  Will continue to monitor patient.

## 2020-05-29 NOTE — Progress Notes (Signed)
Patient family in room with patient at this time. Patient is not responding other than to painful stimuli.  Patient vitals repeating according to MEWS. Continuing to monitor patient.

## 2020-05-30 ENCOUNTER — Inpatient Hospital Stay (HOSPITAL_COMMUNITY): Payer: Medicare Other

## 2020-05-30 MED ORDER — IOHEXOL 300 MG/ML  SOLN
75.0000 mL | Freq: Once | INTRAMUSCULAR | Status: AC | PRN
  Administered 2020-05-30: 75 mL via INTRAVENOUS

## 2020-05-30 MED ORDER — FUROSEMIDE 10 MG/ML IJ SOLN
60.0000 mg | Freq: Once | INTRAMUSCULAR | Status: AC
  Administered 2020-05-30: 60 mg via INTRAVENOUS
  Filled 2020-05-30: qty 6

## 2020-05-30 MED ORDER — POTASSIUM CHLORIDE 10 MEQ/100ML IV SOLN
10.0000 meq | INTRAVENOUS | Status: AC
  Administered 2020-05-30 (×6): 10 meq via INTRAVENOUS
  Filled 2020-05-30 (×6): qty 100

## 2020-05-30 NOTE — Progress Notes (Signed)
Patient grandson is in room with patient. Patient is sitting upright drinking a cup of water without any problems.  Patient is remaining alert and oriented having full sentences and conversations

## 2020-05-30 NOTE — Progress Notes (Signed)
OT Cancellation Note  Patient Details Name: Craig Chapman MRN: [id number] DOB: 1928/03/26   Cancelled Treatment:    Reason Eval/Treat Not Completed: Other (comment) (Changed to comfort care).  Patient has declined and been changed to comfort care, had family visit this morning.    Will d/c OT orders for comfort.  August Luz, OTR/L   Phylliss Bob 05/30/2020, 12:32 PM

## 2020-05-30 NOTE — Progress Notes (Signed)
Nutrition Brief Note  Chart reviewed. Pt now transitioning to comfort care.  No further nutrition interventions warranted at this time.  Please re-consult as needed.   Pebble Botkin W, RD, LDN, CDCES Registered Dietitian II Certified Diabetes Care and Education Specialist Please refer to AMION for RD and/or RD on-call/weekend/after hours pager  

## 2020-05-30 NOTE — Progress Notes (Signed)
PROGRESS NOTE                                                                                                                                                                                                             Patient Demographics:    Craig Chapman, is a 86 y.o. male, DOB - 1928-10-08, RWE:[id number]  Outpatient Primary MD for the patient is Lavone Orn, MD    LOS - 4  Admit date - 06/03/2020    Chief Complaint  Patient presents with  . Altered Mental Status       Brief Narrative  Craig Chapman is a 86 y.o. male with medical history significant of HLD, paroxysmal A. Fib, remote history of lymphoma, remote history of lung cancer status post left pneumonectomy, presented with confusion and short of breath, he was diagnosed with acute hypoxic respiratory failure due to COVID-19 pneumonia and admitted to the hospital.  Unfortunately patient was not able to get the vaccine so far.   Subjective:   Seen in bed appears more confused and lethargic, slightly short of breath, unable to answer questions or follow commands reliably.   Assessment  & Plan :     1. Acute Hypoxic Resp. Failure due to Acute Covid 19 Viral Pneumonitis during the ongoing 2020 Covid 19 Pandemic -he had moderate disease but quickly is progressing towards extremely severe parenchymal lung injury, on high-dose IV steroids and remdesivir along with as needed Lasix repeated IV 05/29/20, 05/30/20, currently on  25 L nasal cannula oxygen and will be placed on heated high flow.   Poor candidate for Actemra due to advanced age and frail condition, rapidly declining, family has been informed that if worse and suffering we will focus on keeping him comfortable.  He is DNR.  Prognosis again appears poor.  Encouraged the patient to sit up in chair in the daytime when possible and use I-S and flutter valve for pulmonary toiletry and then prone in bed when at  night.  Will advance activity and titrate down oxygen as possible.   SpO2: 93 % O2 Flow Rate (L/min): 25 L/min FiO2 (%): 100 %  Recent Labs  Lab 05/11/2020 1149 05/25/2020 1200 05/27/20 0130 05/28/20 0321 05/29/20 0336  CRP  --   --  6.9* 6.7* 11.5*  DDIMER  --   --  3.02* 2.82*  2.81*  BNP  --  361.5* 530.8* 236.1* 283.0*  PROCALCITON  --   --  <0.10 <0.10 <0.10  SARSCOV2NAA POSITIVE*  --   --   --   --     Hepatic Function Latest Ref Rng & Units 05/29/2020 05/28/2020 05/27/2020  Total Protein 6.5 - 8.1 g/dL 7.0 6.9 6.9  Albumin 3.5 - 5.0 g/dL 2.7(L) 2.6(L) 2.4(L)  AST 15 - 41 U/L 62(H) 59(H) 55(H)  ALT 0 - 44 U/L '30 27 25  '$ Alk Phosphatase 38 - 126 U/L 74 69 67  Total Bilirubin 0.3 - 1.2 mg/dL 1.0 0.7 0.6    2.  History of lung cancer with questionable right hilar mass.  History of  left-sided lung lobectomy, outpatient follow-up with PCP and pulmonologist post discharge.  Does not appear to be a candidate for invasive testing or procedures.  3.  Anemia with evidence of some iron deficiency.  Placed on oral iron, continue PPI, outpatient age-appropriate anemia work-up per DCP.  Currently not a candidate for EGD or colonoscopy.  4.  Dyslipidemia on statin.  5.  Hypertension.  As needed hydralazine and monitor.  6.  Toxic encephalopathy.  Due to Covid infection.  Haldol, minimize narcotics and benzodiazepines.  Supportive care.  Family has already been warned that this might happen on 05/27/2020 and reaffirmed on 05/28/2020.  7. Hypokalemia - replace.    Condition - Extremely Guarded  Family Communication  :  Pankaj Haack [phone] on 05/27/2020, 05/28/20, 05/29/20, 05/30/20  Code Status : DNR  Consults  : None  Procedures  :    CTA - 1. Diffuse right upper lobe ground-glass airspace disease, consistent with pneumonia. 2. Left pneumonectomy. 3. 4 cm ascending thoracic aortic aneurysm. Recommend annual imaging followup by CTA or MRA. This recommendation follows 2010  ACCF/AHA/AATS/ACR/ASA/SCA/SCAI/SIR/STS/SVM Guidelines for the Diagnosis and Management of Patients with Thoracic Aortic Disease. Circulation. 2010; 121: K957-M734. Aortic aneurysm NOS (ICD10-I71.9) 4.  Aortic Atherosclerosis (ICD10-I70.0)  Leg [place] - No DVT   PUD Prophylaxis : PPI  Disposition Plan  :    Status is: Inpatient  Remains inpatient appropriate because:IV treatments appropriate due to intensity of illness or inability to take PO   Dispo: The patient is from: Home              Anticipated d/c is to: Home              Anticipated d/c date is: > 3 days              Patient currently is not medically stable to d/c.   DVT Prophylaxis  :  Lovenox    Lab Results  Component Value Date   PLT 244 05/29/2020    Diet :  Diet Order            DIET SOFT Room service appropriate? Yes; Fluid consistency: Thin  Diet effective now                  Inpatient Medications  Scheduled Meds: . aspirin EC  81 mg Oral Daily  . atorvastatin  40 mg Oral q1800  . dexamethasone (DECADRON) injection  20 mg Intravenous Q24H  . docusate sodium  200 mg Oral BID  . enoxaparin (LOVENOX) injection  60 mg Subcutaneous Q24H  . ferrous sulfate  325 mg Oral BID WC  . Ipratropium-Albuterol  1 puff Inhalation Q6H  . mouth rinse  15 mL Mouth Rinse BID  . metoprolol tartrate  50 mg Oral  BID  . pantoprazole  40 mg Oral Daily   Continuous Infusions: . potassium chloride 10 mEq (05/30/20 1039)   PRN Meds:.acetaminophen, guaiFENesin-dextromethorphan, haloperidol lactate, hydrALAZINE, senna-docusate  Antibiotics  :    Anti-infectives (From admission, onward)   Start     Dose/Rate Route Frequency Ordered Stop   05/27/20 1000  remdesivir 100 mg in sodium chloride 0.9 % 100 mL IVPB       "Followed by" Linked Group Details   100 mg 200 mL/hr over 30 Minutes Intravenous Daily 06/06/2020 1348 05/30/20 0954   05/09/2020 1400  remdesivir 200 mg in sodium chloride 0.9% 250 mL IVPB       "Followed by"  Linked Group Details   200 mg 580 mL/hr over 30 Minutes Intravenous Once 05/22/2020 1348 06/06/2020 1715       Time Spent in minutes  30   Lala Lund M.D on 05/30/2020 at 11:14 AM  To page go to [email or web address] - password [facility]  [facility] -  Office  [phone]  See all Orders from today for further details    Objective:   Vitals:   05/30/20 0000 05/30/20 0400 05/30/20 0415 05/30/20 0823  BP: 113/65 (!) 146/75    Pulse: 57 94  69  Resp: '16 19  18  '$ Temp: 97.8 F (36.6 C) 97.7 F (36.5 C)    TempSrc: Axillary Axillary    SpO2: 99% 91% 90% 93%  Weight:      Height:        Wt Readings from Last 3 Encounters:  05/29/20 96 kg  01/17/20 103.8 kg  09/19/19 101.2 kg     Intake/Output Summary (Last 24 hours) at 05/30/2020 1114 Last data filed at 05/30/2020 8546 Gross per 24 hour  Intake 397.19 ml  Output 1200 ml  Net -802.81 ml     Physical Exam  Awake less confused, No new F.N deficits  [place].AT,PERRAL Supple Neck,No JVD, No cervical lymphadenopathy appriciated.  Symmetrical Chest wall movement, Good air movement bilaterally, few rales RRR,No Gallops, Rubs or new Murmurs, No Parasternal Heave +ve B.Sounds, Abd Soft, No tenderness, No organomegaly appriciated, No rebound - guarding or rigidity. No Cyanosis, Clubbing or edema, No new Rash or bruise    Data Review:    CBC Recent Labs  Lab 05/25/2020 1200 05/09/2020 1332 05/27/20 0130 05/28/20 0321 05/29/20 0336  WBC 3.0*  --  1.8* 5.0 8.1  HGB 8.6* 11.9* 9.7* 10.4* 10.7*  HCT 27.5* 35.0* 30.7* 33.7* 33.3*  PLT 148*  --  173 201 244  MCV 92.6  --  92.7 92.6 91.2  MCH 29.0  --  29.3 28.6 29.3  MCHC 31.3  --  31.6 30.9 32.1  RDW 15.3  --  15.1 15.3 15.2  LYMPHSABS 0.6*  --  0.6* 0.9 0.8  MONOABS 0.3  --  0.1 0.4 0.5  EOSABS 0.0  --  0.0 0.0 0.0  BASOSABS 0.0  --  0.0 0.0 0.0    Chemistries  Recent Labs  Lab 06/06/2020 1200 05/18/2020 1332 05/27/20 0130 05/28/20 0321 05/29/20 0336  NA 136 139 138  137 137  K 3.3* 3.5 4.1 3.9 3.2*  CL 105  --  105 103 101  CO2 22  --  '27 24 26  '$ GLUCOSE 116*  --  143* 121* 133*  BUN 18  --  '15 18 20  '$ CREATININE 0.93  --  0.79 0.80 0.98  CALCIUM 7.5*  --  8.1* 8.5* 8.2*  AST 55*  --  55* 59* 62*  ALT 25  --  '25 27 30  '$ ALKPHOS 63  --  67 69 74  BILITOT 0.8  --  0.6 0.7 1.0  MG  --   --  2.1 1.9 2.0  INR 1.5*  --   --   --   --      ------------------------------------------------------------------------------------------------------------------ No results for input(s): CHOL, HDL, LDLCALC, TRIG, CHOLHDL, LDLDIRECT in the last 72 hours.  Lab Results  Component Value Date   HGBA1C 5.4 08/16/2019   ------------------------------------------------------------------------------------------------------------------ No results for input(s): TSH, T4TOTAL, T3FREE, THYROIDAB in the last 72 hours.  Invalid input(s): FREET3  Cardiac Enzymes No results for input(s): CKMB, TROPONINI, MYOGLOBIN in the last 168 hours.  Invalid input(s): CK ------------------------------------------------------------------------------------------------------------------    Component Value Date/Time   BNP 283.0 (H) 05/29/2020 0865    Micro Results Recent Results (from the past 240 hour(s))  Blood culture (routine x 2)     Status: None (Preliminary result)   Collection Time: 06/01/2020 11:41 AM   Specimen: BLOOD RIGHT FOREARM  Result Value Ref Range Status   Specimen Description BLOOD RIGHT FOREARM  Final   Special Requests   Final    BOTTLES DRAWN AEROBIC AND ANAEROBIC Blood Culture adequate volume   Culture   Final    NO GROWTH 4 DAYS Performed at [facility], [address], [place], [place] [address]    Report Status PENDING  Incomplete  Blood culture (routine x 2)     Status: None (Preliminary result)   Collection Time: 05/11/2020 11:46 AM   Specimen: BLOOD RIGHT HAND  Result Value Ref Range Status   Specimen Description BLOOD RIGHT HAND  Final    Special Requests   Final    BOTTLES DRAWN AEROBIC AND ANAEROBIC Blood Culture results may not be optimal due to an inadequate volume of blood received in culture bottles   Culture   Final    NO GROWTH 4 DAYS Performed at [facility], [place] [address] [place],  [address]    Report Status PENDING  Incomplete  SARS Coronavirus 2 by RT PCR (hospital order, performed in [facility] hospital lab) Nasopharyngeal Nasopharyngeal Swab     Status: Abnormal   Collection Time: 05/17/2020 11:49 AM   Specimen: Nasopharyngeal Swab  Result Value Ref Range Status   SARS Coronavirus 2 POSITIVE (A) NEGATIVE Final    Comment: RESULT CALLED TO, READ BACK BY AND VERIFIED WITH: RN S PLUNKETT AT 1339 06/06/2020 BY L BENFIELD (NOTE) SARS-CoV-2 target nucleic acids are DETECTED  SARS-CoV-2 RNA is generally detectable in upper respiratory specimens  during the acute phase of infection.  Positive results are indicative  of the presence of the identified virus, but do not rule out bacterial infection or co-infection with other pathogens not detected by the test.  Clinical correlation with patient history and  other diagnostic information is necessary to determine patient infection status.  The expected result is negative.  Fact Sheet for Patients:   StrictlyIdeas.no   Fact Sheet for Healthcare Providers:   BankingDealers.co.za    This test is not yet approved or cleared by the [place] FDA and  has been authorized for detection and/or diagnosis of SARS-CoV-2 by FDA under an Emergency Use Authorization (EUA).  This EUA will remain in effect (meanin g this test can be used) for the duration of  the COVID-19 declaration under Section 564(b)(1) of the Act, 21 U.S.C. section 360-bbb-3(b)(1), unless the authorization is terminated or  revoked sooner.  Performed at [facility], [place] [address] [place],  [address]     Radiology Reports DG  Chest 1 View  Result Date: 05/11/2020 CLINICAL DATA:  Altered mental status, recent UTI, history of lung cancer, lymphoma, hypertension, atrial fibrillation EXAM: CHEST  1 VIEW COMPARISON:  08/21/2019 FINDINGS: Post LEFT pneumonectomy with persistent opacification of LEFT hemithorax. Atherosclerotic calcification aorta. Perihilar infiltrates identified in RIGHT upper lobe consistent with pneumonia. Enlargement of RIGHT pulmonary hilum, could reflect prominent vascular structure, adenopathy, mass, or artifact from slight rotation to the LEFT. No pneumothorax. Bones demineralized with chronic RIGHT rotator cuff tear. IMPRESSION: Postsurgical changes LEFT pneumonectomy. New RIGHT upper lobe infiltrate consistent with pneumonia. Question RIGHT hilar mass/adenopathy versus artifact from rotation. Electronically Signed   By: Lavonia Dana M.D.   On: 05/21/2020 12:49   CT Head Wo Contrast  Result Date: 05/24/2020 CLINICAL DATA:  Altered mental status EXAM: CT HEAD WITHOUT CONTRAST TECHNIQUE: Contiguous axial images were obtained from the base of the skull through the vertex without intravenous contrast. COMPARISON:  08/16/2019 FINDINGS: Brain: Atrophic changes and scattered lacunar infarcts and chronic white matter ischemic change are seen and relatively stable from the prior exam. No findings to suggest acute hemorrhage, acute infarction or space-occupying mass lesion are noted. Vascular: No hyperdense vessel or unexpected calcification. Skull: Normal. Negative for fracture or focal lesion. Sinuses/Orbits: No acute finding. Other: None. IMPRESSION: Atrophic and ischemic changes without acute abnormality. Electronically Signed   By: Inez Catalina M.D.   On: 06/07/2020 12:52   CT CHEST W CONTRAST  Result Date: 05/30/2020 CLINICAL DATA:  Pneumonia EXAM: CT CHEST WITH CONTRAST TECHNIQUE: Multidetector CT imaging of the chest was performed during intravenous contrast administration. CONTRAST:  70m OMNIPAQUE IOHEXOL 300  MG/ML  SOLN COMPARISON:  05/29/2020 FINDINGS: Cardiovascular: Heart is unremarkable without pericardial effusion. Mild dilatation of the ascending thoracic aorta measuring 4 cm in diameter. Moderate atherosclerosis. Mediastinum/Nodes: No enlarged mediastinal, hilar, or axillary lymph nodes. Thyroid gland, trachea, and esophagus demonstrate no significant findings. Lungs/Pleura: Postsurgical changes from left pneumonectomy, with fluid filling the left chest cavity. Diffuse right upper lobe airspace disease unchanged since previous chest x-ray. Hypoventilatory changes are seen within the right lower lobe. No right effusion or pneumothorax. Upper Abdomen: No acute abnormality. Musculoskeletal: No acute or destructive bony abnormalities. Chronic wedge compression deformities at the thoracolumbar junction. Reconstructed images demonstrate no additional findings. IMPRESSION: 1. Diffuse right upper lobe ground-glass airspace disease, consistent with pneumonia. 2. Left pneumonectomy. 3. 4 cm ascending thoracic aortic aneurysm. Recommend annual imaging followup by CTA or MRA. This recommendation follows 2010 ACCF/AHA/AATS/ACR/ASA/SCA/SCAI/SIR/STS/SVM Guidelines for the Diagnosis and Management of Patients with Thoracic Aortic Disease. Circulation. 2010; 121:: [id number] Aortic aneurysm NOS (ICD10-I71.9) 4.  Aortic Atherosclerosis (ICD10-I70.0). Electronically Signed   By: MRanda NgoM.D.   On: 05/30/2020 04:08   DG Chest Port 1 View  Result Date: 05/30/2020 CLINICAL DATA:  Shortness of breath EXAM: PORTABLE CHEST 1 VIEW COMPARISON:  05/29/2020 FINDINGS: Postsurgical changes are noted on the left consistent with pneumonectomy. Right lung is well aerated with diffuse upper lobe airspace opacity stable in appearance from the prior exam. No new focal abnormality is seen. IMPRESSION: Stable changes from the previous day. Electronically Signed   By: MInez CatalinaM.D.   On: 05/30/2020 08:48   DG Chest Port 1 View  Result  Date: 05/29/2020 CLINICAL DATA:  Shortness of breath.  COVID 19 positive. EXAM: PORTABLE CHEST 1 VIEW COMPARISON:  05/28/2020 FINDINGS:  Sequelae of left pneumonectomy are again identified with unchanged opacification of the left hemithorax. Right hilar prominence is unchanged. There is persistent airspace opacity throughout the right upper lobe without significant interval change. Mild right basilar opacity has decreased. No sizable right-sided pleural effusion or pneumothorax is identified. IMPRESSION: 1. Unchanged right upper lobe pneumonia. 2. Improved right basilar aeration. Electronically Signed   By: Logan Bores M.D.   On: 05/29/2020 08:17   DG Chest Port 1 View  Result Date: 05/28/2020 CLINICAL DATA:  Shortness of breath, COVID-19 positive. EXAM: PORTABLE CHEST 1 VIEW COMPARISON:  May 27, 2020. FINDINGS: Status post left pneumonectomy. No pneumothorax is noted stable right upper lobe airspace opacity is noted consistent with pneumonia. Mildly increased right basilar opacity is noted concerning for worsening pneumonia. Bony thorax is unremarkable. IMPRESSION: Stable right upper lobe airspace opacity is noted consistent with pneumonia. Mildly increased right basilar opacity is noted concerning for worsening pneumonia. Electronically Signed   By: Marijo Conception M.D.   On: 05/28/2020 09:03   DG Chest Port 1 View  Result Date: 05/27/2020 CLINICAL DATA:  Shortness of breath, pneumonia, COVID-19 EXAM: PORTABLE CHEST 1 VIEW COMPARISON:  Portable exam 0649 hours compared to 06/06/2020 FINDINGS: Persistent complete opacification of the LEFT hemithorax postpneumonectomy. Increased RIGHT upper lobe infiltrate consistent with pneumonia. Prominence of RIGHT hilum unchanged. Minimal atelectasis at RIGHT base. No pneumothorax. Bones demineralized. IMPRESSION: Increased RIGHT upper lobe infiltrate consistent with pneumonia. Post LEFT pneumonectomy. Electronically Signed   By: Lavonia Dana M.D.   On: 05/27/2020 09:14    VAS [place] LOWER EXTREMITY VENOUS (DVT)  Result Date: 05/27/2020  Lower Venous DVTStudy Indications: Edema, Swelling, and Rapidly rising D-dimer.  Comparison Study: no prior Performing Technologist: Abram Sander RVS  Examination Guidelines: A complete evaluation includes B-mode imaging, spectral Doppler, color Doppler, and power Doppler as needed of all accessible portions of each vessel. Bilateral testing is considered an integral part of a complete examination. Limited examinations for reoccurring indications may be performed as noted. The reflux portion of the exam is performed with the patient in reverse Trendelenburg.  +---------+---------------+---------+-----------+----------+--------------+ RIGHT    CompressibilityPhasicitySpontaneityPropertiesThrombus Aging +---------+---------------+---------+-----------+----------+--------------+ CFV      Full           Yes      Yes                                 +---------+---------------+---------+-----------+----------+--------------+ SFJ      Full                                                        +---------+---------------+---------+-----------+----------+--------------+ FV Prox  Full                                                        +---------+---------------+---------+-----------+----------+--------------+ FV Mid   Full                                                        +---------+---------------+---------+-----------+----------+--------------+  FV DistalFull                                                        +---------+---------------+---------+-----------+----------+--------------+ PFV      Full                                                        +---------+---------------+---------+-----------+----------+--------------+ POP      Full           Yes      Yes                                 +---------+---------------+---------+-----------+----------+--------------+ PTV      Full                                                         +---------+---------------+---------+-----------+----------+--------------+ PERO     Full                                                        +---------+---------------+---------+-----------+----------+--------------+   +---------+---------------+---------+-----------+----------+--------------+ LEFT     CompressibilityPhasicitySpontaneityPropertiesThrombus Aging +---------+---------------+---------+-----------+----------+--------------+ CFV      Full           Yes      Yes                                 +---------+---------------+---------+-----------+----------+--------------+ SFJ      Full                                                        +---------+---------------+---------+-----------+----------+--------------+ FV Prox  Full                                                        +---------+---------------+---------+-----------+----------+--------------+ FV Mid   Full                                                        +---------+---------------+---------+-----------+----------+--------------+ FV DistalFull                                                        +---------+---------------+---------+-----------+----------+--------------+  PFV      Full                                                        +---------+---------------+---------+-----------+----------+--------------+ POP      Full           Yes      Yes                                 +---------+---------------+---------+-----------+----------+--------------+ PTV      Full                                                        +---------+---------------+---------+-----------+----------+--------------+ PERO                                                  Not visualized +---------+---------------+---------+-----------+----------+--------------+     Summary: BILATERAL: - No evidence of deep vein thrombosis seen in  the lower extremities, bilaterally. - No evidence of superficial venous thrombosis in the lower extremities, bilaterally. -   *See table(s) above for measurements and observations. Electronically signed by Servando Snare MD on 05/27/2020 at 5:30:45 PM.    Final

## 2020-05-30 NOTE — TOC Transition Note (Signed)
Transition of Care [facility]) - CM/SW Discharge Note   Patient Details  Name: Craig Chapman MRN: [id number] Date of Birth: 11/24/1927  [facility] of [facility]) CM/SW Contact:  Verdell Carmine, RN Phone Number: 05/30/2020, 9:02 AM   Clinical Narrative:     Patient has transitioned to comfort care. Family at bedside.         Patient Goals and CMS Choice        Discharge Placement             Comfort care           Discharge Plan and Services                                     Social Determinants of Health (SDOH) Interventions     Readmission Risk Interventions No flowsheet data found.

## 2020-05-30 NOTE — Progress Notes (Signed)
Spoke to patients grandson  Gave update.  Attempted to call patient wife and there was no answer. I advised patients grandson that I could not leave message due to not wanting to violate HIPPA.  He advised he would pass the message along.

## 2020-05-31 ENCOUNTER — Inpatient Hospital Stay (HOSPITAL_COMMUNITY): Payer: Medicare Other

## 2020-05-31 LAB — CULTURE, BLOOD (ROUTINE X 2)
Culture: NO GROWTH
Culture: NO GROWTH
Special Requests: ADEQUATE

## 2020-05-31 LAB — COMPREHENSIVE METABOLIC PANEL
ALT: 27 U/L (ref 0–44)
AST: 42 U/L — ABNORMAL HIGH (ref 15–41)
Albumin: 2.6 g/dL — ABNORMAL LOW (ref 3.5–5.0)
Alkaline Phosphatase: 77 U/L (ref 38–126)
Anion gap: 11 (ref 5–15)
BUN: 39 mg/dL — ABNORMAL HIGH (ref 8–23)
CO2: 28 mmol/L (ref 22–32)
Calcium: 8.8 mg/dL — ABNORMAL LOW (ref 8.9–10.3)
Chloride: 102 mmol/L (ref 98–111)
Creatinine, Ser: 0.89 mg/dL (ref 0.61–1.24)
GFR calc Af Amer: >60 mL/min (ref >60)
GFR calc non Af Amer: >60 mL/min (ref >60)
Glucose, Bld: 121 mg/dL — ABNORMAL HIGH (ref 70–99)
Potassium: 3.6 mmol/L (ref 3.5–5.1)
Sodium: 141 mmol/L (ref 135–145)
Total Bilirubin: 0.9 mg/dL (ref 0.3–1.2)
Total Protein: 7.1 g/dL (ref 6.5–8.1)

## 2020-05-31 LAB — CBC WITH DIFFERENTIAL/PLATELET
Abs Immature Granulocytes: 0.1 10*3/uL — ABNORMAL HIGH (ref 0.00–0.07)
Band Neutrophils: 2 %
Basophils Absolute: 0 10*3/uL (ref 0.0–0.1)
Basophils Relative: 0 %
Eosinophils Absolute: 0 10*3/uL (ref 0.0–0.5)
Eosinophils Relative: 0 %
HCT: 35.4 % — ABNORMAL LOW (ref 39.0–52.0)
Hemoglobin: 11.1 g/dL — ABNORMAL LOW (ref 13.0–17.0)
Lymphocytes Relative: 3 %
Lymphs Abs: 0.3 10*3/uL — ABNORMAL LOW (ref 0.7–4.0)
MCH: 29.1 pg (ref 26.0–34.0)
MCHC: 31.4 g/dL (ref 30.0–36.0)
MCV: 92.9 fL (ref 80.0–100.0)
Metamyelocytes Relative: 1 %
Monocytes Absolute: 0.1 10*3/uL (ref 0.1–1.0)
Monocytes Relative: 1 %
Neutro Abs: 8 10*3/uL — ABNORMAL HIGH (ref 1.7–7.7)
Neutrophils Relative %: 93 %
Platelets: 329 10*3/uL (ref 150–400)
RBC: 3.81 MIL/uL — ABNORMAL LOW (ref 4.22–5.81)
RDW: 15.6 % — ABNORMAL HIGH (ref 11.5–15.5)
WBC: 8.4 10*3/uL (ref 4.0–10.5)
nRBC: 0 % (ref 0.0–0.2)

## 2020-05-31 LAB — C-REACTIVE PROTEIN: CRP: 8.7 mg/dL — ABNORMAL HIGH (ref <1.0)

## 2020-05-31 LAB — D-DIMER, QUANTITATIVE: D-Dimer, Quant: 4 ug/mL-FEU — ABNORMAL HIGH (ref 0.00–0.50)

## 2020-05-31 LAB — MAGNESIUM: Magnesium: 2.3 mg/dL (ref 1.7–2.4)

## 2020-05-31 LAB — PROCALCITONIN: Procalcitonin: <0.1 ng/mL

## 2020-05-31 LAB — BRAIN NATRIURETIC PEPTIDE: B Natriuretic Peptide: 132 pg/mL — ABNORMAL HIGH (ref 0.0–100.0)

## 2020-05-31 LAB — GLUCOSE, CAPILLARY: Glucose-Capillary: 155 mg/dL — ABNORMAL HIGH (ref 70–99)

## 2020-05-31 MED ORDER — ENOXAPARIN SODIUM 60 MG/0.6ML ~~LOC~~ SOLN
60.0000 mg | Freq: Two times a day (BID) | SUBCUTANEOUS | Status: DC
  Administered 2020-05-31 – 2020-06-03 (×8): 60 mg via SUBCUTANEOUS
  Filled 2020-05-31 (×7): qty 0.6

## 2020-05-31 NOTE — Evaluation (Signed)
Physical Therapy Evaluation Patient Details Name: Craig Chapman MRN: [id number] DOB: 11-27-1927 Today's Date: 05/31/2020   History of Present Illness  86 y.o. male with medical history significant of HLD, paroxysmal A. Fib, remote history of lymphoma, remote history of lung cancer status post left pneumonectomy, presented with confusion and short of breath.  Patient started to have episodes of confusion and subjective fever, family contacted PCP who suspect patient had UTI and thus started pt on Cipro.  Patient completed 3 days of Cipro, but remained confused. +Acute hypoxic respiratory failure secondary to COVID-19 pneumonia; anemia; 7/22 medical decline and initiated comfort care measures; 7/23 pt improving and therapies re-ordered  Clinical Impression   Pt admitted with above diagnosis. Patient has had medical and cognitive decline since last seen for therapy. Although he was able to tolerate AAROM exercises in upright position from a cardiopulmonary standpoint (on HFNC 11L, sats 92-96% RR 20-24), he cognitively could not attend to task to perform AROM exercises. Earlier today, per nursing pt was trying to climb out of bed. Patient will currently require lift for OOB to chair and therefore placed in chair-position in bed. Prior to recent decline, I had a conversation with his daughter, Inez Catalina, and she was certain the family would take care of patient at home and would not want SNF. Will continue to monitor progress and discharge needs. Pt currently with functional limitations due to the deficits listed below (see PT Problem List). Pt will benefit from skilled PT to increase their independence and safety with mobility to allow discharge to the venue listed below.       Follow Up Recommendations Supervision/Assistance - 24 hour (continue to assess; ?home with Hospice)    Equipment Recommendations  Other (comment) (continuue to assess)    Recommendations for Other Services  (will see how pt  progresses medically and consider OT consult)     Precautions / Restrictions Precautions Precautions: Fall      Mobility  Bed Mobility Overal bed mobility: Needs Assistance             General bed mobility comments: Patient dependently moved into chair position in bed; unable to maintain attention to hold bil rails and pull his torso forward into unsupported sitting  Transfers                 General transfer comment: Unsafe to attempt due to decr cognition  Ambulation/Gait                Stairs            Wheelchair Mobility    Modified Rankin (Stroke Patients Only)       Balance Overall balance assessment: Needs assistance Sitting-balance support: No upper extremity supported;Feet supported Sitting balance-Leahy Scale: Poor Sitting balance - Comments: in chair position, listing to his left with no awareness                                     Pertinent Vitals/Pain Pain Assessment: Faces Faces Pain Scale: No hurt    Home Living Family/patient expects to be discharged to:: Private residence Living Arrangements: Spouse/significant other Available Help at Discharge: Family;Available 24 hours/day Type of Home: House Home Access: Ramped entrance Entrance Stairs-Rails: Right Entrance Stairs-Number of Steps: 6 Home Layout: Two level Home Equipment: Rayle - 2 wheels;Toilet riser;Shower seat;Grab bars - tub/shower;Kasandra Knudsen - single point Additional Comments: 2 golf carts (50 acres); 7/24  information from previous eval; pt unable to answer due to confusion    Prior Function Level of Independence: Needs assistance   Gait / Transfers Assistance Needed: uses RW; but sometimes "furniture walks"; previously reported he has fallen this year but not sure how many times  ADL's / Homemaking Assistance Needed: does independently until recent illness        Hand Dominance   Dominant Hand: Right    Extremity/Trunk Assessment   Upper  Extremity Assessment Upper Extremity Assessment: Generalized weakness;Difficult to assess due to impaired cognition (bil shoulder limited AAROM)    Lower Extremity Assessment Lower Extremity Assessment: Generalized weakness;Difficult to assess due to impaired cognition (bil ?limited ankle DF (pt resisting attempts to assess))    Cervical / Trunk Assessment Cervical / Trunk Assessment: Kyphotic  Communication   Communication: HOH  Cognition Arousal/Alertness: Lethargic Behavior During Therapy: Flat affect Overall Cognitive Status: Impaired/Different from baseline Area of Impairment: Orientation;Attention;Following commands;Safety/judgement;Awareness                 Orientation Level: Place;Time;Situation ("[place]" "cancer") Current Attention Level: Focused;Sustained   Following Commands: Follows one step commands inconsistently;Follows one step commands with increased time Safety/Judgement: Decreased awareness of safety;Decreased awareness of deficits (per NT pt was attempting to climb OOB earlier) Awareness:  (pre-intellectual)          General Comments General comments (skin integrity, edema, etc.): Noted pt has been weaned down from [facility] to HFNC @ 11 LPM with sats 92-96% during activity. RR 20-24    Exercises Other Exercises Other Exercises: AAROM x 4 extremities x 3-5 reps at each joint, in most planes of movement.    Assessment/Plan    PT Assessment Patient needs continued PT services  PT Problem List Decreased strength;Decreased activity tolerance;Decreased balance;Decreased mobility;Decreased cognition;Decreased knowledge of use of DME;Decreased safety awareness;Decreased knowledge of precautions;Cardiopulmonary status limiting activity;Decreased range of motion       PT Treatment Interventions DME instruction;Gait training;Functional mobility training;Therapeutic activities;Therapeutic exercise;Balance training;Cognitive remediation;Patient/family education     PT Goals (Current goals can be found in the Care Plan section)  Acute Rehab PT Goals Patient Stated Goal: pt unable to state PT Goal Formulation: Patient unable to participate in goal setting Time For Goal Achievement: 06/24/20 Potential to Achieve Goals: Good    Frequency Min 3X/week   Barriers to discharge        Co-evaluation               AM-PAC PT "6 Clicks" Mobility  Outcome Measure Help needed turning from your back to your side while in a flat bed without using bedrails?: Total Help needed moving from lying on your back to sitting on the side of a flat bed without using bedrails?: Total Help needed moving to and from a bed to a chair (including a wheelchair)?: Total Help needed standing up from a chair using your arms (e.g., wheelchair or bedside chair)?: Total Help needed to walk in hospital room?: Total Help needed climbing 3-5 steps with a railing? : Total 6 Click Score: 6    End of Session Equipment Utilized During Treatment: Oxygen Activity Tolerance: Patient limited by lethargy Patient left: with call bell/phone within reach;in bed;with bed alarm set Nurse Communication: Other (comment) (in chair position) PT Visit Diagnosis: Muscle weakness (generalized) (M62.81);Difficulty in walking, not elsewhere classified (R26.2)    Time: 1000-1021 PT Time Calculation (min) (ACUTE ONLY): 21 min   Charges:   PT Evaluation $PT Eval Low Complexity: 1 Low  Arby Barrette, PT Pager [phone]   Rexanne Mano 05/31/2020, 10:50 AM

## 2020-05-31 NOTE — Progress Notes (Signed)
PROGRESS NOTE                                                                                                                                                                                                             Patient Demographics:    Craig Chapman, is a 86 y.o. male, DOB - Jan 15, 1928, VVZ:[id number]  Outpatient Primary MD for the patient is Lavone Orn, MD    LOS - 5  Admit date - 05/19/2020    Chief Complaint  Patient presents with   Altered Mental Status       Brief Narrative  Craig Chapman is a 86 y.o. male with medical history significant of HLD, paroxysmal A. Fib, remote history of lymphoma, remote history of lung cancer status post left pneumonectomy, presented with confusion and short of breath, he was diagnosed with acute hypoxic respiratory failure due to COVID-19 pneumonia and admitted to the hospital.  Unfortunately patient was not able to get the vaccine so far.   Subjective:   Patient in bed, appears comfortable and mildly confused, denies any headache, no fever, no chest pain or pressure, no shortness of breath , no abdominal pain. No focal weakness.   Assessment  & Plan :     1. Acute Hypoxic Resp. Failure due to Acute Covid 19 Viral Pneumonitis during the ongoing 2020 Covid 19 Pandemic -he had moderate disease but quickly is progressing towards extremely severe parenchymal lung injury, on high-dose IV steroids and remdesivir along with as needed Lasix repeated IV 05/29/20, 05/30/20, currently on 11 L nasal cannula oxygen.  Poor candidate for Actemra due to advanced age and frail condition, rapidly declining, family has been informed that if worse and suffering we will focus on keeping him comfortable.  He is DNR.  Prognosis again appears poor.  Encouraged the patient to sit up in chair in the daytime when possible and use I-S and flutter valve for pulmonary toiletry and then prone in bed when at  night.  Will advance activity and titrate down oxygen as possible.   SpO2: 92 % O2 Flow Rate (L/min): 11 L/min FiO2 (%): 100 %  Recent Labs  Lab 05/19/2020 1149 06/02/2020 1200 05/27/20 0130 05/28/20 0321 05/29/20 0336 05/31/20 0653  CRP  --   --  6.9* 6.7* 11.5* 8.7*  DDIMER  --   --  3.02* 2.82* 2.81* 4.00*  BNP  --  361.5* 530.8* 236.1* 283.0* 132.0*  PROCALCITON  --   --  <0.10 <0.10 <0.10 <0.10  SARSCOV2NAA POSITIVE*  --   --   --   --   --     Hepatic Function Latest Ref Rng & Units 05/31/2020 05/29/2020 05/28/2020  Total Protein 6.5 - 8.1 g/dL 7.1 7.0 6.9  Albumin 3.5 - 5.0 g/dL 2.6(L) 2.7(L) 2.6(L)  AST 15 - 41 U/L 42(H) 62(H) 59(H)  ALT 0 - 44 U/L _0 Alk Phosphatase 38 - 126 U/L 77 74 69  Total Bilirubin 0.3 - 1.2 mg/dL 0.9 1.0 0.7    2.  History of lung cancer with questionable right hilar mass.  History of  left-sided lung lobectomy, outpatient follow-up with PCP and pulmonologist post discharge.  Does not appear to be a candidate for invasive testing or procedures.  3.  Anemia with evidence of some iron deficiency.  Placed on oral iron, continue PPI, outpatient age-appropriate anemia work-up per DCP.  Currently not a candidate for EGD or colonoscopy.  4.  Dyslipidemia on statin.  5.  Hypertension.  As needed hydralazine and monitor.  6.  Toxic encephalopathy.  Due to Covid infection.  Haldol, minimize narcotics and benzodiazepines.  Supportive care.  Family has already been warned that this might happen on 05/27/2020 and reaffirmed on 05/28/2020.  7. Hypokalemia - replaced.  8. Rising D-dimer.  Due to intense inflammation from COVID-19, initial leg ultrasound is negative, will increase Lovenox dose and monitor closely.    Condition - Extremely Guarded  Family Communication  :  Craig Chapman [phone] on 05/27/2020, 05/28/20, 05/29/20, 05/30/20, 05/31/20  Code Status : DNR  Consults  : None  Procedures  :    CTA - 1. Diffuse right upper lobe ground-glass  airspace disease, consistent with pneumonia. 2. Left pneumonectomy. 3. 4 cm ascending thoracic aortic aneurysm. Recommend annual imaging followup by CTA or MRA. This recommendation follows 2010 ACCF/AHA/AATS/ACR/ASA/SCA/SCAI/SIR/STS/SVM Guidelines for the Diagnosis and Management of Patients with Thoracic Aortic Disease. Circulation. 2010; 121: J093-O671. Aortic aneurysm NOS (ICD10-I71.9) 4.  Aortic Atherosclerosis (ICD10-I70.0)  Leg [place] - No DVT   PUD Prophylaxis : PPI  Disposition Plan  :    Status is: Inpatient  Remains inpatient appropriate because:IV treatments appropriate due to intensity of illness or inability to take PO   Dispo: The patient is from: Home              Anticipated d/c is to: Home              Anticipated d/c date is: > 3 days              Patient currently is not medically stable to d/c.   DVT Prophylaxis  :  Lovenox    Lab Results  Component Value Date   PLT 329 05/31/2020    Diet :  Diet Order            DIET SOFT Room service appropriate? Yes; Fluid consistency: Thin  Diet effective now                  Inpatient Medications  Scheduled Meds:  aspirin EC  81 mg Oral Daily   atorvastatin  40 mg Oral q1800   dexamethasone (DECADRON) injection  20 mg Intravenous Q24H   docusate sodium  200 mg Oral BID   enoxaparin (LOVENOX) injection  60 mg Subcutaneous Q24H   ferrous sulfate  325 mg Oral BID WC   Ipratropium-Albuterol  1 puff Inhalation Q6H   mouth rinse  15 mL Mouth Rinse BID   metoprolol tartrate  50 mg Oral BID   pantoprazole  40 mg Oral Daily   Continuous Infusions:  PRN Meds:.acetaminophen, guaiFENesin-dextromethorphan, haloperidol lactate, hydrALAZINE, senna-docusate  Antibiotics  :    Anti-infectives (From admission, onward)   Start     Dose/Rate Route Frequency Ordered Stop   05/27/20 1000  remdesivir 100 mg in sodium chloride 0.9 % 100 mL IVPB       "Followed by" Linked Group Details   100 mg 200 mL/hr over 30  Minutes Intravenous Daily 05/30/2020 1348 05/30/20 1030   05/09/2020 1400  remdesivir 200 mg in sodium chloride 0.9% 250 mL IVPB       "Followed by" Linked Group Details   200 mg 580 mL/hr over 30 Minutes Intravenous Once 05/13/2020 1348 05/30/2020 1715       Time Spent in minutes  30   Lala Lund M.D on 05/31/2020 at 10:00 AM  To page go to [email or web address] - password [facility]  [facility] -  Office  [phone]  See all Orders from today for further details    Objective:   Vitals:   05/31/20 0000 05/31/20 0503 05/31/20 0737 05/31/20 0826  BP:  (!) 139/66 (!) 131/58   Pulse:  71 81   Resp: _0 Temp:  97.7 F (36.5 C) 98.6 F (37 C)   TempSrc:  Axillary Oral   SpO2:  (!) 87% 92% 92%  Weight:      Height:        Wt Readings from Last 3 Encounters:  05/29/20 96 kg  01/17/20 103.8 kg  09/19/19 101.2 kg     Intake/Output Summary (Last 24 hours) at 05/31/2020 1000 Last data filed at 05/31/2020 0981 Gross per 24 hour  Intake 1065 ml  Output 1950 ml  Net -885 ml     Physical Exam  Awake, mildly confused, No new F.N deficits  [place].AT,PERRAL Supple Neck,No JVD, No cervical lymphadenopathy appriciated.  Symmetrical Chest wall movement, good air movement bilaterally with mild to moderate bibasilar Rales RRR,No Gallops, Rubs or new Murmurs, No Parasternal Heave +ve B.Sounds, Abd Soft, No tenderness, No organomegaly appriciated, No rebound - guarding or rigidity. No Cyanosis, Clubbing or edema,      Data Review:    CBC Recent Labs  Lab 05/30/2020 1200 05/20/2020 1200 05/12/2020 1332 05/27/20 0130 05/28/20 0321 05/29/20 0336 05/31/20 0653  WBC 3.0*  --   --  1.8* 5.0 8.1 8.4  HGB 8.6*   < > 11.9* 9.7* 10.4* 10.7* 11.1*  HCT 27.5*   < > 35.0* 30.7* 33.7* 33.3* 35.4*  PLT 148*  --   --  173 201 244 329  MCV 92.6  --   --  92.7 92.6 91.2 92.9  MCH 29.0  --   --  29.3 28.6 29.3 29.1  MCHC 31.3  --   --  31.6 30.9 32.1 31.4  RDW 15.3  --   --  15.1 15.3 15.2  15.6*  LYMPHSABS 0.6*  --   --  0.6* 0.9 0.8 PENDING  MONOABS 0.3  --   --  0.1 0.4 0.5 PENDING  EOSABS 0.0  --   --  0.0 0.0 0.0 PENDING  BASOSABS 0.0  --   --  0.0 0.0 0.0 PENDING   < > = values in this interval not displayed.  Chemistries  Recent Labs  Lab 05/24/2020 1200 05/10/2020 1200 05/25/2020 1332 05/27/20 0130 05/28/20 0321 05/29/20 0336 05/31/20 0653  NA 136   < > 139 138 137 137 141  K 3.3*   < > 3.5 4.1 3.9 3.2* 3.6  CL 105  --   --  105 103 101 102  CO2 22  --   --  _0 GLUCOSE 116*  --   --  143* 121* 133* 121*  BUN 18  --   --  _1 39*  CREATININE 0.93  --   --  0.79 0.80 0.98 0.89  CALCIUM 7.5*  --   --  8.1* 8.5* 8.2* 8.8*  AST 55*  --   --  55* 59* 62* 42*  ALT 25  --   --  _2 ALKPHOS 63  --   --  67 69 74 77  BILITOT 0.8  --   --  0.6 0.7 1.0 0.9  MG  --   --   --  2.1 1.9 2.0 2.3  INR 1.5*  --   --   --   --   --   --    < > = values in this interval not displayed.     ------------------------------------------------------------------------------------------------------------------ No results for input(s): CHOL, HDL, LDLCALC, TRIG, CHOLHDL, LDLDIRECT in the last 72 hours.  Lab Results  Component Value Date   HGBA1C 5.4 08/16/2019   ------------------------------------------------------------------------------------------------------------------ No results for input(s): TSH, T4TOTAL, T3FREE, THYROIDAB in the last 72 hours.  Invalid input(s): FREET3  Cardiac Enzymes No results for input(s): CKMB, TROPONINI, MYOGLOBIN in the last 168 hours.  Invalid input(s): CK ------------------------------------------------------------------------------------------------------------------    Component Value Date/Time   BNP 132.0 (H) 05/31/2020 9357    Micro Results Recent Results (from the past 240 hour(s))  Blood culture (routine x 2)     Status: None   Collection Time: 06/02/2020 11:41 AM   Specimen: BLOOD RIGHT FOREARM  Result  Value Ref Range Status   Specimen Description BLOOD RIGHT FOREARM  Final   Special Requests   Final    BOTTLES DRAWN AEROBIC AND ANAEROBIC Blood Culture adequate volume   Culture   Final    NO GROWTH 5 DAYS Performed at [facility], [address], [place], [place] [address]    Report Status 05/31/2020 FINAL  Final  Blood culture (routine x 2)     Status: None   Collection Time: 06/06/2020 11:46 AM   Specimen: BLOOD RIGHT HAND  Result Value Ref Range Status   Specimen Description BLOOD RIGHT HAND  Final   Special Requests   Final    BOTTLES DRAWN AEROBIC AND ANAEROBIC Blood Culture results may not be optimal due to an inadequate volume of blood received in culture bottles   Culture   Final    NO GROWTH 5 DAYS Performed at [facility], [place] [address] [place], [place] [address]    Report Status 05/31/2020 FINAL  Final  SARS Coronavirus 2 by RT PCR (hospital order, performed in [facility] hospital lab) Nasopharyngeal Nasopharyngeal Swab     Status: Abnormal   Collection Time: 05/18/2020 11:49 AM   Specimen: Nasopharyngeal Swab  Result Value Ref Range Status   SARS Coronavirus 2 POSITIVE (A) NEGATIVE Final    Comment: RESULT CALLED TO, READ BACK BY AND VERIFIED WITH: RN S PLUNKETT AT 1339 06/01/2020 BY L BENFIELD (NOTE) SARS-CoV-2 target nucleic acids are  DETECTED  SARS-CoV-2 RNA is generally detectable in upper respiratory specimens  during the acute phase of infection.  Positive results are indicative  of the presence of the identified virus, but do not rule out bacterial infection or co-infection with other pathogens not detected by the test.  Clinical correlation with patient history and  other diagnostic information is necessary to determine patient infection status.  The expected result is negative.  Fact Sheet for Patients:   StrictlyIdeas.no   Fact Sheet for Healthcare Providers:   BankingDealers.co.za    This  test is not yet approved or cleared by the [place] FDA and  has been authorized for detection and/or diagnosis of SARS-CoV-2 by FDA under an Emergency Use Authorization (EUA).  This EUA will remain in effect (meanin g this test can be used) for the duration of  the COVID-19 declaration under Section 564(b)(1) of the Act, 21 U.S.C. section 360-bbb-3(b)(1), unless the authorization is terminated or revoked sooner.  Performed at [facility], [place] [address] [place], [place] [address]     Radiology Reports DG Chest 1 View  Result Date: 06/07/2020 CLINICAL DATA:  Altered mental status, recent UTI, history of lung cancer, lymphoma, hypertension, atrial fibrillation EXAM: CHEST  1 VIEW COMPARISON:  08/21/2019 FINDINGS: Post LEFT pneumonectomy with persistent opacification of LEFT hemithorax. Atherosclerotic calcification aorta. Perihilar infiltrates identified in RIGHT upper lobe consistent with pneumonia. Enlargement of RIGHT pulmonary hilum, could reflect prominent vascular structure, adenopathy, mass, or artifact from slight rotation to the LEFT. No pneumothorax. Bones demineralized with chronic RIGHT rotator cuff tear. IMPRESSION: Postsurgical changes LEFT pneumonectomy. New RIGHT upper lobe infiltrate consistent with pneumonia. Question RIGHT hilar mass/adenopathy versus artifact from rotation. Electronically Signed   By: Lavonia Dana M.D.   On: 05/13/2020 12:49   CT Head Wo Contrast  Result Date: 05/28/2020 CLINICAL DATA:  Altered mental status EXAM: CT HEAD WITHOUT CONTRAST TECHNIQUE: Contiguous axial images were obtained from the base of the skull through the vertex without intravenous contrast. COMPARISON:  08/16/2019 FINDINGS: Brain: Atrophic changes and scattered lacunar infarcts and chronic white matter ischemic change are seen and relatively stable from the prior exam. No findings to suggest acute hemorrhage, acute infarction or space-occupying mass lesion are noted. Vascular:  No hyperdense vessel or unexpected calcification. Skull: Normal. Negative for fracture or focal lesion. Sinuses/Orbits: No acute finding. Other: None. IMPRESSION: Atrophic and ischemic changes without acute abnormality. Electronically Signed   By: Inez Catalina M.D.   On: 05/28/2020 12:52   CT CHEST W CONTRAST  Result Date: 05/30/2020 CLINICAL DATA:  Pneumonia EXAM: CT CHEST WITH CONTRAST TECHNIQUE: Multidetector CT imaging of the chest was performed during intravenous contrast administration. CONTRAST:  50m OMNIPAQUE IOHEXOL 300 MG/ML  SOLN COMPARISON:  05/29/2020 FINDINGS: Cardiovascular: Heart is unremarkable without pericardial effusion. Mild dilatation of the ascending thoracic aorta measuring 4 cm in diameter. Moderate atherosclerosis. Mediastinum/Nodes: No enlarged mediastinal, hilar, or axillary lymph nodes. Thyroid gland, trachea, and esophagus demonstrate no significant findings. Lungs/Pleura: Postsurgical changes from left pneumonectomy, with fluid filling the left chest cavity. Diffuse right upper lobe airspace disease unchanged since previous chest x-ray. Hypoventilatory changes are seen within the right lower lobe. No right effusion or pneumothorax. Upper Abdomen: No acute abnormality. Musculoskeletal: No acute or destructive bony abnormalities. Chronic wedge compression deformities at the thoracolumbar junction. Reconstructed images demonstrate no additional findings. IMPRESSION: 1. Diffuse right upper lobe ground-glass airspace disease, consistent with pneumonia. 2. Left pneumonectomy. 3. 4 cm ascending thoracic aortic aneurysm. Recommend  annual imaging followup by CTA or MRA. This recommendation follows 2010 ACCF/AHA/AATS/ACR/ASA/SCA/SCAI/SIR/STS/SVM Guidelines for the Diagnosis and Management of Patients with Thoracic Aortic Disease. Circulation. 2010; 121: Z610-R604. Aortic aneurysm NOS (ICD10-I71.9) 4.  Aortic Atherosclerosis (ICD10-I70.0). Electronically Signed   By: Randa Ngo M.D.    On: 05/30/2020 04:08   DG Chest Port 1 View  Result Date: 05/30/2020 CLINICAL DATA:  Shortness of breath EXAM: PORTABLE CHEST 1 VIEW COMPARISON:  05/29/2020 FINDINGS: Postsurgical changes are noted on the left consistent with pneumonectomy. Right lung is well aerated with diffuse upper lobe airspace opacity stable in appearance from the prior exam. No new focal abnormality is seen. IMPRESSION: Stable changes from the previous day. Electronically Signed   By: Inez Catalina M.D.   On: 05/30/2020 08:48   DG Chest Port 1 View  Result Date: 05/29/2020 CLINICAL DATA:  Shortness of breath.  COVID 19 positive. EXAM: PORTABLE CHEST 1 VIEW COMPARISON:  05/28/2020 FINDINGS: Sequelae of left pneumonectomy are again identified with unchanged opacification of the left hemithorax. Right hilar prominence is unchanged. There is persistent airspace opacity throughout the right upper lobe without significant interval change. Mild right basilar opacity has decreased. No sizable right-sided pleural effusion or pneumothorax is identified. IMPRESSION: 1. Unchanged right upper lobe pneumonia. 2. Improved right basilar aeration. Electronically Signed   By: Logan Bores M.D.   On: 05/29/2020 08:17   DG Chest Port 1 View  Result Date: 05/28/2020 CLINICAL DATA:  Shortness of breath, COVID-19 positive. EXAM: PORTABLE CHEST 1 VIEW COMPARISON:  May 27, 2020. FINDINGS: Status post left pneumonectomy. No pneumothorax is noted stable right upper lobe airspace opacity is noted consistent with pneumonia. Mildly increased right basilar opacity is noted concerning for worsening pneumonia. Bony thorax is unremarkable. IMPRESSION: Stable right upper lobe airspace opacity is noted consistent with pneumonia. Mildly increased right basilar opacity is noted concerning for worsening pneumonia. Electronically Signed   By: Marijo Conception M.D.   On: 05/28/2020 09:03   DG Chest Port 1 View  Result Date: 05/27/2020 CLINICAL DATA:  Shortness of  breath, pneumonia, COVID-19 EXAM: PORTABLE CHEST 1 VIEW COMPARISON:  Portable exam 0649 hours compared to 05/30/2020 FINDINGS: Persistent complete opacification of the LEFT hemithorax postpneumonectomy. Increased RIGHT upper lobe infiltrate consistent with pneumonia. Prominence of RIGHT hilum unchanged. Minimal atelectasis at RIGHT base. No pneumothorax. Bones demineralized. IMPRESSION: Increased RIGHT upper lobe infiltrate consistent with pneumonia. Post LEFT pneumonectomy. Electronically Signed   By: Lavonia Dana M.D.   On: 05/27/2020 09:14   VAS [place] LOWER EXTREMITY VENOUS (DVT)  Result Date: 05/27/2020  Lower Venous DVTStudy Indications: Edema, Swelling, and Rapidly rising D-dimer.  Comparison Study: no prior Performing Technologist: Abram Sander RVS  Examination Guidelines: A complete evaluation includes B-mode imaging, spectral Doppler, color Doppler, and power Doppler as needed of all accessible portions of each vessel. Bilateral testing is considered an integral part of a complete examination. Limited examinations for reoccurring indications may be performed as noted. The reflux portion of the exam is performed with the patient in reverse Trendelenburg.  +---------+---------------+---------+-----------+----------+--------------+  RIGHT     Compressibility Phasicity Spontaneity Properties Thrombus Aging  +---------+---------------+---------+-----------+----------+--------------+  CFV       Full            Yes       Yes                                    +---------+---------------+---------+-----------+----------+--------------+  SFJ       Full                                                             +---------+---------------+---------+-----------+----------+--------------+  FV Prox   Full                                                             +---------+---------------+---------+-----------+----------+--------------+  FV Mid    Full                                                              +---------+---------------+---------+-----------+----------+--------------+  FV Distal Full                                                             +---------+---------------+---------+-----------+----------+--------------+  PFV       Full                                                             +---------+---------------+---------+-----------+----------+--------------+  POP       Full            Yes       Yes                                    +---------+---------------+---------+-----------+----------+--------------+  PTV       Full                                                             +---------+---------------+---------+-----------+----------+--------------+  PERO      Full                                                             +---------+---------------+---------+-----------+----------+--------------+   +---------+---------------+---------+-----------+----------+--------------+  LEFT      Compressibility Phasicity Spontaneity Properties Thrombus Aging  +---------+---------------+---------+-----------+----------+--------------+  CFV       Full            Yes       Yes                                    +---------+---------------+---------+-----------+----------+--------------+  SFJ       Full                                                             +---------+---------------+---------+-----------+----------+--------------+  FV Prox   Full                                                             +---------+---------------+---------+-----------+----------+--------------+  FV Mid    Full                                                             +---------+---------------+---------+-----------+----------+--------------+  FV Distal Full                                                             +---------+---------------+---------+-----------+----------+--------------+  PFV       Full                                                              +---------+---------------+---------+-----------+----------+--------------+  POP       Full            Yes       Yes                                    +---------+---------------+---------+-----------+----------+--------------+  PTV       Full                                                             +---------+---------------+---------+-----------+----------+--------------+  PERO                                                       Not visualized  +---------+---------------+---------+-----------+----------+--------------+     Summary: BILATERAL: - No evidence of deep vein thrombosis seen in the lower extremities, bilaterally. - No evidence of superficial venous thrombosis in the lower extremities, bilaterally. -   *See table(s) above for measurements and observations. Electronically signed by Servando Snare MD on 05/27/2020 at 5:30:45 PM.    Final

## 2020-06-01 LAB — CBC WITH DIFFERENTIAL/PLATELET
Abs Immature Granulocytes: 0.14 10*3/uL — ABNORMAL HIGH (ref 0.00–0.07)
Basophils Absolute: 0.1 10*3/uL (ref 0.0–0.1)
Basophils Relative: 1 %
Eosinophils Absolute: 0.1 10*3/uL (ref 0.0–0.5)
Eosinophils Relative: 1 %
HCT: 33.3 % — ABNORMAL LOW (ref 39.0–52.0)
Hemoglobin: 10.2 g/dL — ABNORMAL LOW (ref 13.0–17.0)
Immature Granulocytes: 1 %
Lymphocytes Relative: 8 %
Lymphs Abs: 0.9 10*3/uL (ref 0.7–4.0)
MCH: 28.6 pg (ref 26.0–34.0)
MCHC: 30.6 g/dL (ref 30.0–36.0)
MCV: 93.3 fL (ref 80.0–100.0)
Monocytes Absolute: 0.5 10*3/uL (ref 0.1–1.0)
Monocytes Relative: 5 %
Neutro Abs: 9.5 10*3/uL — ABNORMAL HIGH (ref 1.7–7.7)
Neutrophils Relative %: 84 %
Platelets: 341 10*3/uL (ref 150–400)
RBC: 3.57 MIL/uL — ABNORMAL LOW (ref 4.22–5.81)
RDW: 15.6 % — ABNORMAL HIGH (ref 11.5–15.5)
WBC: 10.5 10*3/uL (ref 4.0–10.5)
nRBC: 0 % (ref 0.0–0.2)

## 2020-06-01 LAB — D-DIMER, QUANTITATIVE: D-Dimer, Quant: 3.15 ug/mL-FEU — ABNORMAL HIGH (ref 0.00–0.50)

## 2020-06-01 LAB — COMPREHENSIVE METABOLIC PANEL
ALT: 26 U/L (ref 0–44)
AST: 37 U/L (ref 15–41)
Albumin: 2.5 g/dL — ABNORMAL LOW (ref 3.5–5.0)
Alkaline Phosphatase: 77 U/L (ref 38–126)
Anion gap: 9 (ref 5–15)
BUN: 40 mg/dL — ABNORMAL HIGH (ref 8–23)
CO2: 29 mmol/L (ref 22–32)
Calcium: 8.7 mg/dL — ABNORMAL LOW (ref 8.9–10.3)
Chloride: 105 mmol/L (ref 98–111)
Creatinine, Ser: 0.9 mg/dL (ref 0.61–1.24)
GFR calc Af Amer: >60 mL/min (ref >60)
GFR calc non Af Amer: >60 mL/min (ref >60)
Glucose, Bld: 130 mg/dL — ABNORMAL HIGH (ref 70–99)
Potassium: 4.4 mmol/L (ref 3.5–5.1)
Sodium: 143 mmol/L (ref 135–145)
Total Bilirubin: 1.1 mg/dL (ref 0.3–1.2)
Total Protein: 7.1 g/dL (ref 6.5–8.1)

## 2020-06-01 LAB — MAGNESIUM: Magnesium: 2.4 mg/dL (ref 1.7–2.4)

## 2020-06-01 LAB — BRAIN NATRIURETIC PEPTIDE: B Natriuretic Peptide: 131 pg/mL — ABNORMAL HIGH (ref 0.0–100.0)

## 2020-06-01 LAB — PROCALCITONIN: Procalcitonin: <0.1 ng/mL

## 2020-06-01 LAB — C-REACTIVE PROTEIN: CRP: 8.4 mg/dL — ABNORMAL HIGH (ref <1.0)

## 2020-06-01 MED ORDER — FUROSEMIDE 10 MG/ML IJ SOLN
20.0000 mg | Freq: Once | INTRAMUSCULAR | Status: AC
  Administered 2020-06-01: 20 mg via INTRAVENOUS
  Filled 2020-06-01: qty 2

## 2020-06-01 NOTE — Progress Notes (Signed)
Patient currently only needing 4L of N/C for oxygen saturation of 96%, tolerating it well, without any complaints of SOB. will continue to titrate oxygen as need it.

## 2020-06-01 NOTE — Progress Notes (Signed)
Family at bedside attempting to feed pt dinner, family stated that pt had said he was "unable to swallow". Family very addiment that pt needs to be fed regardless of him stating that he could not swallow. Attempted to give pt a drink of ensure, pt swallowed with difficulty and proceeded to cough multiple times.  Spoke with family and made them aware that I did not feel comfortable with feeding pt until speech therapy comes and evaluates pt. Family verbalized understanding, will place speech eval consult at this time.

## 2020-06-01 NOTE — Progress Notes (Signed)
PROGRESS NOTE                                                                                                                                                                                                             Patient Demographics:    Craig Chapman, is a 86 y.o. male, DOB - 01/05/1928, LNZ:[id number]  Outpatient Primary MD for the patient is Lavone Orn, MD    LOS - 6  Admit date - 05/08/2020    Chief Complaint  Patient presents with  . Altered Mental Status       Brief Narrative  Craig Chapman is a 86 y.o. male with medical history significant of HLD, paroxysmal A. Fib, remote history of lymphoma, remote history of lung cancer status post left pneumonectomy, presented with confusion and short of breath, he was diagnosed with acute hypoxic respiratory failure due to COVID-19 pneumonia and admitted to the hospital.  Unfortunately patient was not able to get the vaccine so far.   Subjective:   Patient in bed, appears comfortable, denies any headache, no fever, no chest pain or pressure, no shortness of breath , no abdominal pain. No focal weakness.   Assessment  & Plan :     1. Acute Hypoxic Resp. Failure due to Acute Covid 19 Viral Pneumonitis during the ongoing 2020 Covid 19 Pandemic -he had moderate disease but quickly is progressing towards extremely severe parenchymal lung injury, on high-dose IV steroids and remdesivir along with as needed Lasix repeated IV 05/29/20, 05/30/20, currently on 11 L nasal cannula oxygen.  Family has been informed that if worse and suffering we will focus on keeping him comfortable.  He is DNR.  Prognosis again appears poor.  Encouraged the patient to sit up in chair in the daytime when possible and use I-S and flutter valve for pulmonary toiletry and then prone in bed when at night.  Will advance activity and titrate down oxygen as possible.   SpO2: 91 % O2 Flow Rate (L/min):  11 L/min FiO2 (%): 100 %  Recent Labs  Lab 05/30/2020 1149 05/15/2020 1200 05/27/20 0130 05/28/20 0321 05/29/20 0336 05/31/20 0653 06/01/20 0353  CRP  --   --  6.9* 6.7* 11.5* 8.7* 8.4*  DDIMER  --   --  3.02* 2.82* 2.81* 4.00* 3.15*  BNP  --    < >  530.8* 236.1* 283.0* 132.0* 131.0*  PROCALCITON  --   --  <0.10 <0.10 <0.10 <0.10 <0.10  SARSCOV2NAA POSITIVE*  --   --   --   --   --   --    < > = values in this interval not displayed.    Hepatic Function Latest Ref Rng & Units 06/01/2020 05/31/2020 05/29/2020  Total Protein 6.5 - 8.1 g/dL 7.1 7.1 7.0  Albumin 3.5 - 5.0 g/dL 2.5(L) 2.6(L) 2.7(L)  AST 15 - 41 U/L 37 42(H) 62(H)  ALT 0 - 44 U/L '26 27 30  '$ Alk Phosphatase 38 - 126 U/L 77 77 74  Total Bilirubin 0.3 - 1.2 mg/dL 1.1 0.9 1.0    2.  History of lung cancer with questionable right hilar mass.  History of  left-sided lung lobectomy, outpatient follow-up with PCP and pulmonologist post discharge.  Does not appear to be a candidate for invasive testing or procedures.  3.  Anemia with evidence of some iron deficiency.  Placed on oral iron, continue PPI, outpatient age-appropriate anemia work-up per DCP.  Currently not a candidate for EGD or colonoscopy.  4.  Dyslipidemia on statin.  5.  Hypertension.  As needed hydralazine and monitor.  6.  Toxic encephalopathy.  Due to Covid infection.  Haldol, minimize narcotics and benzodiazepines.  Supportive care.  Family has already been warned that this might happen on 05/27/2020 and reaffirmed on 05/28/2020.  7. Hypokalemia - replaced.  8. Rising D-dimer.  Due to intense inflammation from COVID-19, initial leg ultrasound is negative, will increase Lovenox dose and monitor closely.  Lab Results  Component Value Date   DDIMER 3.15 (H) 06/01/2020     Condition - Extremely Guarded  Family Communication  :  Burris Matherne [phone] on 05/27/2020, 05/28/20, 05/29/20, 05/30/20, 05/31/20, 06/01/20  Code Status : DNR  Consults  :  None  Procedures  :    CTA - 1. Diffuse right upper lobe ground-glass airspace disease, consistent with pneumonia. 2. Left pneumonectomy. 3. 4 cm ascending thoracic aortic aneurysm. Recommend annual imaging followup by CTA or MRA. This recommendation follows 2010 ACCF/AHA/AATS/ACR/ASA/SCA/SCAI/SIR/STS/SVM Guidelines for the Diagnosis and Management of Patients with Thoracic Aortic Disease. Circulation. 2010; 121: W299-B716. Aortic aneurysm NOS (ICD10-I71.9) 4.  Aortic Atherosclerosis (ICD10-I70.0)  Leg [place] - No DVT   PUD Prophylaxis : PPI  Disposition Plan  :    Status is: Inpatient  Remains inpatient appropriate because:IV treatments appropriate due to intensity of illness or inability to take PO   Dispo: The patient is from: Home              Anticipated d/c is to: Home              Anticipated d/c date is: > 3 days              Patient currently is not medically stable to d/c.   DVT Prophylaxis  :  Lovenox    Lab Results  Component Value Date   PLT 341 06/01/2020    Diet :  Diet Order            DIET SOFT Room service appropriate? Yes; Fluid consistency: Thin  Diet effective now                  Inpatient Medications  Scheduled Meds: . aspirin EC  81 mg Oral Daily  . atorvastatin  40 mg Oral q1800  . dexamethasone (DECADRON) injection  20 mg Intravenous Q24H  .  docusate sodium  200 mg Oral BID  . enoxaparin (LOVENOX) injection  60 mg Subcutaneous Q12H  . ferrous sulfate  325 mg Oral BID WC  . Ipratropium-Albuterol  1 puff Inhalation Q6H  . mouth rinse  15 mL Mouth Rinse BID  . metoprolol tartrate  50 mg Oral BID  . pantoprazole  40 mg Oral Daily   Continuous Infusions:  PRN Meds:.acetaminophen, guaiFENesin-dextromethorphan, haloperidol lactate, hydrALAZINE, senna-docusate  Antibiotics  :    Anti-infectives (From admission, onward)   Start     Dose/Rate Route Frequency Ordered Stop   05/27/20 1000  remdesivir 100 mg in sodium chloride 0.9 % 100 mL IVPB        "Followed by" Linked Group Details   100 mg 200 mL/hr over 30 Minutes Intravenous Daily 05/19/2020 1348 05/30/20 1030   05/30/2020 1400  remdesivir 200 mg in sodium chloride 0.9% 250 mL IVPB       "Followed by" Linked Group Details   200 mg 580 mL/hr over 30 Minutes Intravenous Once 05/13/2020 1348 05/23/2020 1715       Time Spent in minutes  30   Lala Lund M.D on 06/01/2020 at 10:06 AM  To page go to [email or web address] - password [email or web address]  [facility] -  Office  [phone]  See all Orders from today for further details    Objective:   Vitals:   05/31/20 1232 05/31/20 2059 06/01/20 0400 06/01/20 0946  BP: (!) 116/58 (!) 124/57 (!) 145/77 122/74  Pulse: 65 67 69 72  Resp: '19 17 19   '$ Temp: 98 F (36.7 C) 98.3 F (36.8 C) 98.1 F (36.7 C)   TempSrc: Oral Oral Axillary   SpO2: 94% 90% 91%   Weight:      Height:        Wt Readings from Last 3 Encounters:  05/29/20 96 kg  01/17/20 103.8 kg  09/19/19 101.2 kg     Intake/Output Summary (Last 24 hours) at 06/01/2020 1006 Last data filed at 05/31/2020 1600 Gross per 24 hour  Intake 120 ml  Output 200 ml  Net -80 ml     Physical Exam  Awake, mildly confused but appears to be in no distress [place].AT,PERRAL Supple Neck,No JVD, No cervical lymphadenopathy appriciated.  Symmetrical Chest wall movement, Good air movement bilaterally, bilateral coarse breath sounds mostly upper airway RRR,No Gallops, Rubs or new Murmurs, No Parasternal Heave +ve B.Sounds, Abd Soft, No tenderness, No organomegaly appriciated, No rebound - guarding or rigidity. No Cyanosis, Clubbing or edema, No new Rash or bruise    Data Review:    CBC Recent Labs  Lab 05/27/20 0130 05/28/20 0321 05/29/20 0336 05/31/20 0653 06/01/20 0353  WBC 1.8* 5.0 8.1 8.4 10.5  HGB 9.7* 10.4* 10.7* 11.1* 10.2*  HCT 30.7* 33.7* 33.3* 35.4* 33.3*  PLT 173 201 244 329 341  MCV 92.7 92.6 91.2 92.9 93.3  MCH 29.3 28.6 29.3 29.1 28.6  MCHC 31.6 30.9 32.1  31.4 30.6  RDW 15.1 15.3 15.2 15.6* 15.6*  LYMPHSABS 0.6* 0.9 0.8 0.3* 0.9  MONOABS 0.1 0.4 0.5 0.1 0.5  EOSABS 0.0 0.0 0.0 0.0 0.1  BASOSABS 0.0 0.0 0.0 0.0 0.1    Chemistries  Recent Labs  Lab 05/30/2020 1200 05/25/2020 1332 05/27/20 0130 05/28/20 0321 05/29/20 0336 05/31/20 0653 06/01/20 0353  NA 136   < > 138 137 137 141 143  K 3.3*   < > 4.1 3.9 3.2* 3.6 4.4  CL 105   < > 105 103  101 102 105  CO2 22   < > '27 24 26 28 29  '$ GLUCOSE 116*   < > 143* 121* 133* 121* 130*  BUN 18   < > '15 18 20 '$ 39* 40*  CREATININE 0.93   < > 0.79 0.80 0.98 0.89 0.90  CALCIUM 7.5*   < > 8.1* 8.5* 8.2* 8.8* 8.7*  AST 55*   < > 55* 59* 62* 42* 37  ALT 25   < > '25 27 30 27 26  '$ ALKPHOS 63   < > 67 69 74 77 77  BILITOT 0.8   < > 0.6 0.7 1.0 0.9 1.1  MG  --   --  2.1 1.9 2.0 2.3 2.4  INR 1.5*  --   --   --   --   --   --    < > = values in this interval not displayed.     ------------------------------------------------------------------------------------------------------------------ No results for input(s): CHOL, HDL, LDLCALC, TRIG, CHOLHDL, LDLDIRECT in the last 72 hours.  Lab Results  Component Value Date   HGBA1C 5.4 08/16/2019   ------------------------------------------------------------------------------------------------------------------ No results for input(s): TSH, T4TOTAL, T3FREE, THYROIDAB in the last 72 hours.  Invalid input(s): FREET3  Cardiac Enzymes No results for input(s): CKMB, TROPONINI, MYOGLOBIN in the last 168 hours.  Invalid input(s): CK ------------------------------------------------------------------------------------------------------------------    Component Value Date/Time   BNP 131.0 (H) 06/01/2020 0353    Micro Results Recent Results (from the past 240 hour(s))  Blood culture (routine x 2)     Status: None   Collection Time: 05/09/2020 11:41 AM   Specimen: BLOOD RIGHT FOREARM  Result Value Ref Range Status   Specimen Description BLOOD RIGHT FOREARM   Final   Special Requests   Final    BOTTLES DRAWN AEROBIC AND ANAEROBIC Blood Culture adequate volume   Culture   Final    NO GROWTH 5 DAYS Performed at [facility], [address], [place], [place] [address]    Report Status 05/31/2020 FINAL  Final  Blood culture (routine x 2)     Status: None   Collection Time: 05/11/2020 11:46 AM   Specimen: BLOOD RIGHT HAND  Result Value Ref Range Status   Specimen Description BLOOD RIGHT HAND  Final   Special Requests   Final    BOTTLES DRAWN AEROBIC AND ANAEROBIC Blood Culture results may not be optimal due to an inadequate volume of blood received in culture bottles   Culture   Final    NO GROWTH 5 DAYS Performed at [facility], [place] [address] [place], [place] [address]    Report Status 05/31/2020 FINAL  Final  SARS Coronavirus 2 by RT PCR (hospital order, performed in [facility] hospital lab) Nasopharyngeal Nasopharyngeal Swab     Status: Abnormal   Collection Time: 06/03/2020 11:49 AM   Specimen: Nasopharyngeal Swab  Result Value Ref Range Status   SARS Coronavirus 2 POSITIVE (A) NEGATIVE Final    Comment: RESULT CALLED TO, READ BACK BY AND VERIFIED WITH: RN S PLUNKETT AT 1339 05/20/2020 BY L BENFIELD (NOTE) SARS-CoV-2 target nucleic acids are DETECTED  SARS-CoV-2 RNA is generally detectable in upper respiratory specimens  during the acute phase of infection.  Positive results are indicative  of the presence of the identified virus, but do not rule out bacterial infection or co-infection with other pathogens not detected by the test.  Clinical correlation with patient history and  other diagnostic information is necessary to determine patient infection  status.  The expected result is negative.  Fact Sheet for Patients:   StrictlyIdeas.no   Fact Sheet for Healthcare Providers:   BankingDealers.co.za    This test is not yet approved or cleared by the [place] FDA and  has  been authorized for detection and/or diagnosis of SARS-CoV-2 by FDA under an Emergency Use Authorization (EUA).  This EUA will remain in effect (meanin g this test can be used) for the duration of  the COVID-19 declaration under Section 564(b)(1) of the Act, 21 U.S.C. section 360-bbb-3(b)(1), unless the authorization is terminated or revoked sooner.  Performed at [facility], [place] [address] [place], [place] [address]     Radiology Reports DG Chest 1 View  Result Date: 05/25/2020 CLINICAL DATA:  Altered mental status, recent UTI, history of lung cancer, lymphoma, hypertension, atrial fibrillation EXAM: CHEST  1 VIEW COMPARISON:  08/21/2019 FINDINGS: Post LEFT pneumonectomy with persistent opacification of LEFT hemithorax. Atherosclerotic calcification aorta. Perihilar infiltrates identified in RIGHT upper lobe consistent with pneumonia. Enlargement of RIGHT pulmonary hilum, could reflect prominent vascular structure, adenopathy, mass, or artifact from slight rotation to the LEFT. No pneumothorax. Bones demineralized with chronic RIGHT rotator cuff tear. IMPRESSION: Postsurgical changes LEFT pneumonectomy. New RIGHT upper lobe infiltrate consistent with pneumonia. Question RIGHT hilar mass/adenopathy versus artifact from rotation. Electronically Signed   By: Lavonia Dana M.D.   On: 06/06/2020 12:49   CT Head Wo Contrast  Result Date: 06/03/2020 CLINICAL DATA:  Altered mental status EXAM: CT HEAD WITHOUT CONTRAST TECHNIQUE: Contiguous axial images were obtained from the base of the skull through the vertex without intravenous contrast. COMPARISON:  08/16/2019 FINDINGS: Brain: Atrophic changes and scattered lacunar infarcts and chronic white matter ischemic change are seen and relatively stable from the prior exam. No findings to suggest acute hemorrhage, acute infarction or space-occupying mass lesion are noted. Vascular: No hyperdense vessel or unexpected calcification. Skull: Normal.  Negative for fracture or focal lesion. Sinuses/Orbits: No acute finding. Other: None. IMPRESSION: Atrophic and ischemic changes without acute abnormality. Electronically Signed   By: Inez Catalina M.D.   On: 06/07/2020 12:52   CT CHEST W CONTRAST  Result Date: 05/30/2020 CLINICAL DATA:  Pneumonia EXAM: CT CHEST WITH CONTRAST TECHNIQUE: Multidetector CT imaging of the chest was performed during intravenous contrast administration. CONTRAST:  9m OMNIPAQUE IOHEXOL 300 MG/ML  SOLN COMPARISON:  05/29/2020 FINDINGS: Cardiovascular: Heart is unremarkable without pericardial effusion. Mild dilatation of the ascending thoracic aorta measuring 4 cm in diameter. Moderate atherosclerosis. Mediastinum/Nodes: No enlarged mediastinal, hilar, or axillary lymph nodes. Thyroid gland, trachea, and esophagus demonstrate no significant findings. Lungs/Pleura: Postsurgical changes from left pneumonectomy, with fluid filling the left chest cavity. Diffuse right upper lobe airspace disease unchanged since previous chest x-ray. Hypoventilatory changes are seen within the right lower lobe. No right effusion or pneumothorax. Upper Abdomen: No acute abnormality. Musculoskeletal: No acute or destructive bony abnormalities. Chronic wedge compression deformities at the thoracolumbar junction. Reconstructed images demonstrate no additional findings. IMPRESSION: 1. Diffuse right upper lobe ground-glass airspace disease, consistent with pneumonia. 2. Left pneumonectomy. 3. 4 cm ascending thoracic aortic aneurysm. Recommend annual imaging followup by CTA or MRA. This recommendation follows 2010 ACCF/AHA/AATS/ACR/ASA/SCA/SCAI/SIR/STS/SVM Guidelines for the Diagnosis and Management of Patients with Thoracic Aortic Disease. Circulation. 2010; 121:: [id number] Aortic aneurysm NOS (ICD10-I71.9) 4.  Aortic Atherosclerosis (ICD10-I70.0). Electronically Signed   By: MRanda NgoM.D.   On: 05/30/2020 04:08   DG Chest Port 1 View  Result Date:  05/31/2020 CLINICAL DATA:  Status post left pneumonectomy. Shortness of breath. EXAM: PORTABLE CHEST 1 VIEW COMPARISON:  05/30/2020 FINDINGS: Postoperative changes from left pneumonectomy. There is complete opacification of the left hemithorax, unchanged. Diffuse interstitial opacities within the right upper lobe appear unchanged from the previous study. No new findings. IMPRESSION: 1. No change in aeration to the right upper lobe compared with previous exam. 2. Stable postoperative changes from left pneumonectomy. Electronically Signed   By: Kerby Moors M.D.   On: 05/31/2020 10:33   DG Chest Port 1 View  Result Date: 05/30/2020 CLINICAL DATA:  Shortness of breath EXAM: PORTABLE CHEST 1 VIEW COMPARISON:  05/29/2020 FINDINGS: Postsurgical changes are noted on the left consistent with pneumonectomy. Right lung is well aerated with diffuse upper lobe airspace opacity stable in appearance from the prior exam. No new focal abnormality is seen. IMPRESSION: Stable changes from the previous day. Electronically Signed   By: Inez Catalina M.D.   On: 05/30/2020 08:48   DG Chest Port 1 View  Result Date: 05/29/2020 CLINICAL DATA:  Shortness of breath.  COVID 19 positive. EXAM: PORTABLE CHEST 1 VIEW COMPARISON:  05/28/2020 FINDINGS: Sequelae of left pneumonectomy are again identified with unchanged opacification of the left hemithorax. Right hilar prominence is unchanged. There is persistent airspace opacity throughout the right upper lobe without significant interval change. Mild right basilar opacity has decreased. No sizable right-sided pleural effusion or pneumothorax is identified. IMPRESSION: 1. Unchanged right upper lobe pneumonia. 2. Improved right basilar aeration. Electronically Signed   By: Logan Bores M.D.   On: 05/29/2020 08:17   DG Chest Port 1 View  Result Date: 05/28/2020 CLINICAL DATA:  Shortness of breath, COVID-19 positive. EXAM: PORTABLE CHEST 1 VIEW COMPARISON:  May 27, 2020. FINDINGS: Status  post left pneumonectomy. No pneumothorax is noted stable right upper lobe airspace opacity is noted consistent with pneumonia. Mildly increased right basilar opacity is noted concerning for worsening pneumonia. Bony thorax is unremarkable. IMPRESSION: Stable right upper lobe airspace opacity is noted consistent with pneumonia. Mildly increased right basilar opacity is noted concerning for worsening pneumonia. Electronically Signed   By: Marijo Conception M.D.   On: 05/28/2020 09:03   DG Chest Port 1 View  Result Date: 05/27/2020 CLINICAL DATA:  Shortness of breath, pneumonia, COVID-19 EXAM: PORTABLE CHEST 1 VIEW COMPARISON:  Portable exam 0649 hours compared to 05/25/2020 FINDINGS: Persistent complete opacification of the LEFT hemithorax postpneumonectomy. Increased RIGHT upper lobe infiltrate consistent with pneumonia. Prominence of RIGHT hilum unchanged. Minimal atelectasis at RIGHT base. No pneumothorax. Bones demineralized. IMPRESSION: Increased RIGHT upper lobe infiltrate consistent with pneumonia. Post LEFT pneumonectomy. Electronically Signed   By: Lavonia Dana M.D.   On: 05/27/2020 09:14   VAS [place] LOWER EXTREMITY VENOUS (DVT)  Result Date: 05/27/2020  Lower Venous DVTStudy Indications: Edema, Swelling, and Rapidly rising D-dimer.  Comparison Study: no prior Performing Technologist: Abram Sander RVS  Examination Guidelines: A complete evaluation includes B-mode imaging, spectral Doppler, color Doppler, and power Doppler as needed of all accessible portions of each vessel. Bilateral testing is considered an integral part of a complete examination. Limited examinations for reoccurring indications may be performed as noted. The reflux portion of the exam is performed with the patient in reverse Trendelenburg.  +---------+---------------+---------+-----------+----------+--------------+ RIGHT    CompressibilityPhasicitySpontaneityPropertiesThrombus Aging  +---------+---------------+---------+-----------+----------+--------------+ CFV      Full           Yes      Yes                                 +---------+---------------+---------+-----------+----------+--------------+  SFJ      Full                                                        +---------+---------------+---------+-----------+----------+--------------+ FV Prox  Full                                                        +---------+---------------+---------+-----------+----------+--------------+ FV Mid   Full                                                        +---------+---------------+---------+-----------+----------+--------------+ FV DistalFull                                                        +---------+---------------+---------+-----------+----------+--------------+ PFV      Full                                                        +---------+---------------+---------+-----------+----------+--------------+ POP      Full           Yes      Yes                                 +---------+---------------+---------+-----------+----------+--------------+ PTV      Full                                                        +---------+---------------+---------+-----------+----------+--------------+ PERO     Full                                                        +---------+---------------+---------+-----------+----------+--------------+   +---------+---------------+---------+-----------+----------+--------------+ LEFT     CompressibilityPhasicitySpontaneityPropertiesThrombus Aging +---------+---------------+---------+-----------+----------+--------------+ CFV      Full           Yes      Yes                                 +---------+---------------+---------+-----------+----------+--------------+ SFJ      Full                                                         +---------+---------------+---------+-----------+----------+--------------+  FV Prox  Full                                                        +---------+---------------+---------+-----------+----------+--------------+ FV Mid   Full                                                        +---------+---------------+---------+-----------+----------+--------------+ FV DistalFull                                                        +---------+---------------+---------+-----------+----------+--------------+ PFV      Full                                                        +---------+---------------+---------+-----------+----------+--------------+ POP      Full           Yes      Yes                                 +---------+---------------+---------+-----------+----------+--------------+ PTV      Full                                                        +---------+---------------+---------+-----------+----------+--------------+ PERO                                                  Not visualized +---------+---------------+---------+-----------+----------+--------------+     Summary: BILATERAL: - No evidence of deep vein thrombosis seen in the lower extremities, bilaterally. - No evidence of superficial venous thrombosis in the lower extremities, bilaterally. -   *See table(s) above for measurements and observations. Electronically signed by Servando Snare MD on 05/27/2020 at 5:30:45 PM.    Final

## 2020-06-02 ENCOUNTER — Inpatient Hospital Stay (HOSPITAL_COMMUNITY): Payer: Medicare Other

## 2020-06-02 LAB — CBC WITH DIFFERENTIAL/PLATELET
Abs Immature Granulocytes: 0.13 10*3/uL — ABNORMAL HIGH (ref 0.00–0.07)
Basophils Absolute: 0 10*3/uL (ref 0.0–0.1)
Basophils Relative: 0 %
Eosinophils Absolute: 0 10*3/uL (ref 0.0–0.5)
Eosinophils Relative: 0 %
HCT: 37.8 % — ABNORMAL LOW (ref 39.0–52.0)
Hemoglobin: 11.2 g/dL — ABNORMAL LOW (ref 13.0–17.0)
Immature Granulocytes: 1 %
Lymphocytes Relative: 6 %
Lymphs Abs: 0.6 10*3/uL — ABNORMAL LOW (ref 0.7–4.0)
MCH: 28.4 pg (ref 26.0–34.0)
MCHC: 29.6 g/dL — ABNORMAL LOW (ref 30.0–36.0)
MCV: 95.9 fL (ref 80.0–100.0)
Monocytes Absolute: 0.5 10*3/uL (ref 0.1–1.0)
Monocytes Relative: 5 %
Neutro Abs: 9.2 10*3/uL — ABNORMAL HIGH (ref 1.7–7.7)
Neutrophils Relative %: 88 %
Platelets: 332 10*3/uL (ref 150–400)
RBC: 3.94 MIL/uL — ABNORMAL LOW (ref 4.22–5.81)
RDW: 15.8 % — ABNORMAL HIGH (ref 11.5–15.5)
WBC: 10.4 10*3/uL (ref 4.0–10.5)
nRBC: 0 % (ref 0.0–0.2)

## 2020-06-02 LAB — COMPREHENSIVE METABOLIC PANEL
ALT: 23 U/L (ref 0–44)
AST: 30 U/L (ref 15–41)
Albumin: 2.5 g/dL — ABNORMAL LOW (ref 3.5–5.0)
Alkaline Phosphatase: 88 U/L (ref 38–126)
Anion gap: 7 (ref 5–15)
BUN: 43 mg/dL — ABNORMAL HIGH (ref 8–23)
CO2: 35 mmol/L — ABNORMAL HIGH (ref 22–32)
Calcium: 8.8 mg/dL — ABNORMAL LOW (ref 8.9–10.3)
Chloride: 106 mmol/L (ref 98–111)
Creatinine, Ser: 1.21 mg/dL (ref 0.61–1.24)
GFR calc Af Amer: >60 mL/min (ref >60)
GFR calc non Af Amer: 52 mL/min — ABNORMAL LOW (ref >60)
Glucose, Bld: 147 mg/dL — ABNORMAL HIGH (ref 70–99)
Potassium: 4.3 mmol/L (ref 3.5–5.1)
Sodium: 148 mmol/L — ABNORMAL HIGH (ref 135–145)
Total Bilirubin: 0.8 mg/dL (ref 0.3–1.2)
Total Protein: 7.2 g/dL (ref 6.5–8.1)

## 2020-06-02 LAB — C-REACTIVE PROTEIN: CRP: 12.5 mg/dL — ABNORMAL HIGH (ref <1.0)

## 2020-06-02 LAB — D-DIMER, QUANTITATIVE: D-Dimer, Quant: 3 ug/mL-FEU — ABNORMAL HIGH (ref 0.00–0.50)

## 2020-06-02 LAB — MAGNESIUM: Magnesium: 2.6 mg/dL — ABNORMAL HIGH (ref 1.7–2.4)

## 2020-06-02 LAB — BRAIN NATRIURETIC PEPTIDE: B Natriuretic Peptide: 128.8 pg/mL — ABNORMAL HIGH (ref 0.0–100.0)

## 2020-06-02 LAB — PROCALCITONIN: Procalcitonin: <0.1 ng/mL

## 2020-06-02 MED ORDER — LACTATED RINGERS IV BOLUS
500.0000 mL | Freq: Once | INTRAVENOUS | Status: AC
  Administered 2020-06-02: 500 mL via INTRAVENOUS

## 2020-06-02 MED ORDER — SODIUM CHLORIDE 0.9 % IV SOLN
3.0000 g | Freq: Three times a day (TID) | INTRAVENOUS | Status: DC
  Administered 2020-06-02 – 2020-06-03 (×5): 3 g via INTRAVENOUS
  Filled 2020-06-02 (×4): qty 8
  Filled 2020-06-02: qty 3
  Filled 2020-06-02 (×3): qty 8

## 2020-06-02 MED ORDER — METOPROLOL TARTRATE 50 MG PO TABS: 50.0000 mg | ORAL_TABLET | Freq: Two times a day (BID) | ORAL | Status: DC

## 2020-06-02 MED ORDER — LACTATED RINGERS IV SOLN: INTRAVENOUS | Status: DC

## 2020-06-02 MED ORDER — ACETAMINOPHEN 650 MG RE SUPP
650.0000 mg | Freq: Four times a day (QID) | RECTAL | Status: DC | PRN
  Administered 2020-06-02: 650 mg via RECTAL
  Filled 2020-06-02 (×2): qty 1

## 2020-06-02 MED ORDER — DEXAMETHASONE SODIUM PHOSPHATE 10 MG/ML IJ SOLN: 8.0000 mg | INTRAMUSCULAR | Status: DC

## 2020-06-02 NOTE — Progress Notes (Signed)
PROGRESS NOTE                                                                                                                                                                                                             Patient Demographics:    Craig Chapman, is a 86 y.o. male, DOB - December 03, 1927, YQM:[id number]  Outpatient Primary MD for the patient is Lavone Orn, MD    LOS - 7  Admit date - 05/27/2020    Chief Complaint  Patient presents with   Altered Mental Status       Brief Narrative  Craig Chapman is a 86 y.o. male with medical history significant of HLD, paroxysmal A. Fib, remote history of lymphoma, remote history of lung cancer status post left pneumonectomy, presented with confusion and short of breath, he was diagnosed with acute hypoxic respiratory failure due to COVID-19 pneumonia and admitted to the hospital.  Unfortunately patient was not able to get the vaccine so far.   Subjective:   Patient in bed this morning more short of breath, unable to answer questions reliably today appears more lethargic and confused.   Assessment  & Plan :     1. Acute Hypoxic Resp. Failure due to Acute Covid 19 Viral Pneumonitis during the ongoing 2020 Covid 19 Pandemic -he had moderate disease but quickly is progressing towards extremely severe parenchymal lung injury, on high-dose IV steroids and remdesivir along with as needed Lasix repeated IV 05/29/20, 05/30/20, currently on 5- 8 L nasal cannula oxygen.  Family has been informed that if worse and suffering we will focus on keeping him comfortable.  He is DNR.  Prognosis again appears poor.  Encouraged the patient to sit up in chair in the daytime when possible and use I-S and flutter valve for pulmonary toiletry and then prone in bed when at night.  Will advance activity and titrate down oxygen as possible.     Recent Labs  Lab 05/31/2020 1149 06/03/2020 1200  05/28/20 0321 05/29/20 0336 05/31/20 0653 06/01/20 0353 06/02/20 0402  CRP  --    < > 6.7* 11.5* 8.7* 8.4* 12.5*  DDIMER  --    < > 2.82* 2.81* 4.00* 3.15* 3.00*  BNP  --    < > 236.1* 283.0* 132.0* 131.0* 128.8*  PROCALCITON  --    < > <  0.10 <0.10 <0.10 <0.10 <0.10  SARSCOV2NAA POSITIVE*  --   --   --   --   --   --    < > = values in this interval not displayed.    Hepatic Function Latest Ref Rng & Units 06/02/2020 06/01/2020 05/31/2020  Total Protein 6.5 - 8.1 g/dL 7.2 7.1 7.1  Albumin 3.5 - 5.0 g/dL 2.5(L) 2.5(L) 2.6(L)  AST 15 - 41 U/L 30 37 42(H)  ALT 0 - 44 U/L '23 26 27  '$ Alk Phosphatase 38 - 126 U/L 88 77 77  Total Bilirubin 0.3 - 1.2 mg/dL 0.8 1.1 0.9    2.  Aspiration pneumonia on 06/02/2020.  Unfortunately this happened most likely yesterday when one of the family members was force-feeding patient despite nurse warning, patient was visibly coughing during this episode, chest x-ray shows new right-sided infiltrate, he has coarse right-sided breath sounds.  Unfortunately with COVID-19 pneumonia, his advanced age and a new aspiration pneumonia his prognosis is severely declined.  Will place him on Unasyn, speech to see, continue soft diet for now with feeding assistance and aspiration precautions if patient alert enough to eat.    3.  Anemia with evidence of some iron deficiency.  Placed on oral iron, continue PPI, outpatient age-appropriate anemia work-up per DCP.  Currently not a candidate for EGD or colonoscopy.  4.  Dyslipidemia on statin.  5.  Hypertension.  As needed hydralazine and monitor.  6.  Toxic encephalopathy.  Due to Covid infection.  Haldol, minimize narcotics and benzodiazepines.  Supportive care.  Family has already been warned that this might happen on 05/27/2020 and reaffirmed on 05/28/2020.  7. Hypokalemia - replaced.  8. History of lung cancer with questionable right hilar mass.  History of  left-sided lung lobectomy, outpatient follow-up with PCP and  pulmonologist post discharge.  Does not appear to be a candidate for invasive testing or procedures.  9. Rising D-dimer.  Due to intense inflammation from COVID-19, initial leg ultrasound is negative, will increase Lovenox dose and monitor closely.  Lab Results  Component Value Date   DDIMER 3.00 (H) 06/02/2020     Condition - Extremely Guarded  Family Communication  :  Craig Chapman [phone] on 05/27/2020, 05/28/20, 05/29/20, 05/30/20, 05/31/20, 06/01/20  Code Status : DNR  Consults  : None  Procedures  :    CTA - 1. Diffuse right upper lobe ground-glass airspace disease, consistent with pneumonia. 2. Left pneumonectomy. 3. 4 cm ascending thoracic aortic aneurysm. Recommend annual imaging followup by CTA or MRA. This recommendation follows 2010 ACCF/AHA/AATS/ACR/ASA/SCA/SCAI/SIR/STS/SVM Guidelines for the Diagnosis and Management of Patients with Thoracic Aortic Disease. Circulation. 2010; 121: E268-T419. Aortic aneurysm NOS (ICD10-I71.9) 4.  Aortic Atherosclerosis (ICD10-I70.0)  Leg [place] - No DVT   PUD Prophylaxis : PPI  Disposition Plan  :    Status is: Inpatient  Remains inpatient appropriate because:IV treatments appropriate due to intensity of illness or inability to take PO   Dispo: The patient is from: Home              Anticipated d/c is to: Home              Anticipated d/c date is: > 3 days              Patient currently is not medically stable to d/c.   DVT Prophylaxis  :  Lovenox    Lab Results  Component Value Date   PLT 332 06/02/2020    Diet :  Diet Order            DIET SOFT Room service appropriate? Yes; Fluid consistency: Thin  Diet effective now                  Inpatient Medications  Scheduled Meds:  aspirin EC  81 mg Oral Daily   atorvastatin  40 mg Oral q1800   dexamethasone (DECADRON) injection  20 mg Intravenous Q24H   docusate sodium  200 mg Oral BID   enoxaparin (LOVENOX) injection  60 mg Subcutaneous Q12H   ferrous sulfate   325 mg Oral BID WC   Ipratropium-Albuterol  1 puff Inhalation Q6H   mouth rinse  15 mL Mouth Rinse BID   metoprolol tartrate  50 mg Oral BID   pantoprazole  40 mg Oral Daily   Continuous Infusions:  lactated ringers     PRN Meds:.acetaminophen, guaiFENesin-dextromethorphan, haloperidol lactate, hydrALAZINE, senna-docusate  Antibiotics  :    Anti-infectives (From admission, onward)   Start     Dose/Rate Route Frequency Ordered Stop   05/27/20 1000  remdesivir 100 mg in sodium chloride 0.9 % 100 mL IVPB       "Followed by" Linked Group Details   100 mg 200 mL/hr over 30 Minutes Intravenous Daily 05/28/2020 1348 05/30/20 1030   05/17/2020 1400  remdesivir 200 mg in sodium chloride 0.9% 250 mL IVPB       "Followed by" Linked Group Details   200 mg 580 mL/hr over 30 Minutes Intravenous Once 05/09/2020 1348 05/31/2020 1715       Time Spent in minutes  30   Lala Lund M.D on 06/02/2020 at 9:28 AM  To page go to [email or web address] - password [facility]  [facility] -  Office  [phone]  See all Orders from today for further details    Objective:   Vitals:   06/01/20 2000 06/02/20 0303 06/02/20 0354 06/02/20 0909  BP: (!) 125/60 104/73  109/75  Pulse: 74 87 82   Resp: '21 22 22 '$ (!) 29  Temp: 98.2 F (36.8 C) 98 F (36.7 C)  (!) 101.1 F (38.4 C)  TempSrc: Axillary Axillary  Axillary  SpO2: 91% 93% 95% 91%  Weight:   90.4 kg   Height:        Wt Readings from Last 3 Encounters:  06/02/20 90.4 kg  01/17/20 103.8 kg  09/19/19 101.2 kg     Intake/Output Summary (Last 24 hours) at 06/02/2020 0928 Last data filed at 06/02/2020 0800 Gross per 24 hour  Intake 366.44 ml  Output 700 ml  Net -333.56 ml     Physical Exam  Awake appears more lethargic and short of breath this morning, [place].AT,PERRAL Supple Neck,No JVD, No cervical lymphadenopathy appriciated.  Symmetrical Chest wall movement, coarse right-sided breath sounds RRR,No Gallops, Rubs or new Murmurs, No  Parasternal Heave +ve B.Sounds, Abd Soft, No tenderness, No organomegaly appriciated, No rebound - guarding or rigidity. No Cyanosis, Clubbing or edema      Data Review:    CBC Recent Labs  Lab 05/28/20 0321 05/29/20 0336 05/31/20 0653 06/01/20 0353 06/02/20 0402  WBC 5.0 8.1 8.4 10.5 10.4  HGB 10.4* 10.7* 11.1* 10.2* 11.2*  HCT 33.7* 33.3* 35.4* 33.3* 37.8*  PLT 201 244 329 341 332  MCV 92.6 91.2 92.9 93.3 95.9  MCH 28.6 29.3 29.1 28.6 28.4  MCHC 30.9 32.1 31.4 30.6 29.6*  RDW 15.3 15.2 15.6* 15.6* 15.8*  LYMPHSABS 0.9 0.8 0.3* 0.9 0.6*  MONOABS 0.4 0.5  0.1 0.5 0.5  EOSABS 0.0 0.0 0.0 0.1 0.0  BASOSABS 0.0 0.0 0.0 0.1 0.0    Chemistries  Recent Labs  Lab 05/18/2020 1200 06/07/2020 1332 05/28/20 0321 05/29/20 0336 05/31/20 0653 06/01/20 0353 06/02/20 0402  NA 136   < > 137 137 141 143 148*  K 3.3*   < > 3.9 3.2* 3.6 4.4 4.3  CL 105   < > 103 101 102 105 106  CO2 22   < > '24 26 28 29 '$ 35*  GLUCOSE 116*   < > 121* 133* 121* 130* 147*  BUN 18   < > 18 20 39* 40* 43*  CREATININE 0.93   < > 0.80 0.98 0.89 0.90 1.21  CALCIUM 7.5*   < > 8.5* 8.2* 8.8* 8.7* 8.8*  AST 55*   < > 59* 62* 42* 37 30  ALT 25   < > '27 30 27 26 23  '$ ALKPHOS 63   < > 69 74 77 77 88  BILITOT 0.8   < > 0.7 1.0 0.9 1.1 0.8  MG  --    < > 1.9 2.0 2.3 2.4 2.6*  INR 1.5*  --   --   --   --   --   --    < > = values in this interval not displayed.     ------------------------------------------------------------------------------------------------------------------ No results for input(s): CHOL, HDL, LDLCALC, TRIG, CHOLHDL, LDLDIRECT in the last 72 hours.  Lab Results  Component Value Date   HGBA1C 5.4 08/16/2019   ------------------------------------------------------------------------------------------------------------------ No results for input(s): TSH, T4TOTAL, T3FREE, THYROIDAB in the last 72 hours.  Invalid input(s): FREET3  Cardiac Enzymes No results for input(s): CKMB, TROPONINI,  MYOGLOBIN in the last 168 hours.  Invalid input(s): CK ------------------------------------------------------------------------------------------------------------------    Component Value Date/Time   BNP 128.8 (H) 06/02/2020 0402    Micro Results Recent Results (from the past 240 hour(s))  Blood culture (routine x 2)     Status: None   Collection Time: 06/06/2020 11:41 AM   Specimen: BLOOD RIGHT FOREARM  Result Value Ref Range Status   Specimen Description BLOOD RIGHT FOREARM  Final   Special Requests   Final    BOTTLES DRAWN AEROBIC AND ANAEROBIC Blood Culture adequate volume   Culture   Final    NO GROWTH 5 DAYS Performed at [facility], [address] [place], [place] [address]    Report Status 05/31/2020 FINAL  Final  Blood culture (routine x 2)     Status: None   Collection Time: 06/02/2020 11:46 AM   Specimen: BLOOD RIGHT HAND  Result Value Ref Range Status   Specimen Description BLOOD RIGHT HAND  Final   Special Requests   Final    BOTTLES DRAWN AEROBIC AND ANAEROBIC Blood Culture results may not be optimal due to an inadequate volume of blood received in culture bottles   Culture   Final    NO GROWTH 5 DAYS Performed at [facility], [place] [address], [place],  [address]    Report Status 05/31/2020 FINAL  Final  SARS Coronavirus 2 by RT PCR (hospital order, performed in [facility] hospital lab) Nasopharyngeal Nasopharyngeal Swab     Status: Abnormal   Collection Time: 05/17/2020 11:49 AM   Specimen: Nasopharyngeal Swab  Result Value Ref Range Status   SARS Coronavirus 2 POSITIVE (A) NEGATIVE Final    Comment: RESULT CALLED TO, READ BACK BY AND VERIFIED WITH: RN Acquanetta Chain AT 1339 05/20/2020  BY L BENFIELD (NOTE) SARS-CoV-2 target nucleic acids are DETECTED  SARS-CoV-2 RNA is generally detectable in upper respiratory specimens  during the acute phase of infection.  Positive results are indicative  of the presence of the identified virus, but do not  rule out bacterial infection or co-infection with other pathogens not detected by the test.  Clinical correlation with patient history and  other diagnostic information is necessary to determine patient infection status.  The expected result is negative.  Fact Sheet for Patients:   StrictlyIdeas.no   Fact Sheet for Healthcare Providers:   BankingDealers.co.za    This test is not yet approved or cleared by the [place] FDA and  has been authorized for detection and/or diagnosis of SARS-CoV-2 by FDA under an Emergency Use Authorization (EUA).  This EUA will remain in effect (meanin g this test can be used) for the duration of  the COVID-19 declaration under Section 564(b)(1) of the Act, 21 U.S.C. section 360-bbb-3(b)(1), unless the authorization is terminated or revoked sooner.  Performed at [facility], [place] [address] [place], [place] [address]     Radiology Reports DG Chest 1 View  Result Date: 05/18/2020 CLINICAL DATA:  Altered mental status, recent UTI, history of lung cancer, lymphoma, hypertension, atrial fibrillation EXAM: CHEST  1 VIEW COMPARISON:  08/21/2019 FINDINGS: Post LEFT pneumonectomy with persistent opacification of LEFT hemithorax. Atherosclerotic calcification aorta. Perihilar infiltrates identified in RIGHT upper lobe consistent with pneumonia. Enlargement of RIGHT pulmonary hilum, could reflect prominent vascular structure, adenopathy, mass, or artifact from slight rotation to the LEFT. No pneumothorax. Bones demineralized with chronic RIGHT rotator cuff tear. IMPRESSION: Postsurgical changes LEFT pneumonectomy. New RIGHT upper lobe infiltrate consistent with pneumonia. Question RIGHT hilar mass/adenopathy versus artifact from rotation. Electronically Signed   By: Lavonia Dana M.D.   On: 05/22/2020 12:49   CT Head Wo Contrast  Result Date: 05/20/2020 CLINICAL DATA:  Altered mental status EXAM: CT HEAD WITHOUT  CONTRAST TECHNIQUE: Contiguous axial images were obtained from the base of the skull through the vertex without intravenous contrast. COMPARISON:  08/16/2019 FINDINGS: Brain: Atrophic changes and scattered lacunar infarcts and chronic white matter ischemic change are seen and relatively stable from the prior exam. No findings to suggest acute hemorrhage, acute infarction or space-occupying mass lesion are noted. Vascular: No hyperdense vessel or unexpected calcification. Skull: Normal. Negative for fracture or focal lesion. Sinuses/Orbits: No acute finding. Other: None. IMPRESSION: Atrophic and ischemic changes without acute abnormality. Electronically Signed   By: Inez Catalina M.D.   On: 05/31/2020 12:52   CT CHEST W CONTRAST  Result Date: 05/30/2020 CLINICAL DATA:  Pneumonia EXAM: CT CHEST WITH CONTRAST TECHNIQUE: Multidetector CT imaging of the chest was performed during intravenous contrast administration. CONTRAST:  83m OMNIPAQUE IOHEXOL 300 MG/ML  SOLN COMPARISON:  05/29/2020 FINDINGS: Cardiovascular: Heart is unremarkable without pericardial effusion. Mild dilatation of the ascending thoracic aorta measuring 4 cm in diameter. Moderate atherosclerosis. Mediastinum/Nodes: No enlarged mediastinal, hilar, or axillary lymph nodes. Thyroid gland, trachea, and esophagus demonstrate no significant findings. Lungs/Pleura: Postsurgical changes from left pneumonectomy, with fluid filling the left chest cavity. Diffuse right upper lobe airspace disease unchanged since previous chest x-ray. Hypoventilatory changes are seen within the right lower lobe. No right effusion or pneumothorax. Upper Abdomen: No acute abnormality. Musculoskeletal: No acute or destructive bony abnormalities. Chronic wedge compression deformities at the thoracolumbar junction. Reconstructed images demonstrate no additional findings. IMPRESSION: 1. Diffuse right upper lobe ground-glass airspace disease, consistent with pneumonia. 2. Left  pneumonectomy. 3. 4 cm ascending thoracic aortic aneurysm. Recommend annual imaging followup by CTA or MRA. This recommendation follows 2010 ACCF/AHA/AATS/ACR/ASA/SCA/SCAI/SIR/STS/SVM Guidelines for the Diagnosis and Management of Patients with Thoracic Aortic Disease. Circulation. 2010; 121: B147-W295. Aortic aneurysm NOS (ICD10-I71.9) 4.  Aortic Atherosclerosis (ICD10-I70.0). Electronically Signed   By: Randa Ngo M.D.   On: 05/30/2020 04:08   DG Chest Port 1 View  Result Date: 06/02/2020 CLINICAL DATA:  Shortness of breath and lethargy EXAM: PORTABLE CHEST 1 VIEW COMPARISON:  May 31, 2020 chest radiograph and chest CT May 30, 2020 FINDINGS: Status post pneumonectomy on the left with shift of heart and mediastinum toward the left. There is focal airspace opacity in the right upper lobe abutting the minor fissure which is significantly more consolidated compared to 2 days prior. Ill-defined opacity noted in the right base, similar to 2 days prior. There is less ill-defined airspace opacity in the right upper lobe compared to 2 days prior. Cardiac silhouette visualization limited due to opacity on the left from prior pneumonectomy. No gross change in cardiac silhouette. No adenopathy appreciable in areas that can be assessed for potential adenopathy by radiography. Postoperative change noted on the left. There is aortic atherosclerosis. IMPRESSION: 1. In comparison with chest radiograph obtained 2 days prior, there is now a focal well-defined consolidation in the right upper lobe adjacent to the minor fissure. More ill-defined airspace opacity throughout the right lung has cleared considerably compared to 2 days prior. Ill-defined opacity in the right base is stable. 2. Stable changes from prior pneumonectomy on the left. No gross change in cardiac silhouette. 3.  Aortic Atherosclerosis (ICD10-I70.0). Electronically Signed   By: Lowella Grip III M.D.   On: 06/02/2020 09:13   DG Chest Port 1  View  Result Date: 05/31/2020 CLINICAL DATA:  Status post left pneumonectomy. Shortness of breath. EXAM: PORTABLE CHEST 1 VIEW COMPARISON:  05/30/2020 FINDINGS: Postoperative changes from left pneumonectomy. There is complete opacification of the left hemithorax, unchanged. Diffuse interstitial opacities within the right upper lobe appear unchanged from the previous study. No new findings. IMPRESSION: 1. No change in aeration to the right upper lobe compared with previous exam. 2. Stable postoperative changes from left pneumonectomy. Electronically Signed   By: Kerby Moors M.D.   On: 05/31/2020 10:33   DG Chest Port 1 View  Result Date: 05/30/2020 CLINICAL DATA:  Shortness of breath EXAM: PORTABLE CHEST 1 VIEW COMPARISON:  05/29/2020 FINDINGS: Postsurgical changes are noted on the left consistent with pneumonectomy. Right lung is well aerated with diffuse upper lobe airspace opacity stable in appearance from the prior exam. No new focal abnormality is seen. IMPRESSION: Stable changes from the previous day. Electronically Signed   By: Inez Catalina M.D.   On: 05/30/2020 08:48   DG Chest Port 1 View  Result Date: 05/29/2020 CLINICAL DATA:  Shortness of breath.  COVID 19 positive. EXAM: PORTABLE CHEST 1 VIEW COMPARISON:  05/28/2020 FINDINGS: Sequelae of left pneumonectomy are again identified with unchanged opacification of the left hemithorax. Right hilar prominence is unchanged. There is persistent airspace opacity throughout the right upper lobe without significant interval change. Mild right basilar opacity has decreased. No sizable right-sided pleural effusion or pneumothorax is identified. IMPRESSION: 1. Unchanged right upper lobe pneumonia. 2. Improved right basilar aeration. Electronically Signed   By: Logan Bores M.D.   On: 05/29/2020 08:17   DG Chest Port 1 View  Result Date: 05/28/2020 CLINICAL DATA:  Shortness of breath, COVID-19 positive. EXAM: PORTABLE CHEST  1 VIEW COMPARISON:  May 27, 2020. FINDINGS: Status post left pneumonectomy. No pneumothorax is noted stable right upper lobe airspace opacity is noted consistent with pneumonia. Mildly increased right basilar opacity is noted concerning for worsening pneumonia. Bony thorax is unremarkable. IMPRESSION: Stable right upper lobe airspace opacity is noted consistent with pneumonia. Mildly increased right basilar opacity is noted concerning for worsening pneumonia. Electronically Signed   By: Marijo Conception M.D.   On: 05/28/2020 09:03   DG Chest Port 1 View  Result Date: 05/27/2020 CLINICAL DATA:  Shortness of breath, pneumonia, COVID-19 EXAM: PORTABLE CHEST 1 VIEW COMPARISON:  Portable exam 0649 hours compared to 05/08/2020 FINDINGS: Persistent complete opacification of the LEFT hemithorax postpneumonectomy. Increased RIGHT upper lobe infiltrate consistent with pneumonia. Prominence of RIGHT hilum unchanged. Minimal atelectasis at RIGHT base. No pneumothorax. Bones demineralized. IMPRESSION: Increased RIGHT upper lobe infiltrate consistent with pneumonia. Post LEFT pneumonectomy. Electronically Signed   By: Lavonia Dana M.D.   On: 05/27/2020 09:14   VAS [place] LOWER EXTREMITY VENOUS (DVT)  Result Date: 05/27/2020  Lower Venous DVTStudy Indications: Edema, Swelling, and Rapidly rising D-dimer.  Comparison Study: no prior Performing Technologist: Abram Sander RVS  Examination Guidelines: A complete evaluation includes B-mode imaging, spectral Doppler, color Doppler, and power Doppler as needed of all accessible portions of each vessel. Bilateral testing is considered an integral part of a complete examination. Limited examinations for reoccurring indications may be performed as noted. The reflux portion of the exam is performed with the patient in reverse Trendelenburg.  +---------+---------------+---------+-----------+----------+--------------+  RIGHT     Compressibility Phasicity Spontaneity Properties Thrombus Aging   +---------+---------------+---------+-----------+----------+--------------+  CFV       Full            Yes       Yes                                    +---------+---------------+---------+-----------+----------+--------------+  SFJ       Full                                                             +---------+---------------+---------+-----------+----------+--------------+  FV Prox   Full                                                             +---------+---------------+---------+-----------+----------+--------------+  FV Mid    Full                                                             +---------+---------------+---------+-----------+----------+--------------+  FV Distal Full                                                             +---------+---------------+---------+-----------+----------+--------------+  PFV       Full                                                             +---------+---------------+---------+-----------+----------+--------------+  POP       Full            Yes       Yes                                    +---------+---------------+---------+-----------+----------+--------------+  PTV       Full                                                             +---------+---------------+---------+-----------+----------+--------------+  PERO      Full                                                             +---------+---------------+---------+-----------+----------+--------------+   +---------+---------------+---------+-----------+----------+--------------+  LEFT      Compressibility Phasicity Spontaneity Properties Thrombus Aging  +---------+---------------+---------+-----------+----------+--------------+  CFV       Full            Yes       Yes                                    +---------+---------------+---------+-----------+----------+--------------+  SFJ       Full                                                              +---------+---------------+---------+-----------+----------+--------------+  FV Prox   Full                                                             +---------+---------------+---------+-----------+----------+--------------+  FV Mid    Full                                                             +---------+---------------+---------+-----------+----------+--------------+  FV Distal Full                                                             +---------+---------------+---------+-----------+----------+--------------+  PFV       Full                                                             +---------+---------------+---------+-----------+----------+--------------+  POP       Full            Yes       Yes                                    +---------+---------------+---------+-----------+----------+--------------+  PTV       Full                                                             +---------+---------------+---------+-----------+----------+--------------+  PERO                                                       Not visualized  +---------+---------------+---------+-----------+----------+--------------+     Summary: BILATERAL: - No evidence of deep vein thrombosis seen in the lower extremities, bilaterally. - No evidence of superficial venous thrombosis in the lower extremities, bilaterally. -   *See table(s) above for measurements and observations. Electronically signed by Servando Snare MD on 05/27/2020 at 5:30:45 PM.    Final

## 2020-06-02 NOTE — Progress Notes (Signed)
SLP Cancellation Note  Patient Details Name: Craig Chapman MRN: [id number] DOB: Dec 30, 1927   Cancelled treatment:        RN stated pt was not adequately alert this afternoon when therapist came by to assess. Will continue efforts tomorrow.    Houston Siren 06/02/2020, 4:02 PM   Orbie Pyo Colvin Caroli.Ed Risk analyst [phone] Office [phone]

## 2020-06-02 NOTE — Progress Notes (Signed)
Pharmacy Antibiotic Note  Craig Chapman is a 86 y.o. male admitted on 06/03/2020 with pneumonia.  Pharmacy has been consulted for Unasyn dosing.  Pt has been here for COVID and has completed remdesivir. Pt had an episode of aspiration yesterday after feeding. He has developed a fever and a new positive CXR. Unasyn ordered for aspiration PNA.   CrCl~45 ml/min  Plan: Unasyn 3g IV q8 F/u cultures  Height: 6' (182.9 cm) Weight: 90.4 kg (199 lb 4.7 oz) IBW/kg (Calculated) : 77.6  Temp (24hrs), Avg:98.8 F (37.1 C), Min:98 F (36.7 C), Max:101.1 F (38.4 C)  Recent Labs  Lab 05/18/2020 1200 05/27/20 0130 05/28/20 0321 05/29/20 0336 05/31/20 0653 06/01/20 0353 06/02/20 0402  WBC 3.0*   < > 5.0 8.1 8.4 10.5 10.4  CREATININE 0.93   < > 0.80 0.98 0.89 0.90 1.21  LATICACIDVEN 1.1  --   --   --   --   --   --    < > = values in this interval not displayed.    Estimated Creatinine Clearance: 44.5 mL/min (by C-G formula based on SCr of 1.21 mg/dL).    Allergies  Allergen Reactions  . Sulfa Antibiotics Hives    Antimicrobials this admission: Remdesivir 7/19 >> 7/23 Dex 7/19 >>(7/29) Unasyn 7/26>>  Dose adjustments this admission:   Microbiology results: 7/19 COVID: positive 7/19 Bcx : ngF 7/26 blood>>  Onnie Boer, PharmD, [place], AAHIVP, CPP Infectious Disease Pharmacist 06/02/2020 9:48 AM

## 2020-06-02 NOTE — Progress Notes (Signed)
Patients grandson at bedside without proper PPE in place. He was educated regarding proper precautions that needed to be in place and taken to stay at the bedside as this is a safety issue for staff and any other visitors that may be in the hospital. Grandson verbalizes understanding, yet still does not take the proper precautions and don the proper PPE. Charge RN made aware and states that she would inform Unit management.

## 2020-06-03 LAB — CBC WITH DIFFERENTIAL/PLATELET
Abs Immature Granulocytes: 0 10*3/uL (ref 0.00–0.07)
Basophils Absolute: 0 10*3/uL (ref 0.0–0.1)
Basophils Relative: 0 %
Eosinophils Absolute: 0 10*3/uL (ref 0.0–0.5)
Eosinophils Relative: 0 %
HCT: 36.7 % — ABNORMAL LOW (ref 39.0–52.0)
Hemoglobin: 10.9 g/dL — ABNORMAL LOW (ref 13.0–17.0)
Lymphocytes Relative: 4 %
Lymphs Abs: 0.5 10*3/uL — ABNORMAL LOW (ref 0.7–4.0)
MCH: 28.9 pg (ref 26.0–34.0)
MCHC: 29.7 g/dL — ABNORMAL LOW (ref 30.0–36.0)
MCV: 97.3 fL (ref 80.0–100.0)
Monocytes Absolute: 1.3 10*3/uL — ABNORMAL HIGH (ref 0.1–1.0)
Monocytes Relative: 10 %
Neutro Abs: 11.5 10*3/uL — ABNORMAL HIGH (ref 1.7–7.7)
Neutrophils Relative %: 86 %
Platelets: 265 10*3/uL (ref 150–400)
RBC: 3.77 MIL/uL — ABNORMAL LOW (ref 4.22–5.81)
RDW: 16 % — ABNORMAL HIGH (ref 11.5–15.5)
WBC: 13.4 10*3/uL — ABNORMAL HIGH (ref 4.0–10.5)
nRBC: 0 % (ref 0.0–0.2)
nRBC: 0 /100 WBC

## 2020-06-03 LAB — MAGNESIUM: Magnesium: 2.5 mg/dL — ABNORMAL HIGH (ref 1.7–2.4)

## 2020-06-03 LAB — COMPREHENSIVE METABOLIC PANEL
ALT: 21 U/L (ref 0–44)
AST: 26 U/L (ref 15–41)
Albumin: 2.2 g/dL — ABNORMAL LOW (ref 3.5–5.0)
Alkaline Phosphatase: 70 U/L (ref 38–126)
Anion gap: 7 (ref 5–15)
BUN: 39 mg/dL — ABNORMAL HIGH (ref 8–23)
CO2: 33 mmol/L — ABNORMAL HIGH (ref 22–32)
Calcium: 8.7 mg/dL — ABNORMAL LOW (ref 8.9–10.3)
Chloride: 112 mmol/L — ABNORMAL HIGH (ref 98–111)
Creatinine, Ser: 1.09 mg/dL (ref 0.61–1.24)
GFR calc Af Amer: >60 mL/min (ref >60)
GFR calc non Af Amer: 59 mL/min — ABNORMAL LOW (ref >60)
Glucose, Bld: 133 mg/dL — ABNORMAL HIGH (ref 70–99)
Potassium: 3.8 mmol/L (ref 3.5–5.1)
Sodium: 152 mmol/L — ABNORMAL HIGH (ref 135–145)
Total Bilirubin: 0.9 mg/dL (ref 0.3–1.2)
Total Protein: 7.2 g/dL (ref 6.5–8.1)

## 2020-06-03 LAB — PROCALCITONIN: Procalcitonin: 0.51 ng/mL

## 2020-06-03 LAB — D-DIMER, QUANTITATIVE: D-Dimer, Quant: 2.71 ug/mL-FEU — ABNORMAL HIGH (ref 0.00–0.50)

## 2020-06-03 LAB — C-REACTIVE PROTEIN: CRP: 25.5 mg/dL — ABNORMAL HIGH (ref <1.0)

## 2020-06-03 LAB — BRAIN NATRIURETIC PEPTIDE: B Natriuretic Peptide: 160.2 pg/mL — ABNORMAL HIGH (ref 0.0–100.0)

## 2020-06-03 MED ORDER — DEXTROSE 5 % IV SOLN: INTRAVENOUS | Status: DC

## 2020-06-03 MED ORDER — DEXAMETHASONE SODIUM PHOSPHATE 4 MG/ML IJ SOLN
4.0000 mg | INTRAMUSCULAR | Status: DC
  Administered 2020-06-03: 4 mg via INTRAVENOUS
  Filled 2020-06-03: qty 1

## 2020-06-03 NOTE — Progress Notes (Signed)
PROGRESS NOTE                                                                                                                                                                                                             Patient Demographics:    Craig Chapman, is a 86 y.o. male, DOB - 1928/04/10, JQG:[id number]  Outpatient Primary MD for the patient is Lavone Orn, MD    LOS - 8  Admit date - 05/29/2020    Chief Complaint  Patient presents with  . Altered Mental Status       Brief Narrative  [name] is a 86 y.o. male with medical history significant of HLD, paroxysmal A. Fib, remote history of lymphoma, remote history of lung cancer status post left pneumonectomy, presented with confusion and short of breath, he was diagnosed with acute hypoxic respiratory failure due to COVID-19 pneumonia and admitted to the hospital.  Unfortunately patient was not able to get the vaccine so far.   Subjective:   Patient this morning in bed, no clothes, no mittens, bed alarm off, halfway down the bed almost about to fall, is confused and lethargic.  Apparently mittens were taken off and bed alarm was switched off by one of the family members who was in the room earlier.   Assessment  & Plan :     1. Acute Hypoxic Resp. Failure due to Acute Covid 19 Viral Pneumonitis during the ongoing 2020 Covid 19 Pandemic -he had moderate disease but quickly is progressing towards extremely severe parenchymal lung injury, on high-dose IV steroids and remdesivir along with as needed Lasix repeated IV 05/29/20, 05/30/20, currently on 5- 8 L nasal cannula oxygen.  Family has been informed that if worse and suffering we will focus on keeping him comfortable.  He is DNR.  Prognosis again appears poor.  Encouraged the patient to sit up in chair in the daytime when possible and use I-S and flutter valve for pulmonary toiletry and then prone in bed when at  night.  Will advance activity and titrate down oxygen as possible.   Recent Labs  Lab 05/29/20 0336 05/31/20 0653 06/01/20 0353 06/02/20 0402 06/03/20 0447  CRP 11.5* 8.7* 8.4* 12.5* 25.5*  DDIMER 2.81* 4.00* 3.15* 3.00* 2.71*  BNP 283.0* 132.0* 131.0* 128.8* 160.2*  PROCALCITON <0.10 <0.10 <0.10 <0.10 0.51  Hepatic Function Latest Ref Rng & Units 06/03/2020 06/02/2020 06/01/2020  Total Protein 6.5 - 8.1 g/dL 7.2 7.2 7.1  Albumin 3.5 - 5.0 g/dL 2.2(L) 2.5(L) 2.5(L)  AST 15 - 41 U/L 26 30 37  ALT 0 - 44 U/L _0 Alk Phosphatase 38 - 126 U/L 70 88 77  Total Bilirubin 0.3 - 1.2 mg/dL 0.9 0.8 1.1    2.  Aspiration pneumonia on 06/02/2020.  Unfortunately this happened most likely happened 06/01/20 when one of the family members was force-feeding patient despite nurse warning, patient was visibly coughing during this episode, chest x-ray shows new right-sided infiltrate, he has coarse right-sided breath sounds.  Unfortunately with COVID-19 pneumonia, his advanced age and a new aspiration pneumonia his prognosis is severely declined.  Patient following currently n.p.o., on IV Unasyn along with IV D5W.  Continue supportive care but clinically appears to be gradually declining.  3.  Anemia with evidence of some iron deficiency.  Placed on oral iron, continue PPI, outpatient age-appropriate anemia work-up per PCP.  Currently not a candidate for EGD or colonoscopy.  4.  Dyslipidemia on statin.  5.  Hypertension.  As needed hydralazine and monitor.  6.  Toxic encephalopathy.  Due to Covid infection and now aspiration pneumonia.  Supportive care.  Minimize narcotics and benzodiazepines.  7. Hypokalemia - replaced.  8. History of lung cancer with questionable right hilar mass.  History of  left-sided lung lobectomy, outpatient follow-up with PCP and pulmonologist post discharge.  Does not appear to be a candidate for invasive testing or procedures.  9.  Dehydration with hypernatremia.   D5W.    10. Rising D-dimer.  Due to intense inflammation from COVID-19, initial leg ultrasound is negative, continue on increased dose Lovenox dose and monitor closely.  Once D-dimer less than 1.5 will taper down to prophylactic dose.  Lab Results  Component Value Date   DDIMER 2.71 (H) 06/03/2020     Condition - Extremely Guarded  Family Communication  :  Lister Brizzi [phone] on 05/27/2020, 05/28/20, 05/29/20, 05/30/20, 05/31/20, 06/01/20, 06/02/2020, 06/03/2020  Code Status : DNR, if declines further focus on comfort.  Consults  : Palliative care for goals of care.  Procedures  :    CTA - 1. Diffuse right upper lobe ground-glass airspace disease, consistent with pneumonia. 2. Left pneumonectomy. 3. 4 cm ascending thoracic aortic aneurysm. Recommend annual imaging followup by CTA or MRA. This recommendation follows 2010 ACCF/AHA/AATS/ACR/ASA/SCA/SCAI/SIR/STS/SVM Guidelines for the Diagnosis and Management of Patients with Thoracic Aortic Disease. Circulation. 2010; 121: A449-P530. Aortic aneurysm NOS (ICD10-I71.9) 4.  Aortic Atherosclerosis (ICD10-I70.0)  Leg [place] - No DVT   PUD Prophylaxis : PPI  Disposition Plan  :    Status is: Inpatient  Remains inpatient appropriate because:IV treatments appropriate due to intensity of illness or inability to take PO   Dispo: The patient is from: Home              Anticipated d/c is to: Home              Anticipated d/c date is: > 3 days              Patient currently is not medically stable to d/c.   DVT Prophylaxis  :  Lovenox    Lab Results  Component Value Date   PLT 265 06/03/2020    Diet :  Diet Order            Diet NPO time specified  Diet  effective now                  Inpatient Medications  Scheduled Meds: . aspirin EC  81 mg Oral Daily  . atorvastatin  40 mg Oral q1800  . dexamethasone (DECADRON) injection  4 mg Intravenous Q24H  . docusate sodium  200 mg Oral BID  . enoxaparin (LOVENOX) injection  60 mg  Subcutaneous Q12H  . ferrous sulfate  325 mg Oral BID WC  . Ipratropium-Albuterol  1 puff Inhalation Q6H  . mouth rinse  15 mL Mouth Rinse BID  . metoprolol tartrate  50 mg Oral BID  . pantoprazole  40 mg Oral Daily   Continuous Infusions: . ampicillin-sulbactam (UNASYN) IV 200 mL/hr at 06/03/20 1000  . dextrose     PRN Meds:.acetaminophen, acetaminophen, guaiFENesin-dextromethorphan, haloperidol lactate, hydrALAZINE, senna-docusate  Antibiotics  :    Anti-infectives (From admission, onward)   Start     Dose/Rate Route Frequency Ordered Stop   06/02/20 1030  Ampicillin-Sulbactam (UNASYN) 3 g in sodium chloride 0.9 % 100 mL IVPB     Discontinue     3 g 200 mL/hr over 30 Minutes Intravenous Every 8 hours 06/02/20 0938     05/27/20 1000  remdesivir 100 mg in sodium chloride 0.9 % 100 mL IVPB       "Followed by" Linked Group Details   100 mg 200 mL/hr over 30 Minutes Intravenous Daily 05/17/2020 1348 05/30/20 1030   05/30/2020 1400  remdesivir 200 mg in sodium chloride 0.9% 250 mL IVPB       "Followed by" Linked Group Details   200 mg 580 mL/hr over 30 Minutes Intravenous Once 05/09/2020 1348 05/25/2020 1715       Time Spent in minutes  30   Lala Lund M.D on 06/03/2020 at 11:31 AM  To page go to [email or web address] - password [facility]  [facility] -  Office  [phone]  See all Orders from today for further details    Objective:   Vitals:   06/02/20 1400 06/02/20 1642 06/02/20 2057 06/03/20 0548  BP:  (!) 118/57 (!) 134/57 (!) 138/63  Pulse:  80 73 82  Resp:  _0 Temp: 98.3 F (36.8 C) 98.3 F (36.8 C) 98.6 F (37 C) 97.6 F (36.4 C)  TempSrc: Axillary Axillary Axillary Axillary  SpO2:  90% 96% 95%  Weight:      Height:        Wt Readings from Last 3 Encounters:  06/02/20 90.4 kg  01/17/20 103.8 kg  09/19/19 101.2 kg     Intake/Output Summary (Last 24 hours) at 06/03/2020 1131 Last data filed at 06/03/2020 1000 Gross per 24 hour  Intake 1532 ml    Output 1100 ml  Net 432 ml     Physical Exam  Awake but extremely confused, looks lethargic today, [place].AT,PERRAL Supple Neck,No JVD, No cervical lymphadenopathy appriciated.  Symmetrical Chest wall movement, coarse breath sounds right more than left RRR,No Gallops, Rubs or new Murmurs, No Parasternal Heave +ve B.Sounds, Abd Soft, No tenderness, No organomegaly appriciated, No rebound - guarding or rigidity. No Cyanosis, Clubbing or edema, No new Rash or bruise    Data Review:    CBC Recent Labs  Lab 05/29/20 0336 05/31/20 0653 06/01/20 0353 06/02/20 0402 06/03/20 0447  WBC 8.1 8.4 10.5 10.4 13.4*  HGB 10.7* 11.1* 10.2* 11.2* 10.9*  HCT 33.3* 35.4* 33.3* 37.8* 36.7*  PLT 244 329 341 332 265  MCV 91.2 92.9 93.3 95.9  97.3  MCH 29.3 29.1 28.6 28.4 28.9  MCHC 32.1 31.4 30.6 29.6* 29.7*  RDW 15.2 15.6* 15.6* 15.8* 16.0*  LYMPHSABS 0.8 0.3* 0.9 0.6* 0.5*  MONOABS 0.5 0.1 0.5 0.5 1.3*  EOSABS 0.0 0.0 0.1 0.0 0.0  BASOSABS 0.0 0.0 0.1 0.0 0.0    Chemistries  Recent Labs  Lab 05/29/20 0336 05/31/20 0653 06/01/20 0353 06/02/20 0402 06/03/20 0447  NA 137 141 143 148* 152*  K 3.2* 3.6 4.4 4.3 3.8  CL 101 102 105 106 112*  CO2 _0 35* 33*  GLUCOSE 133* 121* 130* 147* 133*  BUN 20 39* 40* 43* 39*  CREATININE 0.98 0.89 0.90 1.21 1.09  CALCIUM 8.2* 8.8* 8.7* 8.8* 8.7*  AST 62* 42* 37 30 26  ALT _1 ALKPHOS 74 77 77 88 70  BILITOT 1.0 0.9 1.1 0.8 0.9  MG 2.0 2.3 2.4 2.6* 2.5*     ------------------------------------------------------------------------------------------------------------------ No results for input(s): CHOL, HDL, LDLCALC, TRIG, CHOLHDL, LDLDIRECT in the last 72 hours.  Lab Results  Component Value Date   HGBA1C 5.4 08/16/2019   ------------------------------------------------------------------------------------------------------------------ No results for input(s): TSH, T4TOTAL, T3FREE, THYROIDAB in the last 72 hours.  Invalid  input(s): FREET3  Cardiac Enzymes No results for input(s): CKMB, TROPONINI, MYOGLOBIN in the last 168 hours.  Invalid input(s): CK ------------------------------------------------------------------------------------------------------------------    Component Value Date/Time   BNP 160.2 (H) 06/03/2020 0447    Micro Results Recent Results (from the past 240 hour(s))  Blood culture (routine x 2)     Status: None   Collection Time: 06/03/2020 11:41 AM   Specimen: BLOOD RIGHT FOREARM  Result Value Ref Range Status   Specimen Description BLOOD RIGHT FOREARM  Final   Special Requests   Final    BOTTLES DRAWN AEROBIC AND ANAEROBIC Blood Culture adequate volume   Culture   Final    NO GROWTH 5 DAYS Performed at [facility], [address] [place], [place] [address]    Report Status 05/31/2020 FINAL  Final  Blood culture (routine x 2)     Status: None   Collection Time: 05/29/2020 11:46 AM   Specimen: BLOOD RIGHT HAND  Result Value Ref Range Status   Specimen Description BLOOD RIGHT HAND  Final   Special Requests   Final    BOTTLES DRAWN AEROBIC AND ANAEROBIC Blood Culture results may not be optimal due to an inadequate volume of blood received in culture bottles   Culture   Final    NO GROWTH 5 DAYS Performed at [facility], [place] [address] [place], [place] [address]    Report Status 05/31/2020 FINAL  Final  SARS Coronavirus 2 by RT PCR (hospital order, performed in [facility] hospital lab) Nasopharyngeal Nasopharyngeal Swab     Status: Abnormal   Collection Time: 05/23/2020 11:49 AM   Specimen: Nasopharyngeal Swab  Result Value Ref Range Status   SARS Coronavirus 2 POSITIVE (A) NEGATIVE Final    Comment: RESULT CALLED TO, READ BACK BY AND VERIFIED WITH: RN S PLUNKETT AT 1339 05/15/2020 BY L BENFIELD (NOTE) SARS-CoV-2 target nucleic acids are DETECTED  SARS-CoV-2 RNA is generally detectable in upper respiratory specimens  during the acute phase of infection.  Positive  results are indicative  of the presence of the identified virus, but do not rule out bacterial infection or co-infection with other pathogens not detected by the test.  Clinical correlation with patient history and  other diagnostic information is  necessary to determine patient infection status.  The expected result is negative.  Fact Sheet for Patients:   StrictlyIdeas.no   Fact Sheet for Healthcare Providers:   BankingDealers.co.za    This test is not yet approved or cleared by the [place] FDA and  has been authorized for detection and/or diagnosis of SARS-CoV-2 by FDA under an Emergency Use Authorization (EUA).  This EUA will remain in effect (meanin g this test can be used) for the duration of  the COVID-19 declaration under Section 564(b)(1) of the Act, 21 U.S.C. section 360-bbb-3(b)(1), unless the authorization is terminated or revoked sooner.  Performed at [facility], [place] [address] [place], [place] [address]     Radiology Reports DG Chest 1 View  Result Date: 06/01/2020 CLINICAL DATA:  Altered mental status, recent UTI, history of lung cancer, lymphoma, hypertension, atrial fibrillation EXAM: CHEST  1 VIEW COMPARISON:  08/21/2019 FINDINGS: Post LEFT pneumonectomy with persistent opacification of LEFT hemithorax. Atherosclerotic calcification aorta. Perihilar infiltrates identified in RIGHT upper lobe consistent with pneumonia. Enlargement of RIGHT pulmonary hilum, could reflect prominent vascular structure, adenopathy, mass, or artifact from slight rotation to the LEFT. No pneumothorax. Bones demineralized with chronic RIGHT rotator cuff tear. IMPRESSION: Postsurgical changes LEFT pneumonectomy. New RIGHT upper lobe infiltrate consistent with pneumonia. Question RIGHT hilar mass/adenopathy versus artifact from rotation. Electronically Signed   By: Lavonia Dana M.D.   On: 06/03/2020 12:49   CT Head Wo Contrast  Result  Date: 05/25/2020 CLINICAL DATA:  Altered mental status EXAM: CT HEAD WITHOUT CONTRAST TECHNIQUE: Contiguous axial images were obtained from the base of the skull through the vertex without intravenous contrast. COMPARISON:  08/16/2019 FINDINGS: Brain: Atrophic changes and scattered lacunar infarcts and chronic white matter ischemic change are seen and relatively stable from the prior exam. No findings to suggest acute hemorrhage, acute infarction or space-occupying mass lesion are noted. Vascular: No hyperdense vessel or unexpected calcification. Skull: Normal. Negative for fracture or focal lesion. Sinuses/Orbits: No acute finding. Other: None. IMPRESSION: Atrophic and ischemic changes without acute abnormality. Electronically Signed   By: Inez Catalina M.D.   On: 05/27/2020 12:52   CT CHEST W CONTRAST  Result Date: 05/30/2020 CLINICAL DATA:  Pneumonia EXAM: CT CHEST WITH CONTRAST TECHNIQUE: Multidetector CT imaging of the chest was performed during intravenous contrast administration. CONTRAST:  49m OMNIPAQUE IOHEXOL 300 MG/ML  SOLN COMPARISON:  05/29/2020 FINDINGS: Cardiovascular: Heart is unremarkable without pericardial effusion. Mild dilatation of the ascending thoracic aorta measuring 4 cm in diameter. Moderate atherosclerosis. Mediastinum/Nodes: No enlarged mediastinal, hilar, or axillary lymph nodes. Thyroid gland, trachea, and esophagus demonstrate no significant findings. Lungs/Pleura: Postsurgical changes from left pneumonectomy, with fluid filling the left chest cavity. Diffuse right upper lobe airspace disease unchanged since previous chest x-ray. Hypoventilatory changes are seen within the right lower lobe. No right effusion or pneumothorax. Upper Abdomen: No acute abnormality. Musculoskeletal: No acute or destructive bony abnormalities. Chronic wedge compression deformities at the thoracolumbar junction. Reconstructed images demonstrate no additional findings. IMPRESSION: 1. Diffuse right upper  lobe ground-glass airspace disease, consistent with pneumonia. 2. Left pneumonectomy. 3. 4 cm ascending thoracic aortic aneurysm. Recommend annual imaging followup by CTA or MRA. This recommendation follows 2010 ACCF/AHA/AATS/ACR/ASA/SCA/SCAI/SIR/STS/SVM Guidelines for the Diagnosis and Management of Patients with Thoracic Aortic Disease. Circulation. 2010; 121:: I627-O350 Aortic aneurysm NOS (ICD10-I71.9) 4.  Aortic Atherosclerosis (ICD10-I70.0). Electronically Signed   By: MRanda NgoM.D.   On: 05/30/2020 04:08   [facility]  Result  Date: 06/02/2020 CLINICAL DATA:  Shortness of breath and lethargy EXAM: PORTABLE CHEST 1 VIEW COMPARISON:  May 31, 2020 chest radiograph and chest CT May 30, 2020 FINDINGS: Status post pneumonectomy on the left with shift of heart and mediastinum toward the left. There is focal airspace opacity in the right upper lobe abutting the minor fissure which is significantly more consolidated compared to 2 days prior. Ill-defined opacity noted in the right base, similar to 2 days prior. There is less ill-defined airspace opacity in the right upper lobe compared to 2 days prior. Cardiac silhouette visualization limited due to opacity on the left from prior pneumonectomy. No gross change in cardiac silhouette. No adenopathy appreciable in areas that can be assessed for potential adenopathy by radiography. Postoperative change noted on the left. There is aortic atherosclerosis. IMPRESSION: 1. In comparison with chest radiograph obtained 2 days prior, there is now a focal well-defined consolidation in the right upper lobe adjacent to the minor fissure. More ill-defined airspace opacity throughout the right lung has cleared considerably compared to 2 days prior. Ill-defined opacity in the right base is stable. 2. Stable changes from prior pneumonectomy on the left. No gross change in cardiac silhouette. 3.  Aortic Atherosclerosis (ICD10-I70.0). Electronically Signed   By: Lowella Grip III M.D.   On: 06/02/2020 09:13   DG Chest Port 1 View  Result Date: 05/31/2020 CLINICAL DATA:  Status post left pneumonectomy. Shortness of breath. EXAM: PORTABLE CHEST 1 VIEW COMPARISON:  05/30/2020 FINDINGS: Postoperative changes from left pneumonectomy. There is complete opacification of the left hemithorax, unchanged. Diffuse interstitial opacities within the right upper lobe appear unchanged from the previous study. No new findings. IMPRESSION: 1. No change in aeration to the right upper lobe compared with previous exam. 2. Stable postoperative changes from left pneumonectomy. Electronically Signed   By: Kerby Moors M.D.   On: 05/31/2020 10:33   DG Chest Port 1 View  Result Date: 05/30/2020 CLINICAL DATA:  Shortness of breath EXAM: PORTABLE CHEST 1 VIEW COMPARISON:  05/29/2020 FINDINGS: Postsurgical changes are noted on the left consistent with pneumonectomy. Right lung is well aerated with diffuse upper lobe airspace opacity stable in appearance from the prior exam. No new focal abnormality is seen. IMPRESSION: Stable changes from the previous day. Electronically Signed   By: Inez Catalina M.D.   On: 05/30/2020 08:48   DG Chest Port 1 View  Result Date: 05/29/2020 CLINICAL DATA:  Shortness of breath.  COVID 19 positive. EXAM: PORTABLE CHEST 1 VIEW COMPARISON:  05/28/2020 FINDINGS: Sequelae of left pneumonectomy are again identified with unchanged opacification of the left hemithorax. Right hilar prominence is unchanged. There is persistent airspace opacity throughout the right upper lobe without significant interval change. Mild right basilar opacity has decreased. No sizable right-sided pleural effusion or pneumothorax is identified. IMPRESSION: 1. Unchanged right upper lobe pneumonia. 2. Improved right basilar aeration. Electronically Signed   By: Logan Bores M.D.   On: 05/29/2020 08:17   DG Chest Port 1 View  Result Date: 05/28/2020 CLINICAL DATA:  Shortness of breath, COVID-19  positive. EXAM: PORTABLE CHEST 1 VIEW COMPARISON:  May 27, 2020. FINDINGS: Status post left pneumonectomy. No pneumothorax is noted stable right upper lobe airspace opacity is noted consistent with pneumonia. Mildly increased right basilar opacity is noted concerning for worsening pneumonia. Bony thorax is unremarkable. IMPRESSION: Stable right upper lobe airspace opacity is noted consistent with pneumonia. Mildly increased right basilar opacity is noted concerning for worsening pneumonia. Electronically Signed  By: Marijo Conception M.D.   On: 05/28/2020 09:03   DG Chest Port 1 View  Result Date: 05/27/2020 CLINICAL DATA:  Shortness of breath, pneumonia, COVID-19 EXAM: PORTABLE CHEST 1 VIEW COMPARISON:  Portable exam 0649 hours compared to 05/16/2020 FINDINGS: Persistent complete opacification of the LEFT hemithorax postpneumonectomy. Increased RIGHT upper lobe infiltrate consistent with pneumonia. Prominence of RIGHT hilum unchanged. Minimal atelectasis at RIGHT base. No pneumothorax. Bones demineralized. IMPRESSION: Increased RIGHT upper lobe infiltrate consistent with pneumonia. Post LEFT pneumonectomy. Electronically Signed   By: Lavonia Dana M.D.   On: 05/27/2020 09:14   VAS [place] LOWER EXTREMITY VENOUS (DVT)  Result Date: 05/27/2020  Lower Venous DVTStudy Indications: Edema, Swelling, and Rapidly rising D-dimer.  Comparison Study: no prior Performing Technologist: Abram Sander RVS  Examination Guidelines: A complete evaluation includes B-mode imaging, spectral Doppler, color Doppler, and power Doppler as needed of all accessible portions of each vessel. Bilateral testing is considered an integral part of a complete examination. Limited examinations for reoccurring indications may be performed as noted. The reflux portion of the exam is performed with the patient in reverse Trendelenburg.  +---------+---------------+---------+-----------+----------+--------------+ RIGHT     CompressibilityPhasicitySpontaneityPropertiesThrombus Aging +---------+---------------+---------+-----------+----------+--------------+ CFV      Full           Yes      Yes                                 +---------+---------------+---------+-----------+----------+--------------+ SFJ      Full                                                        +---------+---------------+---------+-----------+----------+--------------+ FV Prox  Full                                                        +---------+---------------+---------+-----------+----------+--------------+ FV Mid   Full                                                        +---------+---------------+---------+-----------+----------+--------------+ FV DistalFull                                                        +---------+---------------+---------+-----------+----------+--------------+ PFV      Full                                                        +---------+---------------+---------+-----------+----------+--------------+ POP      Full           Yes      Yes                                 +---------+---------------+---------+-----------+----------+--------------+  PTV      Full                                                        +---------+---------------+---------+-----------+----------+--------------+ PERO     Full                                                        +---------+---------------+---------+-----------+----------+--------------+   +---------+---------------+---------+-----------+----------+--------------+ LEFT     CompressibilityPhasicitySpontaneityPropertiesThrombus Aging +---------+---------------+---------+-----------+----------+--------------+ CFV      Full           Yes      Yes                                 +---------+---------------+---------+-----------+----------+--------------+ SFJ      Full                                                         +---------+---------------+---------+-----------+----------+--------------+ FV Prox  Full                                                        +---------+---------------+---------+-----------+----------+--------------+ FV Mid   Full                                                        +---------+---------------+---------+-----------+----------+--------------+ FV DistalFull                                                        +---------+---------------+---------+-----------+----------+--------------+ PFV      Full                                                        +---------+---------------+---------+-----------+----------+--------------+ POP      Full           Yes      Yes                                 +---------+---------------+---------+-----------+----------+--------------+ PTV      Full                                                        +---------+---------------+---------+-----------+----------+--------------+   PERO                                                  Not visualized +---------+---------------+---------+-----------+----------+--------------+     Summary: BILATERAL: - No evidence of deep vein thrombosis seen in the lower extremities, bilaterally. - No evidence of superficial venous thrombosis in the lower extremities, bilaterally. -   *See table(s) above for measurements and observations. Electronically signed by Servando Snare MD on 05/27/2020 at 5:30:45 PM.    Final

## 2020-06-03 NOTE — Progress Notes (Signed)
Alerted by MD that patient was "almost out of the bed." No bed alarm sounding. Upon entering room, noted that bed alarm had been turned off, patient left unattended by grandson. Bed alarm was active during bedside rounds this morning. Assisted MD in placing patient properly back to bed, mittens returned as were on prior during rounds and patient IV restarted. Charge RN made aware.

## 2020-06-03 NOTE — Progress Notes (Signed)
PT Cancellation/Discharge Note  Patient Details Name: Craig Chapman MRN: [id number] DOB: 1928/01/01   Cancelled Treatment:    Reason Eval/Treat Not Completed: Medical issues which prohibited therapy.  I spoke with pt's RN, Otila Kluver and reached out to MD, Dr. Candiss Norse and both recommended holding PT for this pt at this time.  Dr. Candiss Norse to re-order physical therapy if/when appropriate.  PT to sign off.  Thanks,  Verdene Lennert, PT, DPT  Acute Rehabilitation [phone] pager #[phone] office       Barbarann Ehlers Azaylah Stailey 06/03/2020, 4:50 PM

## 2020-06-03 NOTE — Evaluation (Signed)
Clinical/Bedside Swallow Evaluation Patient Details  Name: Craig Chapman MRN: [id number] Date of Birth: [birth year]  Today's Date: 06/03/2020 Time: SLP Start Time (ACUTE ONLY): 4270 SLP Stop Time (ACUTE ONLY): 1528 SLP Time Calculation (min) (ACUTE ONLY): 9 min  Past Medical History:  Past Medical History:  Diagnosis Date  . Arthritis   . Atrial fibrillation ([place])   . History of lung or bronchial cancer   . Hypertension   . Lymphoma ([place])   . TIA (transient ischemic attack)    Past Surgical History:  Past Surgical History:  Procedure Laterality Date  . BACK SURGERY    . CHOLECYSTECTOMY    . HERNIA REPAIR    . ORIF FEMUR FRACTURE Right 05/15/2017   Procedure: OPEN REDUCTION INTERNAL FIXATION (ORIF) DISTAL FEMUR FRACTURE;  Surgeon: Newt Minion, MD;  Location: [facility];  Service: Orthopedics;  Laterality: Right;  . PNEUMONECTOMY    . REPLACEMENT TOTAL KNEE BILATERAL    . ROTATOR CUFF REPAIR     HPI:  86 y.o. male with medical history significant of TIA, HLD, paroxysmal A. Fib, remote history of lymphoma, remote history of lung cancer status post left pneumonectomy, presented with confusion and short of breath. +Acute hypoxic respiratory failure secondary to COVID-19 pneumonia; anemia; 7/22 medical decline and initiated comfort care measures; 7/23 pt improving and therapies re-ordered (per PT note). MBS 08/17/19 recommended Dys 3, nectar (late laryngeal closure, decreased cricopharyngeal relaxation), sensed aspiration of thin.    Assessment / Plan / Recommendation Clinical Impression  Received order after aspiration episode 7/25 (chart states pt coughing with family member feeding pt). ST reordered (not appropriate on 7/22) to reassess and today he is not able to safely or efficiently consuem po's. Pt has labored respirations at rest, uses slight head gestures to communicate. Respirations are extremely wet and congested, unable to cough on command or move lips/tongue for oral-motor exam.  He was uable to manipulate small ice chip with cues and subsequently suctioned. Recommend to keep oral cavity moist as able. If he is able or becomes more alert, he could have small ice chip to moisten oral cavity. ST will sign off at this time.  SLP Visit Diagnosis: Dysphagia, oropharyngeal phase (R13.12)    Aspiration Risk  Severe aspiration risk    Diet Recommendation NPO        Other  Recommendations Oral Care Recommendations: Oral care BID   Follow up Recommendations None      Frequency and Duration            Prognosis        Swallow Study   General HPI: 86 y.o. male with medical history significant of TIA, HLD, paroxysmal A. Fib, remote history of lymphoma, remote history of lung cancer status post left pneumonectomy, presented with confusion and short of breath. +Acute hypoxic respiratory failure secondary to COVID-19 pneumonia; anemia; 7/22 medical decline and initiated comfort care measures; 7/23 pt improving and therapies re-ordered (per PT note). MBS 08/17/19 recommended Dys 3, nectar (late laryngeal closure, decreased cricopharyngeal relaxation), sensed aspiration of thin.  Type of Study: Bedside Swallow Evaluation Previous Swallow Assessment:  (see HPI) Diet Prior to this Study: NPO Temperature Spikes Noted: No Respiratory Status: Nasal cannula History of Recent Intubation: No Behavior/Cognition: Requires cueing;Lethargic/Drowsy Oral Cavity Assessment: Dry Oral Care Completed by SLP: Yes Oral Cavity - Dentition:  (difficult to fully view) Self-Feeding Abilities: Total assist Patient Positioning: Upright in bed Baseline Vocal Quality:  (no vocalizations, wet respirations) Volitional Cough:  (pt  shook his head) Volitional Swallow: Unable to elicit    Oral/Motor/Sensory Function Overall Oral Motor/Sensory Function:  (pt unable- too weak)   Ice Chips Ice chips: Impaired Presentation: Spoon Oral Phase Impairments: Reduced labial seal;Reduced lingual  movement/coordination;Poor awareness of bolus Oral Phase Functional Implications: Oral holding Pharyngeal Phase Impairments:  (not initiated)   Thin Liquid Thin Liquid: Not tested    Nectar Thick Nectar Thick Liquid: Not tested   Honey Thick Honey Thick Liquid: Not tested   Puree Puree: Not tested   Solid     Solid: Not tested      Houston Siren 06/03/2020,4:09 PM

## 2020-06-03 NOTE — Progress Notes (Signed)
This chaplain updated Kathrin Ruddy on the Pt. Visitor (based on the chaplain's earlier communication with the [facility] [place] [facility]. RN) restrictions in the [place].

## 2020-06-03 NOTE — Progress Notes (Signed)
Chaplain at bedside not in proper PPE upon entering room, mask placed on once staff was in room. Will inform charge nurse.  2300:Grandson now in room. Proper PPE is not in place for grandson. Yolanda Bonine has been educated with no changes.

## 2020-06-03 NOTE — Progress Notes (Signed)
John Bettis introduced himself to this chaplain as the Pt. grandson and a Engineer, production. This chaplain understands after having a conversation with the unit Chg RN-Sarah:   The Pt. is COVID positive, no visitors are allowed at this time. If the Pt. condition changes the visitor restrictions may be re-evaluated.  The Pt. MD is updating the Pt. son daily. Hospital policy is medical updates are only given to the people on Emergency Contacts.

## 2020-06-07 LAB — CULTURE, BLOOD (ROUTINE X 2)
Culture: NO GROWTH
Culture: NO GROWTH
Special Requests: ADEQUATE
Special Requests: ADEQUATE

## 2020-06-08 NOTE — Death Summary Note (Signed)
DEATH SUMMARY   Patient Details  Name: Craig Chapman MRN: [id number] DOB: Dec 24, 1927  Admission/Discharge Information   Admit Date:  June 18, 2020  Date of Death: Date of Death: 2020/06/27  Time of Death: Time of Death: 02-28-23  Length of Stay: 2023/02/07  Referring Physician: Lavone Orn, MD   Reason(s) for Hospitalization  Altered mental status and shortness of breath  Diagnoses  Preliminary cause of death:  Secondary Diagnoses (including complications and co-morbidities):  Active Problems:   COVID-19 virus infection   Pneumonia due to COVID-19 virus   Brief Hospital Course (including significant findings, care, treatment, and services provided and events leading to death)  Craig Chapman is a 86 y.o. year old male who has a medical history significant ofHLD,paroxysmal A. Fib,remote history of lymphoma, remote history of lung cancer status post left pneumonectomy,presented with confusion and short of breath, he was diagnosed with acute hypoxic respiratory failure due to COVID-19 pneumonia and admitted to the hospital. He was treated with Decadron and remdesivir.  And his DNR CODE STATUS and family decided to make for comfort care and did not want aggressive interventions if condition worsened. Early morning of May 27, 2020 patient had increased work of breathing with cessation of breathing at 12:30 in the morning.  He died peacefully with RN at bedside.    Pertinent Labs and Studies  Significant Diagnostic Studies DG Chest 1 View  Result Date: 06/18/20 CLINICAL DATA:  Altered mental status, recent UTI, history of lung cancer, lymphoma, hypertension, atrial fibrillation EXAM: CHEST  1 VIEW COMPARISON:  08/21/2019 FINDINGS: Post LEFT pneumonectomy with persistent opacification of LEFT hemithorax. Atherosclerotic calcification aorta. Perihilar infiltrates identified in RIGHT upper lobe consistent with pneumonia. Enlargement of RIGHT pulmonary hilum, could reflect prominent vascular  structure, adenopathy, mass, or artifact from slight rotation to the LEFT. No pneumothorax. Bones demineralized with chronic RIGHT rotator cuff tear. IMPRESSION: Postsurgical changes LEFT pneumonectomy. New RIGHT upper lobe infiltrate consistent with pneumonia. Question RIGHT hilar mass/adenopathy versus artifact from rotation. Electronically Signed   By: Lavonia Dana M.D.   On: 06-18-20 12:49   CT Head Wo Contrast  Result Date: 06-18-20 CLINICAL DATA:  Altered mental status EXAM: CT HEAD WITHOUT CONTRAST TECHNIQUE: Contiguous axial images were obtained from the base of the skull through the vertex without intravenous contrast. COMPARISON:  08/16/2019 FINDINGS: Brain: Atrophic changes and scattered lacunar infarcts and chronic white matter ischemic change are seen and relatively stable from the prior exam. No findings to suggest acute hemorrhage, acute infarction or space-occupying mass lesion are noted. Vascular: No hyperdense vessel or unexpected calcification. Skull: Normal. Negative for fracture or focal lesion. Sinuses/Orbits: No acute finding. Other: None. IMPRESSION: Atrophic and ischemic changes without acute abnormality. Electronically Signed   By: Inez Catalina M.D.   On: June 18, 2020 12:52   CT CHEST W CONTRAST  Result Date: 05/30/2020 CLINICAL DATA:  Pneumonia EXAM: CT CHEST WITH CONTRAST TECHNIQUE: Multidetector CT imaging of the chest was performed during intravenous contrast administration. CONTRAST:  80mL OMNIPAQUE IOHEXOL 300 MG/ML  SOLN COMPARISON:  05/29/2020 FINDINGS: Cardiovascular: Heart is unremarkable without pericardial effusion. Mild dilatation of the ascending thoracic aorta measuring 4 cm in diameter. Moderate atherosclerosis. Mediastinum/Nodes: No enlarged mediastinal, hilar, or axillary lymph nodes. Thyroid gland, trachea, and esophagus demonstrate no significant findings. Lungs/Pleura: Postsurgical changes from left pneumonectomy, with fluid filling the left chest cavity.  Diffuse right upper lobe airspace disease unchanged since previous chest x-ray. Hypoventilatory changes are seen within the right lower lobe. No right effusion  or pneumothorax. Upper Abdomen: No acute abnormality. Musculoskeletal: No acute or destructive bony abnormalities. Chronic wedge compression deformities at the thoracolumbar junction. Reconstructed images demonstrate no additional findings. IMPRESSION: 1. Diffuse right upper lobe ground-glass airspace disease, consistent with pneumonia. 2. Left pneumonectomy. 3. 4 cm ascending thoracic aortic aneurysm. Recommend annual imaging followup by CTA or MRA. This recommendation follows 2010 ACCF/AHA/AATS/ACR/ASA/SCA/SCAI/SIR/STS/SVM Guidelines for the Diagnosis and Management of Patients with Thoracic Aortic Disease. Circulation. 2010; 121: W737-T062. Aortic aneurysm NOS (ICD10-I71.9) 4.  Aortic Atherosclerosis (ICD10-I70.0). Electronically Signed   By: Randa Ngo M.D.   On: 05/30/2020 04:08   DG Chest Port 1 View  Result Date: 06/02/2020 CLINICAL DATA:  Shortness of breath and lethargy EXAM: PORTABLE CHEST 1 VIEW COMPARISON:  May 31, 2020 chest radiograph and chest CT May 30, 2020 FINDINGS: Status post pneumonectomy on the left with shift of heart and mediastinum toward the left. There is focal airspace opacity in the right upper lobe abutting the minor fissure which is significantly more consolidated compared to 2 days prior. Ill-defined opacity noted in the right base, similar to 2 days prior. There is less ill-defined airspace opacity in the right upper lobe compared to 2 days prior. Cardiac silhouette visualization limited due to opacity on the left from prior pneumonectomy. No gross change in cardiac silhouette. No adenopathy appreciable in areas that can be assessed for potential adenopathy by radiography. Postoperative change noted on the left. There is aortic atherosclerosis. IMPRESSION: 1. In comparison with chest radiograph obtained 2 days prior,  there is now a focal well-defined consolidation in the right upper lobe adjacent to the minor fissure. More ill-defined airspace opacity throughout the right lung has cleared considerably compared to 2 days prior. Ill-defined opacity in the right base is stable. 2. Stable changes from prior pneumonectomy on the left. No gross change in cardiac silhouette. 3.  Aortic Atherosclerosis (ICD10-I70.0). Electronically Signed   By: Lowella Grip III M.D.   On: 06/02/2020 09:13   DG Chest Port 1 View  Result Date: 05/31/2020 CLINICAL DATA:  Status post left pneumonectomy. Shortness of breath. EXAM: PORTABLE CHEST 1 VIEW COMPARISON:  05/30/2020 FINDINGS: Postoperative changes from left pneumonectomy. There is complete opacification of the left hemithorax, unchanged. Diffuse interstitial opacities within the right upper lobe appear unchanged from the previous study. No new findings. IMPRESSION: 1. No change in aeration to the right upper lobe compared with previous exam. 2. Stable postoperative changes from left pneumonectomy. Electronically Signed   By: Kerby Moors M.D.   On: 05/31/2020 10:33   DG Chest Port 1 View  Result Date: 05/30/2020 CLINICAL DATA:  Shortness of breath EXAM: PORTABLE CHEST 1 VIEW COMPARISON:  05/29/2020 FINDINGS: Postsurgical changes are noted on the left consistent with pneumonectomy. Right lung is well aerated with diffuse upper lobe airspace opacity stable in appearance from the prior exam. No new focal abnormality is seen. IMPRESSION: Stable changes from the previous day. Electronically Signed   By: Inez Catalina M.D.   On: 05/30/2020 08:48   DG Chest Port 1 View  Result Date: 05/29/2020 CLINICAL DATA:  Shortness of breath.  COVID 19 positive. EXAM: PORTABLE CHEST 1 VIEW COMPARISON:  05/28/2020 FINDINGS: Sequelae of left pneumonectomy are again identified with unchanged opacification of the left hemithorax. Right hilar prominence is unchanged. There is persistent airspace opacity  throughout the right upper lobe without significant interval change. Mild right basilar opacity has decreased. No sizable right-sided pleural effusion or pneumothorax is identified. IMPRESSION: 1. Unchanged right upper  lobe pneumonia. 2. Improved right basilar aeration. Electronically Signed   By: Logan Bores M.D.   On: 05/29/2020 08:17   DG Chest Port 1 View  Result Date: 05/28/2020 CLINICAL DATA:  Shortness of breath, COVID-19 positive. EXAM: PORTABLE CHEST 1 VIEW COMPARISON:  May 27, 2020. FINDINGS: Status post left pneumonectomy. No pneumothorax is noted stable right upper lobe airspace opacity is noted consistent with pneumonia. Mildly increased right basilar opacity is noted concerning for worsening pneumonia. Bony thorax is unremarkable. IMPRESSION: Stable right upper lobe airspace opacity is noted consistent with pneumonia. Mildly increased right basilar opacity is noted concerning for worsening pneumonia. Electronically Signed   By: Marijo Conception M.D.   On: 05/28/2020 09:03   DG Chest Port 1 View  Result Date: 05/27/2020 CLINICAL DATA:  Shortness of breath, pneumonia, COVID-19 EXAM: PORTABLE CHEST 1 VIEW COMPARISON:  Portable exam 0649 hours compared to 05/23/2020 FINDINGS: Persistent complete opacification of the LEFT hemithorax postpneumonectomy. Increased RIGHT upper lobe infiltrate consistent with pneumonia. Prominence of RIGHT hilum unchanged. Minimal atelectasis at RIGHT base. No pneumothorax. Bones demineralized. IMPRESSION: Increased RIGHT upper lobe infiltrate consistent with pneumonia. Post LEFT pneumonectomy. Electronically Signed   By: Lavonia Dana M.D.   On: 05/27/2020 09:14   VAS [place] LOWER EXTREMITY VENOUS (DVT)  Result Date: 05/27/2020  Lower Venous DVTStudy Indications: Edema, Swelling, and Rapidly rising D-dimer.  Comparison Study: no prior Performing Technologist: Abram Sander RVS  Examination Guidelines: A complete evaluation includes B-mode imaging, spectral Doppler, color  Doppler, and power Doppler as needed of all accessible portions of each vessel. Bilateral testing is considered an integral part of a complete examination. Limited examinations for reoccurring indications may be performed as noted. The reflux portion of the exam is performed with the patient in reverse Trendelenburg.  +---------+---------------+---------+-----------+----------+--------------+ RIGHT    CompressibilityPhasicitySpontaneityPropertiesThrombus Aging +---------+---------------+---------+-----------+----------+--------------+ CFV      Full           Yes      Yes                                 +---------+---------------+---------+-----------+----------+--------------+ SFJ      Full                                                        +---------+---------------+---------+-----------+----------+--------------+ FV Prox  Full                                                        +---------+---------------+---------+-----------+----------+--------------+ FV Mid   Full                                                        +---------+---------------+---------+-----------+----------+--------------+ FV DistalFull                                                        +---------+---------------+---------+-----------+----------+--------------+  PFV      Full                                                        +---------+---------------+---------+-----------+----------+--------------+ POP      Full           Yes      Yes                                 +---------+---------------+---------+-----------+----------+--------------+ PTV      Full                                                        +---------+---------------+---------+-----------+----------+--------------+ PERO     Full                                                        +---------+---------------+---------+-----------+----------+--------------+    +---------+---------------+---------+-----------+----------+--------------+ LEFT     CompressibilityPhasicitySpontaneityPropertiesThrombus Aging +---------+---------------+---------+-----------+----------+--------------+ CFV      Full           Yes      Yes                                 +---------+---------------+---------+-----------+----------+--------------+ SFJ      Full                                                        +---------+---------------+---------+-----------+----------+--------------+ FV Prox  Full                                                        +---------+---------------+---------+-----------+----------+--------------+ FV Mid   Full                                                        +---------+---------------+---------+-----------+----------+--------------+ FV DistalFull                                                        +---------+---------------+---------+-----------+----------+--------------+ PFV      Full                                                        +---------+---------------+---------+-----------+----------+--------------+  POP      Full           Yes      Yes                                 +---------+---------------+---------+-----------+----------+--------------+ PTV      Full                                                        +---------+---------------+---------+-----------+----------+--------------+ PERO                                                  Not visualized +---------+---------------+---------+-----------+----------+--------------+     Summary: BILATERAL: - No evidence of deep vein thrombosis seen in the lower extremities, bilaterally. - No evidence of superficial venous thrombosis in the lower extremities, bilaterally. -   *See table(s) above for measurements and observations. Electronically signed by Servando Snare MD on 05/27/2020 at 5:30:45 PM.    Final      Microbiology Recent Results (from the past 240 hour(s))  Blood culture (routine x 2)     Status: None   Collection Time: 05/29/2020 11:41 AM   Specimen: BLOOD RIGHT FOREARM  Result Value Ref Range Status   Specimen Description BLOOD RIGHT FOREARM  Final   Special Requests   Final    BOTTLES DRAWN AEROBIC AND ANAEROBIC Blood Culture adequate volume   Culture   Final    NO GROWTH 5 DAYS Performed at [facility], [address], [place], [place] [address]    Report Status 05/31/2020 FINAL  Final  Blood culture (routine x 2)     Status: None   Collection Time: 05/10/2020 11:46 AM   Specimen: BLOOD RIGHT HAND  Result Value Ref Range Status   Specimen Description BLOOD RIGHT HAND  Final   Special Requests   Final    BOTTLES DRAWN AEROBIC AND ANAEROBIC Blood Culture results may not be optimal due to an inadequate volume of blood received in culture bottles   Culture   Final    NO GROWTH 5 DAYS Performed at [facility], [place] [address] [place], [place] [address]    Report Status 05/31/2020 FINAL  Final  SARS Coronavirus 2 by RT PCR (hospital order, performed in [facility] hospital lab) Nasopharyngeal Nasopharyngeal Swab     Status: Abnormal   Collection Time: 06/02/2020 11:49 AM   Specimen: Nasopharyngeal Swab  Result Value Ref Range Status   SARS Coronavirus 2 POSITIVE (A) NEGATIVE Final    Comment: RESULT CALLED TO, READ BACK BY AND VERIFIED WITH: RN S PLUNKETT AT 1339 05/27/2020 BY L BENFIELD (NOTE) SARS-CoV-2 target nucleic acids are DETECTED  SARS-CoV-2 RNA is generally detectable in upper respiratory specimens  during the acute phase of infection.  Positive results are indicative  of the presence of the identified virus, but do not rule out bacterial infection or co-infection with other pathogens not detected by the test.  Clinical correlation with patient history and  other diagnostic information is necessary to determine patient infection status.  The expected  result is negative.  Fact Sheet for  Patients:   StrictlyIdeas.no   Fact Sheet for Healthcare Providers:   BankingDealers.co.za    This test is not yet approved or cleared by the [place] FDA and  has been authorized for detection and/or diagnosis of SARS-CoV-2 by FDA under an Emergency Use Authorization (EUA).  This EUA will remain in effect (meanin g this test can be used) for the duration of  the COVID-19 declaration under Section 564(b)(1) of the Act, 21 U.S.C. section 360-bbb-3(b)(1), unless the authorization is terminated or revoked sooner.  Performed at [facility], [place] [address] [place], [place] [address]   Culture, blood (routine x 2)     Status: None (Preliminary result)   Collection Time: 06/02/20 10:52 AM   Specimen: BLOOD RIGHT ARM  Result Value Ref Range Status   Specimen Description BLOOD RIGHT ARM  Final   Special Requests   Final    BOTTLES DRAWN AEROBIC AND ANAEROBIC Blood Culture adequate volume   Culture   Final    NO GROWTH 1 DAY Performed at [facility], [place] [address], [place], [place] [address]    Report Status PENDING  Incomplete  Culture, blood (routine x 2)     Status: None (Preliminary result)   Collection Time: 06/02/20 10:59 AM   Specimen: BLOOD LEFT ARM  Result Value Ref Range Status   Specimen Description BLOOD LEFT ARM  Final   Special Requests   Final    BOTTLES DRAWN AEROBIC AND ANAEROBIC Blood Culture adequate volume   Culture   Final    NO GROWTH 1 DAY Performed at [facility], [place] [address] [place], [place] [address]    Report Status PENDING  Incomplete    Lab Basic Metabolic Panel: Recent Labs  Lab 05/29/20 0336 05/31/20 0653 06/01/20 0353 06/02/20 0402 06/03/20 0447  NA 137 141 143 148* 152*  K 3.2* 3.6 4.4 4.3 3.8  CL 101 102 105 106 112*  CO2 26 28 29  35* 33*  GLUCOSE 133* 121* 130* 147* 133*  BUN 20 39* 40* 43* 39*  CREATININE 0.98 0.89 0.90  1.21 1.09  CALCIUM 8.2* 8.8* 8.7* 8.8* 8.7*  MG 2.0 2.3 2.4 2.6* 2.5*   Liver Function Tests: Recent Labs  Lab 05/29/20 0336 05/31/20 0653 06/01/20 0353 06/02/20 0402 06/03/20 0447  AST 62* 42* 37 30 26  ALT 30 27 26 23 21   ALKPHOS 74 77 77 88 70  BILITOT 1.0 0.9 1.1 0.8 0.9  PROT 7.0 7.1 7.1 7.2 7.2  ALBUMIN 2.7* 2.6* 2.5* 2.5* 2.2*   No results for input(s): LIPASE, AMYLASE in the last 168 hours. No results for input(s): AMMONIA in the last 168 hours. CBC: Recent Labs  Lab 05/29/20 0336 05/31/20 0653 06/01/20 0353 06/02/20 0402 06/03/20 0447  WBC 8.1 8.4 10.5 10.4 13.4*  NEUTROABS 6.6 8.0* 9.5* 9.2* 11.5*  HGB 10.7* 11.1* 10.2* 11.2* 10.9*  HCT 33.3* 35.4* 33.3* 37.8* 36.7*  MCV 91.2 92.9 93.3 95.9 97.3  PLT 244 329 341 332 265   Cardiac Enzymes: No results for input(s): CKTOTAL, CKMB, CKMBINDEX, TROPONINI in the last 168 hours. Sepsis Labs: Recent Labs  Lab 05/31/20 0653 06/01/20 0353 06/02/20 0402 06/03/20 0447  PROCALCITON <0.10 <0.10 <0.10 0.51  WBC 8.4 10.5 10.4 13.4*    Procedures/Operations     Yevonne Aline Rukia Mcgillivray 06/08/20, 2:11 AM

## 2020-06-08 NOTE — Progress Notes (Signed)
Multiple patients family members came to the floor to see the patient after his passing. Many of the patient's family members (grandson, son, and daughter-in-law) also questioned this RN multiple times about the patient's death and why weren't they called immediately following his death. This RN informed the family that this RN needed to notify the MD and clean up the patient for the family to view. The patient's grandson demanded to have a meeting set up with the [facility] unit director on the morning of 7/28. The grandson also demanded to see the patient's medical records, but we referred him to medical records.This RN informed the patient's clergy, who came from outside the hospital, that only 4-5 family members would be allowed to see the patient, but only in a group of 3. Many of the family members continued to rotate and come to the bedside between 0230 and 0530. While the family was visiting the patient, multiple attempts by this RN and another RN Ronalee Belts) were made asking the patient's grandson, [place], to wear a mask, but he ignored all staff members and refused to wear a mask. The grandson and other family members were going in and out of the patient's room without masks to the clean water and coffee station. Another RN, Ronalee Belts, let them know they needed to wear their masks while on the floor and not to touch this area after exiting the patient's room and these family members ignored him. Security was called and came to the bedside to escort the family off of the floor. The [facility] was notified as well. Took the patient's body down to the morgue at 0640.  At [phone], patient placement was updated about patient's status and the completion of the post-mortem care tab was completed.  At 0800, unit director was given the patient's grandson's message and phone number about wanting to meet with her.

## 2020-06-08 NOTE — Progress Notes (Signed)
RN witnessed family in patients room and in the hallway not wearing masks. Multiple staff including charge nurse repeatedly asked and informed the family of the importance of wearing masks. Family was witnessed leaving patients room and touching "clean" nutrition station, continuously not wearing a mask, coughing and walking back and forth over red zones. The red line is an indication of entering strict personal protective equipment. AC notified.

## 2020-06-08 NOTE — Progress Notes (Signed)
Spoke with the patient's son, also named, Tillman Sers. Calling to make notification of patient expiring. Son was first on contact list and son stated he would notify patient's spouse, Pritesh Sobecki.

## 2020-06-08 NOTE — Progress Notes (Addendum)
Patient pronounced at [place] by this RN and Shon Hough as instructed by Chotiner,MD. No heart or lung sounds noted upon auscultation. Chotiner,MD notified and completed the death certificate. [facility] notified at [employer]. This RN spoke with Gentry Fitz and after entering the patient's info in the database, patient was ruled out as a donor. Referral number is [id number].

## 2020-06-08 DEATH — deceased

## 2020-07-21 ENCOUNTER — Ambulatory Visit: Payer: No Typology Code available for payment source | Admitting: Adult Health
